# Patient Record
Sex: Male | Born: 1945
Health system: Southern US, Community
[De-identification: ages and names within clinical notes are randomized; demographics above are authoritative.]

## PROBLEM LIST (undated history)

## (undated) DIAGNOSIS — F319 Bipolar disorder, unspecified: Secondary | ICD-10-CM

## (undated) DIAGNOSIS — E785 Hyperlipidemia, unspecified: Secondary | ICD-10-CM

## (undated) DIAGNOSIS — G4730 Sleep apnea, unspecified: Secondary | ICD-10-CM

## (undated) DIAGNOSIS — Z720 Tobacco use: Secondary | ICD-10-CM

## (undated) DIAGNOSIS — I10 Essential (primary) hypertension: Secondary | ICD-10-CM

## (undated) DIAGNOSIS — F329 Major depressive disorder, single episode, unspecified: Secondary | ICD-10-CM

## (undated) DIAGNOSIS — F32A Depression, unspecified: Secondary | ICD-10-CM

## (undated) DIAGNOSIS — I951 Orthostatic hypotension: Secondary | ICD-10-CM

## (undated) DIAGNOSIS — K219 Gastro-esophageal reflux disease without esophagitis: Secondary | ICD-10-CM

## (undated) DIAGNOSIS — R55 Syncope and collapse: Secondary | ICD-10-CM

## (undated) HISTORY — PX: HERNIA REPAIR: SHX51

## (undated) HISTORY — PX: COLONOSCOPY: SHX174

---

## 2007-07-11 ENCOUNTER — Emergency Department (HOSPITAL_COMMUNITY): Admission: EM | Admit: 2007-07-11 | Discharge: 2007-07-11 | Payer: Self-pay | Admitting: Emergency Medicine

## 2013-06-13 ENCOUNTER — Encounter (HOSPITAL_COMMUNITY): Payer: Self-pay | Admitting: Emergency Medicine

## 2013-06-13 ENCOUNTER — Emergency Department (HOSPITAL_COMMUNITY): Payer: Medicare Other

## 2013-06-13 ENCOUNTER — Emergency Department (EMERGENCY_DEPARTMENT_HOSPITAL)
Admission: EM | Admit: 2013-06-13 | Discharge: 2013-06-15 | Disposition: A | Discharge status: Other Acute Inpatient Hospital | Payer: Medicare Other | Source: Home / Self Care | Attending: Emergency Medicine | Admitting: Emergency Medicine

## 2013-06-13 DIAGNOSIS — S93401A Sprain of unspecified ligament of right ankle, initial encounter: Secondary | ICD-10-CM

## 2013-06-13 DIAGNOSIS — T1491XA Suicide attempt, initial encounter: Secondary | ICD-10-CM | POA: Diagnosis present

## 2013-06-13 DIAGNOSIS — F329 Major depressive disorder, single episode, unspecified: Secondary | ICD-10-CM | POA: Diagnosis present

## 2013-06-13 DIAGNOSIS — R45851 Suicidal ideations: Secondary | ICD-10-CM

## 2013-06-13 HISTORY — DX: Essential (primary) hypertension: I10

## 2013-06-13 HISTORY — DX: Depression, unspecified: F32.A

## 2013-06-13 HISTORY — DX: Hyperlipidemia, unspecified: E78.5

## 2013-06-13 HISTORY — DX: Major depressive disorder, single episode, unspecified: F32.9

## 2013-06-13 LAB — RAPID URINE DRUG SCREEN, HOSP PERFORMED
Amphetamines: NOT DETECTED
Barbiturates: NOT DETECTED
Opiates: NOT DETECTED
Tetrahydrocannabinol: NOT DETECTED

## 2013-06-13 LAB — CBC
HCT: 38.8 % — ABNORMAL LOW (ref 39.0–52.0)
Hemoglobin: 13.9 g/dL (ref 13.0–17.0)
MCH: 33.5 pg (ref 26.0–34.0)
MCHC: 35.8 g/dL (ref 30.0–36.0)
MCV: 93.5 fL (ref 78.0–100.0)
Platelets: 155 10*3/uL (ref 150–400)
RBC: 4.15 MIL/uL — ABNORMAL LOW (ref 4.22–5.81)
RDW: 12 % (ref 11.5–15.5)
WBC: 7.6 10*3/uL (ref 4.0–10.5)

## 2013-06-13 LAB — COMPREHENSIVE METABOLIC PANEL
ALT: 15 U/L (ref 0–53)
AST: 24 U/L (ref 0–37)
Albumin: 3.8 g/dL (ref 3.5–5.2)
Alkaline Phosphatase: 42 U/L (ref 39–117)
BUN: 19 mg/dL (ref 6–23)
CO2: 25 mEq/L (ref 19–32)
Calcium: 9.6 mg/dL (ref 8.4–10.5)
Chloride: 105 mEq/L (ref 96–112)
Creatinine, Ser: 0.86 mg/dL (ref 0.50–1.35)
GFR calc Af Amer: >90 mL/min (ref >90)
GFR calc non Af Amer: 89 mL/min — ABNORMAL LOW (ref >90)
Glucose, Bld: 122 mg/dL — ABNORMAL HIGH (ref 70–99)
Potassium: 3.3 mEq/L — ABNORMAL LOW (ref 3.5–5.1)
Sodium: 139 mEq/L (ref 135–145)
Total Bilirubin: 0.6 mg/dL (ref 0.3–1.2)
Total Protein: 6.8 g/dL (ref 6.0–8.3)

## 2013-06-13 LAB — URINALYSIS W MICROSCOPIC + REFLEX CULTURE
Bilirubin Urine: NEGATIVE
Ketones, ur: NEGATIVE mg/dL
Leukocytes, UA: NEGATIVE
Nitrite: NEGATIVE
Protein, ur: NEGATIVE mg/dL
Urobilinogen, UA: 1 mg/dL (ref 0.0–1.0)

## 2013-06-13 LAB — ETHANOL: Alcohol, Ethyl (B): <11 mg/dL (ref 0–11)

## 2013-06-13 LAB — ACETAMINOPHEN LEVEL: Acetaminophen (Tylenol), Serum: <15 ug/mL (ref 10–30)

## 2013-06-13 MED ORDER — LORAZEPAM 1 MG PO TABS: 0.0000 mg | ORAL_TABLET | Freq: Four times a day (QID) | ORAL | Status: AC

## 2013-06-13 MED ORDER — LORAZEPAM 1 MG PO TABS: 0.0000 mg | ORAL_TABLET | Freq: Two times a day (BID) | ORAL | Status: DC

## 2013-06-13 MED ORDER — NICOTINE 21 MG/24HR TD PT24
21.0000 mg | MEDICATED_PATCH | Freq: Every day | TRANSDERMAL | Status: DC
  Administered 2013-06-14 – 2013-06-15 (×2): 21 mg via TRANSDERMAL
  Filled 2013-06-13 (×2): qty 1

## 2013-06-13 MED ORDER — ZOLPIDEM TARTRATE 5 MG PO TABS: 5.0000 mg | ORAL_TABLET | Freq: Every evening | ORAL | Status: DC | PRN

## 2013-06-13 MED ORDER — THIAMINE HCL 100 MG/ML IJ SOLN: 100.0000 mg | Freq: Every day | INTRAMUSCULAR | Status: DC

## 2013-06-13 MED ORDER — IBUPROFEN 200 MG PO TABS
600.0000 mg | ORAL_TABLET | Freq: Three times a day (TID) | ORAL | Status: DC | PRN
  Administered 2013-06-14 – 2013-06-15 (×3): 600 mg via ORAL
  Filled 2013-06-13 (×3): qty 3
  Filled 2013-06-13: qty 1

## 2013-06-13 MED ORDER — VITAMIN B-1 100 MG PO TABS
100.0000 mg | ORAL_TABLET | Freq: Every day | ORAL | Status: DC
  Administered 2013-06-13 – 2013-06-15 (×3): 100 mg via ORAL
  Filled 2013-06-13 (×3): qty 1

## 2013-06-13 MED ORDER — ACETAMINOPHEN 325 MG PO TABS: 650.0000 mg | ORAL_TABLET | ORAL | Status: DC | PRN

## 2013-06-13 MED ORDER — ONDANSETRON HCL 4 MG PO TABS: 4.0000 mg | ORAL_TABLET | Freq: Three times a day (TID) | ORAL | Status: DC | PRN
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
## 2013-06-13 NOTE — ED Notes (Signed)
Patient in blue scrubs and red socks. Patient and 1 bag of belongings both wanded by security.
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
## 2013-06-13 NOTE — Progress Notes (Addendum)
Patient ID: Austin Wells, male   DOB: [birth year], 67 y.o.   MRN: [id number] Pt recommended for Inpt therapy by ACT-homeless veteran-recommend he be referred to [facility] program for homeless vets

## 2013-06-13 NOTE — ED Notes (Signed)
Patient transported to X-ray 

## 2013-06-13 NOTE — ED Notes (Signed)
Pharmacy bedside 
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
## 2013-06-13 NOTE — ED Notes (Signed)
Patient has 1 bag of belongings located behind nurses station across from patient's room. Patient states they do not have anything of value in bag.

## 2013-06-14 DIAGNOSIS — F3130 Bipolar disorder, current episode depressed, mild or moderate severity, unspecified: Secondary | ICD-10-CM

## 2013-06-14 MED ORDER — ZOLPIDEM TARTRATE 10 MG PO TABS
10.0000 mg | ORAL_TABLET | Freq: Every evening | ORAL | Status: DC | PRN
  Administered 2013-06-14: 10 mg via ORAL
  Filled 2013-06-14: qty 1

## 2013-06-14 MED ORDER — DIVALPROEX SODIUM 500 MG PO DR TAB
1000.0000 mg | DELAYED_RELEASE_TABLET | Freq: Every day | ORAL | Status: DC
  Administered 2013-06-14: 1000 mg via ORAL
  Filled 2013-06-14: qty 2

## 2013-06-14 MED ORDER — HYDROCHLOROTHIAZIDE 12.5 MG PO CAPS
12.5000 mg | ORAL_CAPSULE | Freq: Every day | ORAL | Status: DC
  Administered 2013-06-14 – 2013-06-15 (×2): 12.5 mg via ORAL
  Filled 2013-06-14 (×2): qty 1

## 2013-06-14 MED ORDER — SIMVASTATIN 20 MG PO TABS
20.0000 mg | ORAL_TABLET | Freq: Every day | ORAL | Status: DC
  Administered 2013-06-14 – 2013-06-15 (×2): 20 mg via ORAL
  Filled 2013-06-14 (×2): qty 1

## 2013-06-14 MED ORDER — LISINOPRIL 40 MG PO TABS
40.0000 mg | ORAL_TABLET | Freq: Every day | ORAL | Status: DC
  Administered 2013-06-14 – 2013-06-15 (×2): 40 mg via ORAL
  Filled 2013-06-14 (×2): qty 1

## 2013-06-14 MED ORDER — VALPROIC ACID 250 MG PO CAPS
500.0000 mg | ORAL_CAPSULE | Freq: Every morning | ORAL | Status: DC
  Administered 2013-06-14 – 2013-06-15 (×2): 500 mg via ORAL
  Filled 2013-06-14 (×2): qty 2

## 2013-06-14 MED ORDER — SALINE SPRAY 0.65 % NA SOLN
2.0000 | NASAL | Status: DC | PRN
  Administered 2013-06-15 (×3): 2 via NASAL
  Filled 2013-06-14: qty 44

## 2013-06-14 MED ORDER — SERTRALINE HCL 50 MG PO TABS
100.0000 mg | ORAL_TABLET | Freq: Every day | ORAL | Status: DC
  Administered 2013-06-14 – 2013-06-15 (×2): 100 mg via ORAL
  Filled 2013-06-14 (×2): qty 2

## 2013-06-14 NOTE — ED Notes (Signed)
Up to the bathroom 

## 2013-06-14 NOTE — ED Provider Notes (Signed)
Medical screening examination/treatment/procedure(s) were conducted as a shared visit with non-physician practitioner(s) and myself.  I personally evaluated the patient during the encounter Please see my previous note.  Laray Anger, DO 06/14/13 1736
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
## 2013-06-14 NOTE — ED Notes (Signed)
1mg  folic acid PO. 1 multivitamin PO given

## 2013-06-14 NOTE — ED Notes (Signed)
Pt  States he has not had medication for bi polar in over a day. He said by tonight you all will not like me and I will not like myself. Pt also stated he takes medication in the morning for HTN.

## 2013-06-14 NOTE — ED Notes (Signed)
Psychiatrist's in w/ pt

## 2013-06-14 NOTE — BHH Counselor (Signed)
AC Tina and TTS [place] instructed T[facility] to contact [facility] and complete [facility] form and attempt to get pt placed or at least on wait list for [place].  Spoke with AOD Ray at 1028.  No beds.  Per AOD, "don't send that form in until Monday around noon. It won't do any good because the info will be old."  TTS responsed " so you want [place] to wait until Monday to contact you about [facility] transfer for said pt?"  AOD responded "Yes."    TTS to follow up with [facility] Monday morning to discuss transfer.  Form not to be filled out until then so it is not two days old per AOD.

## 2013-06-14 NOTE — ED Notes (Signed)
Up tot he bathroom to shower and change scrubs 

## 2013-06-14 NOTE — ED Notes (Signed)
Pt at NS asking for snack, given cheese and crackers.

## 2013-06-14 NOTE — ED Notes (Signed)
Pt reports that he does not drink on a regular basis, but has been having 1-2 drinks, and 1 in the morning to help him sleep.

## 2013-06-14 NOTE — ED Notes (Signed)
Up to the desk on the phone 

## 2013-06-15 ENCOUNTER — Encounter (HOSPITAL_COMMUNITY): Payer: Self-pay | Admitting: Behavioral Health

## 2013-06-15 ENCOUNTER — Encounter (HOSPITAL_COMMUNITY): Payer: Self-pay | Admitting: Registered Nurse

## 2013-06-15 ENCOUNTER — Inpatient Hospital Stay (HOSPITAL_COMMUNITY)
Admission: AD | Admit: 2013-06-15 | Discharge: 2013-06-19 | DRG: 885 | Disposition: A | Discharge status: Home / Self Care | Payer: Medicare Other | Attending: Psychiatry | Admitting: Psychiatry

## 2013-06-15 DIAGNOSIS — T1491XA Suicide attempt, initial encounter: Secondary | ICD-10-CM | POA: Diagnosis present

## 2013-06-15 DIAGNOSIS — Z79899 Other long term (current) drug therapy: Secondary | ICD-10-CM

## 2013-06-15 DIAGNOSIS — F319 Bipolar disorder, unspecified: Secondary | ICD-10-CM | POA: Diagnosis present

## 2013-06-15 DIAGNOSIS — R45851 Suicidal ideations: Secondary | ICD-10-CM

## 2013-06-15 DIAGNOSIS — F329 Major depressive disorder, single episode, unspecified: Secondary | ICD-10-CM | POA: Diagnosis present

## 2013-06-15 DIAGNOSIS — I10 Essential (primary) hypertension: Secondary | ICD-10-CM | POA: Diagnosis present

## 2013-06-15 DIAGNOSIS — E785 Hyperlipidemia, unspecified: Secondary | ICD-10-CM | POA: Diagnosis present

## 2013-06-15 DIAGNOSIS — F3160 Bipolar disorder, current episode mixed, unspecified: Principal | ICD-10-CM

## 2013-06-15 MED ORDER — VITAMIN B-1 100 MG PO TABS
100.0000 mg | ORAL_TABLET | Freq: Every day | ORAL | Status: DC
  Administered 2013-06-16 – 2013-06-19 (×4): 100 mg via ORAL
  Filled 2013-06-15 (×6): qty 1

## 2013-06-15 MED ORDER — LISINOPRIL 40 MG PO TABS
40.0000 mg | ORAL_TABLET | Freq: Every day | ORAL | Status: DC
  Administered 2013-06-16 – 2013-06-19 (×4): 40 mg via ORAL
  Filled 2013-06-15 (×6): qty 1

## 2013-06-15 MED ORDER — MAGNESIUM HYDROXIDE 400 MG/5ML PO SUSP: 30.0000 mL | Freq: Every day | ORAL | Status: DC | PRN

## 2013-06-15 MED ORDER — ACETAMINOPHEN 325 MG PO TABS
650.0000 mg | ORAL_TABLET | Freq: Four times a day (QID) | ORAL | Status: DC | PRN
  Administered 2013-06-16 – 2013-06-19 (×7): 650 mg via ORAL
  Filled 2013-06-15: qty 2

## 2013-06-15 MED ORDER — SALINE SPRAY 0.65 % NA SOLN
2.0000 | NASAL | Status: DC | PRN
  Administered 2013-06-16 – 2013-06-19 (×3): 2 via NASAL
  Filled 2013-06-15: qty 44

## 2013-06-15 MED ORDER — THIAMINE HCL 100 MG/ML IJ SOLN: 100.0000 mg | Freq: Every day | INTRAMUSCULAR | Status: DC

## 2013-06-15 MED ORDER — TRAZODONE HCL 50 MG PO TABS
50.0000 mg | ORAL_TABLET | Freq: Every evening | ORAL | Status: DC | PRN
  Administered 2013-06-15 – 2013-06-17 (×3): 50 mg via ORAL
  Filled 2013-06-15 (×4): qty 1

## 2013-06-15 MED ORDER — NICOTINE 21 MG/24HR TD PT24
21.0000 mg | MEDICATED_PATCH | Freq: Every day | TRANSDERMAL | Status: DC
  Administered 2013-06-16 – 2013-06-19 (×4): 21 mg via TRANSDERMAL
  Filled 2013-06-15 (×7): qty 1

## 2013-06-15 MED ORDER — ALUM & MAG HYDROXIDE-SIMETH 200-200-20 MG/5ML PO SUSP: 30.0000 mL | ORAL | Status: DC | PRN

## 2013-06-15 MED ORDER — SIMVASTATIN 20 MG PO TABS
20.0000 mg | ORAL_TABLET | Freq: Every day | ORAL | Status: DC
  Administered 2013-06-16 – 2013-06-18 (×3): 20 mg via ORAL
  Filled 2013-06-15 (×5): qty 1

## 2013-06-15 MED ORDER — HYDROCHLOROTHIAZIDE 12.5 MG PO CAPS
12.5000 mg | ORAL_CAPSULE | Freq: Every day | ORAL | Status: DC
  Administered 2013-06-16 – 2013-06-19 (×4): 12.5 mg via ORAL
  Filled 2013-06-15 (×6): qty 1

## 2013-06-15 MED ORDER — DIVALPROEX SODIUM 500 MG PO DR TAB
1000.0000 mg | DELAYED_RELEASE_TABLET | Freq: Every day | ORAL | Status: DC
  Administered 2013-06-15 – 2013-06-16 (×2): 1000 mg via ORAL
  Filled 2013-06-15 (×5): qty 2

## 2013-06-15 MED ORDER — VALPROIC ACID 250 MG PO CAPS
500.0000 mg | ORAL_CAPSULE | Freq: Every morning | ORAL | Status: DC
  Filled 2013-06-15: qty 2

## 2013-06-15 NOTE — ED Notes (Signed)
Snack given.

## 2013-06-15 NOTE — ED Notes (Addendum)
Pt ambulatory to [facility] W/ [facility], 1 BAG OF BELONGINGS given to [employer]
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
## 2013-06-15 NOTE — ED Notes (Signed)
Up to the bathroom 

## 2013-06-15 NOTE — ED Notes (Signed)
Pt is aware that he will transfer to [facility] after supper
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
## 2013-06-15 NOTE — ED Notes (Signed)
Pt has showered  And changed scrubs

## 2013-06-15 NOTE — ED Notes (Signed)
[employer] here to transport 

## 2013-06-15 NOTE — ED Notes (Signed)
Up to the desk ambulatory w/o difficulty. Talking/jioking w/ other patient.  Pt  reports that his ankle is hurting worse 7/10.

## 2013-06-15 NOTE — ED Notes (Signed)
Up to the desk on the phone 

## 2013-06-15 NOTE — ED Notes (Signed)
[facility] NP and psych MD in w/ pt

## 2013-06-15 NOTE — Progress Notes (Signed)
Follow up Progress Note: Face to face interview and consult with   Austin BaccoApril 15, [id number]  Subjective:  Patient states that he was trying to kill himself when he tied the belt around his neck.  Patient states that he is feeling better today.  Patient states since he is taking his medication he is feeling better. Patient is unsure [facility].  Patient is homeless and lives in his car, but states that he will be able to go home with his ex-wife until he is stable.  Patient denies suicidal ideation, homicidal ideation, psychosis, and paranoia at this time.   Will continue to monitor patient overnight.  Will be able to send forms to the [place] on Monday morning.  Once [facility] has been [facility] determine if outpatient of inpatient treatment is better for this patient.  Will continue to monitor for improvement in depression and Suicidal ideation.     Current Medication Current facility-administered medications:acetaminophen (TYLENOL) tablet 650 mg, 650 mg, Oral, Q4H PRN, Tatyana A Kirichenko, PA-C;  divalproex (DEPAKOTE) DR tablet 1,000 mg, 1,000 mg, Oral, QHS, Earney Navy, NP, 1,000 mg at 06/14/13 2249;  hydrochlorothiazide (MICROZIDE) capsule 12.5 mg, 12.5 mg, Oral, Daily, Earney Navy, NP, 12.5 mg at 06/15/13 0945 ibuprofen (ADVIL,MOTRIN) tablet 600 mg, 600 mg, Oral, Q8H PRN, Tatyana A Kirichenko, PA-C, 600 mg at 06/15/13 0804;  lisinopril (PRINIVIL,ZESTRIL) tablet 40 mg, 40 mg, Oral, Daily, Earney Navy, NP, 40 mg at 06/15/13 0945;  LORazepam (ATIVAN) tablet 0-4 mg, 0-4 mg, Oral, Q6H, Tatyana A Kirichenko, PA-C;  LORazepam (ATIVAN) tablet 0-4 mg, 0-4 mg, Oral, Q12H, Tatyana A Kirichenko, PA-C nicotine (NICODERM CQ - dosed in mg/24 hours) patch 21 mg, 21 mg, Transdermal, Daily, Tatyana A Kirichenko, PA-C, 21 mg at 06/15/13 0943;  ondansetron (ZOFRAN) tablet 4 mg, 4 mg, Oral, Q8H PRN, Tatyana A Kirichenko, PA-C;  sertraline (ZOLOFT) tablet 100 mg, 100  mg, Oral, Daily, Earney Navy, NP, 100 mg at 06/15/13 0945;  simvastatin (ZOCOR) tablet 20 mg, 20 mg, Oral, q1800, Earney Navy, NP, 20 mg at 06/14/13 1825 sodium chloride (OCEAN) 0.65 % nasal spray 2 spray, 2 spray, Each Nare, PRN, Raeford Razor, MD, 2 spray at 06/15/13 1210;  thiamine (B-1) injection 100 mg, 100 mg, Intravenous, Daily, Tatyana A Kirichenko, PA-C;  thiamine (VITAMIN B-1) tablet 100 mg, 100 mg, Oral, Daily, Tatyana A Kirichenko, PA-C, 100 mg at 06/15/13 0945;  valproic acid (DEPAKENE) 250 MG capsule 500 mg, 500 mg, Oral, q morning - 10a, Josephine C Onuoha, NP, 500 mg at 06/15/13 0945 zolpidem (AMBIEN) tablet 10 mg, 10 mg, Oral, QHS PRN, Earney Navy, NP, 10 mg at 06/14/13 2249 Current outpatient prescriptions:divalproex (DEPAKOTE ER) 500 MG 24 hr tablet, Take 500-1,000 mg by mouth daily. 1 tablet in the morning and 2 tablets at bedtime, Disp: , Rfl: ;  fish oil-omega-3 fatty acids 1000 MG capsule, Take 2 g by mouth daily., Disp: , Rfl: ;  hydrochlorothiazide (HYDRODIURIL) 25 MG tablet, Take 12.5 mg by mouth daily. , Disp: , Rfl: ;  lisinopril (PRINIVIL,ZESTRIL) 40 MG tablet, Take 40 mg by mouth daily., Disp: , Rfl:  Multiple Vitamin (MULTIVITAMIN WITH MINERALS) TABS tablet, Take 1 tablet by mouth daily., Disp: , Rfl: ;  pravastatin (PRAVACHOL) 40 MG tablet, Take 40 mg by mouth every evening. , Disp: , Rfl: ;  salicylic acid-lactic acid 17 % external solution, Apply 1 application topically daily as needed (for wart). Applied to wart,  Disp: , Rfl: ;  sertraline (ZOLOFT) 100 MG tablet, Take 100 mg by mouth every evening. , Disp: , Rfl:  sodium chloride (OCEAN) 0.65 % nasal spray, Place 2 sprays into the nose 3 (three) times daily as needed for congestion., Disp: , Rfl: ;  traMADol (ULTRAM) 50 MG tablet, Take 50 mg by mouth every 6 (six) hours as needed for pain., Disp: , Rfl: ;  zolpidem (AMBIEN) 10 MG tablet, Take 10 mg by mouth at bedtime as needed for sleep., Disp: , Rfl:   Assessment @ Axis I: Bipolar, Depressed Axis II: Deferred Axis III:  Past Medical History  Diagnosis Date  . Hyperlipidemia   . Depression   . Hypertension    Axis IV: economic problems, housing problems, occupational problems, other psychosocial or environmental problems, problems related to social environment and problems with primary support group Axis V: 51-60 moderate symptoms  Plan  Recommendation/Disposition:  Monitor overnight until CW hears for [facility] and re-evaluate for disposition after.    Patient informs that he is unable to get to [facility] for [facility] at the [facility] even if accepted.  Patient accepted at [facility] [address] . 1. Admit for crisis management and stabilization.  2. Review and initiate  medications pertinent to patient illness and treatment.  3. Medication management to reduce current symptoms to base line and improve the         patient's overall level of functioning.   Shuvon Rankin, FNP-BC  I have personally seen the patient and agreed with the findings and involved in the treatment plan. Thresa Ross, MD

## 2013-06-15 NOTE — Tx Team (Signed)
Initial Interdisciplinary Treatment Plan  PATIENT STRENGTHS: (choose at least two) Active sense of humor Average or above average intelligence Communication skills Physical Health Religious Affiliation Work skills  PATIENT STRESSORS: Financial difficulties Marital or family conflict Medication change or noncompliance Substance abuse   PROBLEM LIST: Problem List/Patient Goals Date to be addressed Date deferred Reason deferred Estimated date of resolution  Risky behaviors 06/15/2013   D/C  Medication noncompliance 06/15/2013   D/C  Substance abuse/ETOH 06/15/2013   D/C                                       DISCHARGE CRITERIA:  Ability to meet basic life and health needs Adequate post-discharge living arrangements Improved stabilization in mood, thinking, and/or behavior Motivation to continue treatment in a less acute level of care Reduction of life-threatening or endangering symptoms to within safe limits Verbal commitment to aftercare and medication compliance  PRELIMINARY DISCHARGE PLAN: Outpatient therapy  PATIENT/FAMIILY INVOLVEMENT: This treatment plan has been presented to and reviewed with the patient, Austin Wells, and/or family member.  The patient and family have been given the opportunity to ask questions and make suggestions.  [name], [name] Joy 06/15/2013, 8:53 PM
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
## 2013-06-16 DIAGNOSIS — F3160 Bipolar disorder, current episode mixed, unspecified: Principal | ICD-10-CM

## 2013-06-16 DIAGNOSIS — F319 Bipolar disorder, unspecified: Secondary | ICD-10-CM | POA: Diagnosis present

## 2013-06-16 MED ORDER — DIVALPROEX SODIUM 500 MG PO DR TAB
500.0000 mg | DELAYED_RELEASE_TABLET | Freq: Every day | ORAL | Status: DC
  Administered 2013-06-16 – 2013-06-17 (×2): 500 mg via ORAL
  Filled 2013-06-16 (×4): qty 1

## 2013-06-16 NOTE — BHH Group Notes (Signed)
[facility] LCSW Aftercare Discharge Planning Group Note   06/16/2013 12:51 PM    Participation Quality:  Appropraite  Mood/Affect:  Appropriate  Depression Rating:  0  Anxiety Rating:  3  Thoughts of Suicide:  No  Will you contract for safety?   NA  Current AVH:  No  Plan for Discharge/Comments:  Patient attending discharge planning group and actively participated in group.  He advised of need for referral for outpatient services.  He shared he plans to stay with a friend at discharge.  CSW provided all participants with daily workbook and information on services offered by [facility].   Transportation Means: Patient has transportation.   Supports:  Patient has a support system.   Mishel Sans, Joesph July

## 2013-06-16 NOTE — Progress Notes (Signed)
Adult Psychoeducational Group Note  Date:  06/16/2013 Time:  10:20 PM  Group Topic/Focus:  Wrap-Up Group:   The focus of this group is to help patients review their daily goal of treatment and discuss progress on daily workbooks.  Participation Level:  Active  Participation Quality:  Appropriate, Attentive and Supportive  Affect:  Appropriate  Cognitive:  Alert and Appropriate  Insight: Appropriate and Good  Engagement in Group:  Engaged and Improving  Modes of Intervention:  Discussion and Support  Additional Comments:  Pt described an ideal day as laying with his dog in a hammock. Pt gave positive and affirming feedback to pts who are discharging in the am.  Reynolds Bowl 06/16/2013, 10:20 PM

## 2013-06-16 NOTE — BHH Counselor (Signed)
Adult Comprehensive Assessment  Patient ID: Austin Wells, male   DOB: 1946-02-21, 67 y.o.   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 Family Relationships: Patient and wife separated two months ago Surveyor, quantity / Lack of resources (include bankruptcy): None Housing / Lack of housing: Patient will stay with a friend Physical health (include injuries & life threatening diseases):  Bad ankle Social relationships: None Substance abuse: Drinking some alcohol but not to excess Bereavement / Loss: None  Living/Environment/Situation:  Living Arrangements: Non-relatives/Friends Living conditions (as described by patient or guardian): Okay How long has patient lived in current situation?: Patient will be staying with friends on discharge. What is atmosphere in current home: Comfortable  Family History:  Marital status: Separated Number of Years Married: 1 Separated, when?: Patient and wife separated for two months What types of issues is patient dealing with in the relationship?: Wife concerned about patient irratic behaviors Does patient have children?: Yes How many children?: 2 How is patient's relationship with their children?: Estranged from son in [place] and close to other son in [place]  Childhood History:  By whom was/is the patient raised?: Both parents Additional childhood history information: Good childhood.  Patient reports being in a hospital for three and a half years due to having TB Description of patient's relationship with caregiver when they were a child: Did not see eye to eye wiyth father but fine with mother Patient's description of current relationship with people who raised him/her: Good relationship with mother Does patient have siblings?: Yes Number of Siblings: 1 Description of patient's current relationship  with siblings: Not a close relationship with sister Did patient suffer any verbal/emotional/physical/sexual abuse as a child?: No Did patient suffer from severe childhood neglect?: No Has patient ever been sexually abused/assaulted/raped as an adolescent or adult?: No Was the patient ever a victim of a crime or a disaster?: No Witnessed domestic violence?: No Has patient been effected by domestic violence as an adult?: No  Education:  Highest grade of school patient has completed: [employer] Currently a student?: No Learning disability?: No  Employment/Work Situation:   Employment situation: On disability (Patient is retired but works a little) Why is patient on disability: Bipolar How long has patient been on disability: 2009 Patient's job has been impacted by current illness: No What is the longest time patient has a held a job?: Five years 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, [employer]ing [employer] Has patient ever served in Buyer, retail?: No 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 Resources:   Financial resources: Receives SSI Does patient have a Lawyer or guardian?: No  Alcohol/Substance Abuse:   What has been your use of drugs/alcohol within the last 12 months?: Two small bottles (airplane size) bottle of liquor over the past two months If attempted suicide, did drugs/alcohol play a role in this?: No Alcohol/Substance Abuse Treatment Hx: Past Tx, Inpatient If yes, describe [facility] [place] [facility] Has alcohol/substance abuse ever caused legal problems?: No  Social Support System:   Forensic psychologist System: None Type of faith/religion: None How does patient's faith help to cope with current illness?: N/A  Leisure/Recreation:   Leisure and Hobbies: Working with hands - makes cane and walking sticks  Strengths/Needs:   What things does the patient do well?: 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 In what  areas does patient struggle / problems for patient: Taking care of self - emotionally  Discharge Plan:   Does patient have access to transportation?: Yes Will patient be returning to same living situation after discharge?: No Plan for living situation after discharge: Patient will be living with a friend Currently receiving community mental health services: Yes (From Whom) ([facility] [place]) If no, would patient like referral for services when discharged?: Yes (What county?) [facility] [place]) Does patient have financial barriers related to discharge medications?: No  Summary/Recommendations:  Austin Wells is a 67 year old male admitted with Bipolar Disorder.  He will benefit from crisis stabilization, evaluation for medication, psycho-education groups for coping skills development, group therapy and case management for discharge planning.     Austin Wells, Austin Wells. 06/16/2013
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
## 2013-06-16 NOTE — Progress Notes (Signed)
NUTRITION ASSESSMENT  Pt identified as at risk on the Malnutrition Screen Tool  INTERVENTION: 1. Educated patient on the importance of nutrition and encouraged intake of food and beverages. 2. Discussed weight goals. 3. Supplements: continue thiamine daily.    NUTRITION DIAGNOSIS: Unintentional weight loss related to sub-optimal intake as evidenced by pt report.   Goal: Pt to meet >/= 90% of their estimated nutrition needs.  Monitor:  PO intake  Assessment:  Patient admitted after SA.  Hx of bipolar.  Homeless for the past 5 weeks after argument wife and living in his car.  Decreased appetite with 45 lb weight loss in the past year.  Patient reported weight loss was intentional secondary to MD's recommendations.  Probable further weight loss over the past weeks secondary to homeless.  Patient reports very good intake currently.    67 y.o. male  Height: Ht Readings from Last 1 Encounters:  06/15/13 5' 10.08" (1.78 m)    Weight: Wt Readings from Last 1 Encounters:  06/15/13 187 lb (84.823 kg)    Weight Hx: Wt Readings from Last 10 Encounters:  06/15/13 187 lb (84.823 kg)    BMI:  Body mass index is 26.77 kg/(m^2). Pt meets criteria for overweight based on current BMI.  Estimated Nutritional Needs: Kcal: 25-30 kcal/kg Protein: > 1 gram protein/kg Fluid: 1 ml/kcal  Diet Order: General Pt is also offered choice of unit snacks mid-morning and mid-afternoon.  Pt is eating as desired.   Lab results and medications reviewed.   Oran Rein, RD, LDN Clinical Inpatient Dietitian Pager:  [phone] Weekend and after hours pager:  [phone]

## 2013-06-16 NOTE — Progress Notes (Signed)
The focus of this group is to help patients review their daily goal of treatment and discuss progress on daily workbooks.  Cherry did not attend wrap up group tonight.
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
## 2013-06-16 NOTE — Progress Notes (Signed)
D: Patient resting in bed with eyes closed.  Respirations even and unlabored.  Patient appears to be in no apparent distress. A: Staff to monitor Q 15 mins for safety.   R:Patient remains safe on the unit.  

## 2013-06-16 NOTE — [facility] Suicide Risk Assessment (Signed)
Suicide Risk Assessment  Admission Assessment     Nursing information obtained from:  Patient Demographic factors:  Age 65 or older;Divorced or widowed;Low socioeconomic status;Living alone;Unemployed;Caucasian Current Mental Status:  Suicidal ideation indicated by patient Loss Factors:  Loss of significant relationship;Financial problems / change in socioeconomic status Historical Factors:  Impulsivity Risk Reduction Factors:  Religious beliefs about death  CLINICAL FACTORS:   Severe Anxiety and/or Agitation Bipolar Disorder:   Depressive phase Depression:   Anhedonia Comorbid alcohol abuse/dependence Hopelessness Impulsivity Insomnia Recent sense of peace/wellbeing Severe Alcohol/Substance Abuse/Dependencies Unstable or Poor Therapeutic Relationship Previous Psychiatric Diagnoses and Treatments Medical Diagnoses and Treatments/Surgeries  COGNITIVE FEATURES THAT CONTRIBUTE TO RISK:  Closed-mindedness Loss of executive function Polarized thinking Thought constriction (tunnel vision)    SUICIDE RISK:   Moderate:  Frequent suicidal ideation with limited intensity, and duration, some specificity in terms of plans, no associated intent, good self-control, limited dysphoria/symptomatology, some risk factors present, and identifiable protective factors, including available and accessible social support.  PLAN OF CARE: Admit in one week and emergently from the [facility] long emergency department for bipolar disorder with the recent depression and suicidal attempt. Patient may crisis stabilization medication management.  I certify that inpatient services furnished can reasonably be expected to improve the patient's condition.   Nehemiah Settle., MD 06/16/2013, 2:26 PM

## 2013-06-16 NOTE — Progress Notes (Signed)
Recreation Therapy Notes  Date: 08.25.2014 Time: 3:00pm Location: 500 Hall Dayroom  Group Topic: Stress Management  Goal Area(s) Addresses:  Patient will verbalize importance of using healthy stress management.  Patient will identify stress management technique of choice.  Behavioral Response: Attentive, Appropriate    Intervention: Stress Management  Activity: Activity: Deep Breathing & Progressive Muscle Relaxation. LRT instructed patients on completing Progressive Muscle Relaxation. LRT instructed patients on completing deep belly breathing.   Education:  Pharmacologist, Discharge Planning  Education Outcome: Acknowledges understanding  Clinical Observations/Feedback: Patient actively participated in both deep breathing and progressive muscle relaxation. Patient expressed no discomfort during either technique. Patient made no contributions to wrap up discussion, but appeared to actively listen as he maintained appropriate eye contact with speaker, as well as nodding in agreement with points of interest.    Jearl Klinefelter, LRT/CTRS  Jearl Klinefelter 06/16/2013 4:08 PM

## 2013-06-17 LAB — VALPROIC ACID LEVEL: Valproic Acid Lvl: 39.3 ug/mL — ABNORMAL LOW (ref 50.0–100.0)

## 2013-06-17 MED ORDER — DIVALPROEX SODIUM 500 MG PO DR TAB
1000.0000 mg | DELAYED_RELEASE_TABLET | Freq: Two times a day (BID) | ORAL | Status: DC
  Administered 2013-06-17 – 2013-06-19 (×4): 1000 mg via ORAL
  Filled 2013-06-17 (×6): qty 2

## 2013-06-17 NOTE — Progress Notes (Signed)
Adult Psychoeducational Group Note  Date:  06/17/2013 Time:  11:16 PM  Group Topic/Focus:  Goals Group:   The focus of this group is to help patients establish daily goals to achieve during treatment and discuss how the patient can incorporate goal setting into their daily lives to aide in recovery.  Participation Level:  Active  Participation Quality:  Appropriate  Affect:  Appropriate  Cognitive:  Appropriate  Insight: Appropriate  Engagement in Group:  Engaged  Modes of Intervention:  Discussion  Additional Comments:  Pt stated that the day went well, groups, other patients, and the good atmosphere that everyone else provided made his day better.  Terie Purser R 06/17/2013, 11:16 PM

## 2013-06-17 NOTE — Progress Notes (Signed)
Recreation Therapy Notes  Date: 08.26.2014 Time: 2:45pm Location: 500 Hall Dayroom  Group Topic: Animal Assisted Activities  Goal Area(s) Addresses:  Patient will interact appropriately with dog team.    Behavioral Response: Appropriate, Attentive, Engaged  Education: Coping Skill   Education Outcome: Acknowledges understanding  Clinical Observations/Feedback: Dog Team: [employer]. Patient interacted appropriately with peer, dog team and LRT.    Marykay Lex Talene Glastetter, LRT/CTRS  Jearl Klinefelter 06/17/2013 4:49 PM

## 2013-06-17 NOTE — Progress Notes (Signed)
Patient ID: Austin Wells, male   DOB: [birth year], 67 y.o.   MRN: [id number] D-Patient reports he slept well and that his appetite is good.  His ability to pay attention is also good.  He rates his depression and hopelessness at 1/10 and his anxiety at 0.  He denies thoughts of self harm.  He has ankle pain but no other complaints.  He is attending groups and interacting with peers and staff.  He is asking when he can expect to be discharged and says he plans to take medication as ordered after D/C

## 2013-06-17 NOTE — Progress Notes (Signed)
Adult Psychoeducational Group Note  Date:  06/17/2013 Time:  12:34 PM  Group Topic/Focus:  Recovery Goals:   The focus of this group is to identify appropriate goals for recovery and establish a plan to achieve them.  Participation Level:  Active  Participation Quality:  Appropriate, Attentive and Supportive  Affect:  Appropriate  Cognitive:  Alert and Appropriate  Insight: Good  Engagement in Group:  Engaged and Supportive  Modes of Intervention:  Discussion, Socialization and Support  Additional Comments:  Pt came to group and was very supportive of the other group members. Pt shared that the negative relationship with his ex and his pride are standing between him and recovery. Pt plans on changing this by working on his relationship with his ex and putting himself first and reevaluating his attitudes and going to therapy and using the resources at the [place].   Cathlean Cower 06/17/2013, 12:34 PM

## 2013-06-17 NOTE — Progress Notes (Signed)
The focus of this group is to educate the patient on the purpose and policies of crisis stabilization and provide a format to answer questions about their admission.  The group details unit policies and expectations of patients while admitted.  Patient attended 0900 nurse education orientation group this morning.  Patient actively participated, appropriate affect, alert, appropriate insight and engagement.  Patient will work on goals for discharge today. 

## 2013-06-17 NOTE — Progress Notes (Signed)
[facility] MD Progress Note  06/17/2013 2:18 PM Austin Wells  MRN:  [id number]  Subjective:  "I'm wonderful!" I know I'm wonderful because I'm attending groups, I'm getting in touch with my feelings, and I'm getting the help I need. Patient has been up and active in the unit milieu and attending groups. He has been euphoric and grandiose today in his behaviors. He has been more manic but not depressed or anxious. He reports depression as a 1/10 and and his anxiety is a 1/10.   Diagnosis:   DSM5: Schizophrenia Disorders:  Obsessive-Compulsive Disorders:  Trauma-Stressor Disorders:  Substance/Addictive Disorders:  Depressive Disorders: Disruptive Mood Dysregulation Disorder (296.99)  AXIS I: Bipolar, mixed  AXIS II: Deferred  AXIS III:  Past Medical History   Diagnosis  Date   .  Hyperlipidemia    .  Depression    .  Hypertension     AXIS IV: economic problems, housing problems, occupational problems, other psychosocial or environmental problems, problems related to social environment and problems with primary support group  AXIS V: 41-50 serious symptoms  ADL's:  Intact  Sleep: Good  Appetite:  "3 squares a day."  Suicidal Ideation:  Continue to be suicidal with no plan or intent and contract for safety Homicidal Ideation:  denies AEB (as evidenced by):  Psychiatric Specialty Exam: Review of Systems  Constitutional: Negative.  Negative for fever, chills, weight loss, malaise/fatigue and diaphoresis.  HENT: Negative for congestion and sore throat.   Eyes: Negative for blurred vision, double vision and photophobia.  Respiratory: Negative for cough, shortness of breath and wheezing.   Cardiovascular: Negative for chest pain, palpitations and PND.  Gastrointestinal: Negative for heartburn, nausea, vomiting, abdominal pain, diarrhea and constipation.  Musculoskeletal: Positive for joint pain (ankle pain). Negative for myalgias and falls.  Neurological: Negative for dizziness,  tingling, tremors, sensory change, speech change, focal weakness, seizures, loss of consciousness, weakness and headaches.  Endo/Heme/Allergies: Negative for polydipsia. Does not bruise/bleed easily.  Psychiatric/Behavioral: Negative for depression, suicidal ideas, hallucinations, memory loss and substance abuse. The patient is not nervous/anxious and does not have insomnia.     Blood pressure 126/83, pulse 70, temperature 96.8 F (36 C), temperature source Oral, resp. rate 17, height 5' 10.08" (1.78 m), weight 84.823 kg (187 lb).Body mass index is 26.77 kg/(m^2).  General Appearance: Casual  Eye Contact::  Fair  Speech:  Pressured  Volume:  Normal  Mood:  Euthymic  Affect:  grandiose  Thought Process:  Goal Directed  Orientation:  Full (Time, Place, and Person)  Thought Content:  WDL  Suicidal Thoughts:  Yes.  without intent/plan  Homicidal Thoughts:  No  Memory:  Immediate;   Fair  Judgement:  Impaired  Insight:  Lacking  Psychomotor Activity:  Restlessness  Concentration:  Fair  Recall:  Fair  Akathisia:  No  Handed:  Right  AIMS (if indicated):     Assets:  Communication Skills Desire for Improvement  Sleep:  Number of Hours: 5   Current Medications: Current Facility-Administered Medications  Medication Dose Route Frequency Provider Last Rate Last Dose  . acetaminophen (TYLENOL) tablet 650 mg  650 mg Oral Q6H PRN Shuvon Rankin, NP   650 mg at 06/17/13 0757  . alum & mag hydroxide-simeth (MAALOX/MYLANTA) 200-200-20 MG/5ML suspension 30 mL  30 mL Oral Q4H PRN Shuvon Rankin, NP      . divalproex (DEPAKOTE) DR tablet 1,000 mg  1,000 mg Oral QHS Shuvon Rankin, NP   1,000 mg at 06/16/13 2133  .  divalproex (DEPAKOTE) DR tablet 500 mg  500 mg Oral Daily Nehemiah Settle, MD   500 mg at 06/17/13 0756  . hydrochlorothiazide (MICROZIDE) capsule 12.5 mg  12.5 mg Oral Daily Shuvon Rankin, NP   12.5 mg at 06/17/13 0755  . lisinopril (PRINIVIL,ZESTRIL) tablet 40 mg  40 mg Oral  Daily Shuvon Rankin, NP   40 mg at 06/17/13 0755  . magnesium hydroxide (MILK OF MAGNESIA) suspension 30 mL  30 mL Oral Daily PRN Shuvon Rankin, NP      . nicotine (NICODERM CQ - dosed in mg/24 hours) patch 21 mg  21 mg Transdermal Daily Shuvon Rankin, NP   21 mg at 06/17/13 0658  . simvastatin (ZOCOR) tablet 20 mg  20 mg Oral q1800 Shuvon Rankin, NP   20 mg at 06/16/13 1658  . sodium chloride (OCEAN) 0.65 % nasal spray 2 spray  2 spray Each Nare PRN Shuvon Rankin, NP   2 spray at 06/16/13 1308  . thiamine (VITAMIN B-1) tablet 100 mg  100 mg Oral Daily Shuvon Rankin, NP   100 mg at 06/17/13 0756   Or  . thiamine (B-1) injection 100 mg  100 mg Intravenous Daily Shuvon Rankin, NP      . traZODone (DESYREL) tablet 50 mg  50 mg Oral QHS PRN,MR X 1 Court Joy, PA-C   50 mg at 06/16/13 2133    Lab Results:  Results for orders placed during the hospital encounter of 06/15/13 (from the past 48 hour(s))  VALPROIC ACID LEVEL     Status: Abnormal   Collection Time    06/17/13  6:10 AM      Result Value Range   Valproic Acid Lvl 39.3 (*) 50.0 - 100.0 ug/mL   Comment: Performed at [facility]  TSH     Status: None   Collection Time    06/17/13  6:10 AM      Result Value Range   TSH 1.569  0.350 - 4.500 uIU/mL   Comment: Performed at [facility]    Physical Findings: AIMS: Facial and Oral Movements Muscles of Facial Expression: None, normal Lips and Perioral Area: None, normal Jaw: None, normal Tongue: None, normal,Extremity Movements Upper (arms, wrists, hands, fingers): None, normal Lower (legs, knees, ankles, toes): None, normal, Trunk Movements Neck, shoulders, hips: None, normal, Overall Severity Severity of abnormal movements (highest score from questions above): None, normal Incapacitation due to abnormal movements: None, normal Patient's awareness of abnormal movements (rate only patient's report): No Awareness, Dental Status Current problems with teeth and/or  dentures?: No Does patient usually wear dentures?: No  CIWA:    COWS:  COWS Total Score: 0  Treatment Plan Summary: Daily contact with patient to assess and evaluate symptoms and progress in treatment Medication management  Plan: 1. Patient VPA acid level is sub-therapeutic and Will increase Depakote to 1000mg  Po BID for better control of his manic symptoms. 2. Continue other plans as directed. 3. Anticipate D/C to friend's house upon discharge. 4. ELOS: 2-3 days.  Medical Decision Making Problem Points:  Established problem, stable/improving (1) Data Points:  Review of medication regiment & side effects (2)  I certify that inpatient services furnished can reasonably be expected to improve the patient's condition.   Rona Ravens. [place] RPAC 2:28 PM 06/17/2013  Reviewed the information documented and agree with the treatment plan.  Nehemiah Settle., MD 06/17/2013 9:30 PM

## 2013-06-17 NOTE — [facility] Group Notes (Signed)
[facility] LCSW Group Therapy      Feelings About Diagnosis 1:15 - 2:30 PM         06/17/2013  12:52 PM    Type of Therapy:  Group Therapy  Participation Level:  Active  Participation Quality:  Appropriate  Affect:  Appropriate  Cognitive:  Alert and Appropriate  Insight:  Developing/Improving and Engaged  Engagement in Therapy:  Developing/Improving and Engaged  Modes of Intervention:  Discussion, Education, Exploration, Problem-Solving, Rapport Building, Support  Summary of Progress/Problems:  Patient actively participated in group. Patient discussed past and present diagnosis and the effects it has had on  life.  Patient talked about family and society being judgmental and the stigma associated with having a mental health diagnosis.  Patient shared he has to take responsibility for things going bad for him because he did not take his medications as needed.  Wynn Banker 06/17/2013  12:52 PM

## 2013-06-17 NOTE — Clinical Social Work Note (Signed)
Writer spoke with patient's wife advised patient is not to return to her home at discharge.  She reports she has had enough.

## 2013-06-17 NOTE — Progress Notes (Signed)
Patient ID: Austin Wells, male   DOB: 10-Jun-1946, 67 y.o.   MRN: [id number] Patient currently asleep; no s/s of distress noted at this time. Respirations regular and unlabored.

## 2013-06-17 NOTE — [facility] Suicide Risk Assessment (Signed)
[facility] INPATIENT:  Family/Significant Other Suicide Prevention Education  Suicide Prevention Education:  Education Completed; Austin Wells, Wife, [phone];  has been identified by the patient as the family member/significant other with whom the patient will be residing, and identified as the person(s) who will aid the patient in the event of a mental health crisis (suicidal ideations/suicide attempt).  With written consent from the patient, the family member/significant other has been provided the following suicide prevention education, prior to the and/or following the discharge of the patient.  The suicide prevention education provided includes the following:  Suicide risk factors  Suicide prevention and interventions  National Suicide Hotline telephone number  [facility] assessment telephone number  [facility] Emergency Assistance 911  [facility] and/or Residential Mobile Crisis Unit telephone number  Request made of family/significant other to:  Remove weapons (e.g., guns, rifles, knives), all items previously/currently identified as safety concern.  Wife does not know if patient still has guns.  She reports had guns in June.  Remove drugs/medications (over-the-counter, prescriptions, illicit drugs), all items previously/currently identified as a safety concern.  The family member/significant other verbalizes understanding of the suicide prevention education information provided.  The family member/significant other agrees to remove the items of safety concern listed above.  Wynn Banker 06/17/2013, 11:39 AM
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
## 2013-06-18 NOTE — Progress Notes (Signed)
Adult Psychoeducational Group Note  Date:  06/18/2013 Time:  10:53 AM  Group Topic/Focus:  Personal Choices and Values:   The focus of this group is to help patients assess and explore the importance of values in their lives, how their values affect their decisions, how they express their values and what opposes their expression.  Participation Level:  Active  Participation Quality:  Appropriate  Affect:  Appropriate  Cognitive:  Alert and Appropriate  Insight: Appropriate  Engagement in Group:  Engaged  Modes of Intervention:  Discussion  Additional Comments:  Today's group was a therapeutic activity. Pt attended group and was very engaged.    [place] Shi K 06/18/2013, 10:53 AM
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
## 2013-06-18 NOTE — [facility] Group Notes (Signed)
[facility] LCSW Aftercare Discharge Planning Group Note   06/18/2013 9:43 AM    Participation Quality:  Appropraite  Mood/Affect:  Appropriate  Depression Rating:  0  Anxiety Rating:  0  Thoughts of Suicide:  No  Will you contract for safety?   NA  Current AVH:  No  Plan for Discharge/Comments:  Patient attending discharge planning group and actively participated in group.  Patient reports being much better today and hopes to discharge today.  He will follow up with [facility] for medication management and [facility] for counseling.  CSW provided all participants with daily workbook and information on services offered by [facility].   Transportation Means: Patient has transportation.   Supports:  Patient has a support system.   Verginia Toohey, [place] July

## 2013-06-18 NOTE — Progress Notes (Signed)
Adult Psychoeducational Group Note  Date:  06/18/2013 Time:  9:46 PM  Group Topic/Focus:  Wrap-Up Group:   The focus of this group is to help patients review their daily goal of treatment and discuss progress on daily workbooks.  Participation Level:  Active  Participation Quality:  Appropriate  Affect:  Appropriate  Cognitive:  Alert  Insight: Appropriate  Engagement in Group:  Engaged  Modes of Intervention:  Discussion  Additional Comments:  Pt stated that this was the first day that he enjoyed all the groups. He feels that he is progressively getting better.   Flonnie Hailstone 06/18/2013, 9:46 PM

## 2013-06-18 NOTE — Progress Notes (Signed)
Recreation Therapy Notes  Date: 08.27.2014 Time: 3:00pm Location: [address] Dayroom  Group Topic: Leisure Education  Goal Area(s) Addresses:  Patient will verbalize activity of interest by end of group session. Patient will verbalize the ability to use positive leisure/recreation as a coping mechanism.  Behavioral Response: Appropriate, Engaged  Intervention: Air traffic controller  Activity: Leisure Time Management. Patients were given an worksheet outlining approximately 16 hours of the day, using various colors patients were asked to identify how they use their time. For example red = time at work, blue = time for self-care.   Education:  Discharge Planning.   Education Outcome: Acknowledges understanding  Clinical Observations/Feedback: Patient arrived to group session late, upon arriving patient immediately engaged in activity. Approximately 5 minutes after arriving to session PA asked patient to leave session. Patient returned as group session was wrapping up. Patient finished working on worksheet, but was not able to process with LRT or add to group discussion.    Marykay Lex Nadia Viar, LRT/CTRS  Jearl Klinefelter 06/18/2013 4:13 PM

## 2013-06-18 NOTE — Progress Notes (Signed)
Patient ID: Austin Wells, male   DOB: [birth year], 67 y.o.   MRN: [id number] PER STATE REGULATIONS 482.30  THIS CHART WAS REVIEWED FOR MEDICAL NECESSITY WITH RESPECT TO THE PATIENT'S ADMISSION/ DURATION OF STAY.  NEXT REVIEW DATE: 06/22/2013  Willa Rough, RN, BSN CASE MANAGER

## 2013-06-18 NOTE — Progress Notes (Signed)
Adult Psychoeducational Group Note  Date:  06/18/2013 Time:  11:00AM Group Topic/Focus:  Personal Development  Participation Level:  Active  Participation Quality:  Appropriate and Attentive  Affect:  Appropriate  Cognitive:  Alert and Appropriate  Insight: Appropriate  Engagement in Group:  Engaged  Modes of Intervention:  Discussion  Additional Comments:   Pt. Was attentive and appropriate during today's group discussion. Pt. Was able to write down values. Pt was able to decide if the way you are living now in live with those values. Pt. Was able to break down values in groups values, what do i want to happen and wham am i going to do about it.  Bing Plume D 06/18/2013, 11:52 AM

## 2013-06-19 MED ORDER — PRAVASTATIN SODIUM 40 MG PO TABS: 40.0000 mg | ORAL_TABLET | Freq: Every evening | ORAL | Status: DC

## 2013-06-19 MED ORDER — ADULT MULTIVITAMIN W/MINERALS CH: 1.0000 | ORAL_TABLET | Freq: Every day | ORAL | Status: DC

## 2013-06-19 MED ORDER — HYDROCHLOROTHIAZIDE 12.5 MG PO CAPS: 12.5000 mg | ORAL_CAPSULE | Freq: Every day | ORAL | Status: DC

## 2013-06-19 MED ORDER — TRAMADOL HCL 50 MG PO TABS: 50.0000 mg | ORAL_TABLET | Freq: Four times a day (QID) | ORAL | Status: DC | PRN

## 2013-06-19 MED ORDER — DIVALPROEX SODIUM ER 500 MG PO TB24: 1000.0000 mg | ORAL_TABLET | Freq: Two times a day (BID) | ORAL | Status: DC

## 2013-06-19 MED ORDER — TRAZODONE HCL 50 MG PO TABS: 50.0000 mg | ORAL_TABLET | Freq: Every evening | ORAL | Status: DC | PRN

## 2013-06-19 MED ORDER — DIVALPROEX SODIUM ER 500 MG PO TB24
1000.0000 mg | ORAL_TABLET | Freq: Two times a day (BID) | ORAL | Status: DC
  Filled 2013-06-19: qty 2
  Filled 2013-06-19: qty 12
  Filled 2013-06-19: qty 2
  Filled 2013-06-19: qty 12

## 2013-06-19 MED ORDER — LISINOPRIL 40 MG PO TABS: 40.0000 mg | ORAL_TABLET | Freq: Every day | ORAL | Status: AC

## 2013-06-19 MED ORDER — OMEGA-3 FATTY ACIDS 1000 MG PO CAPS: 2.0000 g | ORAL_CAPSULE | Freq: Every day | ORAL | Status: DC

## 2013-06-19 NOTE — Progress Notes (Signed)
The focus of this group is to educate the patient on the purpose and policies of crisis stabilization and provide a format to answer questions about their admission.  The group details unit policies and expectations of patients while admitted.  Patient attended 0900 nurse education orientation group.  Patient actively participated, appropriate affect, alert, appropriate insight and engagement.  Patient will work on goals for discharge today.  

## 2013-06-19 NOTE — Progress Notes (Signed)
Patient ID: Austin Wells, male   DOB: 02-07-1946, 67 y.o.   MRN: [id number] D: Took over patient's care @ 2330. Patient in bed sleeping. Respiration regular and unlabored. No sign of distress noted at this time A: 15 mins checks for safety. R: Patient  is safe.

## 2013-06-19 NOTE — Progress Notes (Signed)
Adult Psychoeducational Group Note  Date:  06/19/2013 Time:  11:00AM Group Topic/Focus:  Leisure and Lifestyle Changes  Participation Level:  Active  Participation Quality:  Appropriate and Attentive  Affect:  Appropriate  Cognitive:  Alert and Appropriate  Insight: Appropriate  Engagement in Group:  Engaged  Modes of Intervention:  Discussion  Additional Comments:  Pt. Was attentive and appropriate during today's group discussion. Pt. Was able to complete self esteem worksheet on positive thing about yourself for each letter A-Z. Pt was able to come up with the following words in a group setting Cheerful and dependable.   Bing Plume D 06/19/2013, 11:58 AM

## 2013-06-19 NOTE — Progress Notes (Signed)
D/C instructions/meds/follow-up appointments reviewed, pt verbalized understanding, pt's belongings returned to pt, samples given. 
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
## 2013-06-19 NOTE — [facility] Suicide Risk Assessment (Signed)
Suicide Risk Assessment  Discharge Assessment     Demographic Factors:  Male, Adolescent or young adult, Caucasian and Low socioeconomic status  Mental Status Per Nursing Assessment::   On Admission:  Suicidal ideation indicated by patient  Current Mental Status by Physician: Mental Status Examination: Patient appeared as per his stated age, casually dressed, and fairly groomed, and maintaining good eye contact. Patient has good mood and his affect was constricted. He has normal rate, rhythm, and volume of speech. His thought process is linear and goal directed. Patient has denied suicidal, homicidal ideations, intentions or plans. Patient has no evidence of auditory or visual hallucinations, delusions, and paranoia. Patient has fair insight judgment and impulse control.  Loss Factors: Financial problems/change in socioeconomic status  Historical Factors: Impulsivity  Risk Reduction Factors:   Sense of responsibility to family, Religious beliefs about death, Living with another person, especially a relative, Positive social support, Positive therapeutic relationship and Positive coping skills or problem solving skills  Continued Clinical Symptoms:  Bipolar Disorder:   Mixed State  Cognitive Features That Contribute To Risk:  Polarized thinking    Suicide Risk:  Minimal: No identifiable suicidal ideation.  Patients presenting with no risk factors but with morbid ruminations; may be classified as minimal risk based on the severity of the depressive symptoms  Discharge Diagnoses:   AXIS I:  Bipolar, mixed AXIS II:  Deferred AXIS III:   Past Medical History  Diagnosis Date  . Hyperlipidemia   . Depression   . Hypertension    AXIS IV:  other psychosocial or environmental problems and problems related to social environment AXIS V:  51-60 moderate symptoms  Plan Of Care/Follow-up recommendations:  Activity:  As tolerated Diet:  Regular  Is patient on multiple antipsychotic  therapies at discharge:  No   Has Patient had three or more failed trials of antipsychotic monotherapy by history:  No  Recommended Plan for Multiple Antipsychotic Therapies: NA  Nehemiah Settle., M.D. 06/19/2013, 1:09 PM

## 2013-06-19 NOTE — Progress Notes (Signed)
[facility] Adult Case Management Discharge Plan :  Will you be returning to the same living situation after discharge: No. Patient will live with a friend. At discharge, do you have transportation home?:Yes,  Patient will arrange transportation home Do you have the ability to pay for your medications:Yes,  patient is able to obtain medications.  Release of information consent forms completed and in the chart;  Patient's signature needed at discharge.  Patient to Follow up at: Follow-up Information   Follow up with [facility] On 07/05/2013. (Saturday July 05, 2013 at [facility])    Contact information:   [address] [place], [place]  [address]  [phone]      Follow up with [facility] On 06/24/2013. (Please to to [facility]'s walk in clinic on Tuesday, June 24, 2013 between 8AM - 2PM)    Contact information:   [address] [place], [place]   [address]   [phone]      Patient denies SI/HI:   Patient no longer endorsing SI/HI or other thoughts of self harm.  Safety Planning and Suicide Prevention discussed: .Reviewed with all patients during discharge planning group  Austin Wells, Joesph July 06/19/2013, 10:15 AM

## 2013-06-19 NOTE — Progress Notes (Signed)
[facility] Group Notes:  (Nursing/MHT/Case Management/Adjunct)  Date:  06/19/2013  Time:  10:38 AM  Type of Therapy:  Therapeutic Activity  Participation Level:  Active  Participation Quality:  Appropriate and Attentive  Affect:  Appropriate  Cognitive:  Appropriate  Insight:  Appropriate  Engagement in Group:  Engaged  Modes of Intervention:  Activity and Socialization  Summary of Progress/Problems: Pt attended group and actively participated.  Anasofia Micallef C 06/19/2013, 10:38 AM 

## 2013-06-19 NOTE — [facility] Group Notes (Signed)
[facility] LCSW Group Therapy  [employer] [facility] [place] 1:15 - 2:30 PM  06/19/2013   Type of Therapy:  Group Therapy  Participation Level:  Minimal  Participation Quality:  Attentive  Affect:  Appropriate  Cognitive:  Appropriate  Insight:  Developing/Improving and Engaged  Engagement in Therapy:  Developing/Improving Engaged  Modes of Intervention:  Discussion, Education, Exploration, Problem-Solving, Rapport Building, Support   Summary of Progress/Problems:  Patient listened attentively to speaker from [employer].   Austin Wells 06/19/2013

## 2013-06-24 NOTE — Progress Notes (Signed)
Patient Discharge Instructions:  After Visit Summary (AVS):   Faxed to:  06/24/13 Discharge Summary Note:   Faxed to:  06/24/13 Psychiatric Admission Assessment Note:   Faxed to:  06/24/13 Suicide Risk Assessment - Discharge Assessment:   Faxed to:  06/24/13 Faxed/Sent to the Next Level Care provider:  06/24/13 Faxed to [facility] @ [phone] Faxed to [facility] @ [phone]  Jerelene Redden, 06/24/2013, 3:52 PM

## 2017-02-03 ENCOUNTER — Emergency Department (HOSPITAL_COMMUNITY)
Admission: EM | Admit: 2017-02-03 | Discharge: 2017-02-03 | Disposition: A | Discharge status: Home / Self Care | Payer: Medicare Other | Attending: Emergency Medicine | Admitting: Emergency Medicine

## 2017-02-03 ENCOUNTER — Emergency Department (HOSPITAL_COMMUNITY): Payer: Medicare Other

## 2017-02-03 DIAGNOSIS — R55 Syncope and collapse: Secondary | ICD-10-CM | POA: Insufficient documentation

## 2017-02-03 DIAGNOSIS — I10 Essential (primary) hypertension: Secondary | ICD-10-CM | POA: Insufficient documentation

## 2017-02-03 DIAGNOSIS — Z79899 Other long term (current) drug therapy: Secondary | ICD-10-CM | POA: Insufficient documentation

## 2017-02-03 DIAGNOSIS — F17200 Nicotine dependence, unspecified, uncomplicated: Secondary | ICD-10-CM | POA: Insufficient documentation

## 2017-02-03 DIAGNOSIS — R42 Dizziness and giddiness: Secondary | ICD-10-CM | POA: Diagnosis not present

## 2017-02-03 LAB — CBC
HCT: 38.2 % — ABNORMAL LOW (ref 39.0–52.0)
Hemoglobin: 13.9 g/dL (ref 13.0–17.0)
MCH: 33 pg (ref 26.0–34.0)
MCHC: 36.4 g/dL — AB (ref 30.0–36.0)
MCV: 90.7 fL (ref 78.0–100.0)
Platelets: 118 10*3/uL — ABNORMAL LOW (ref 150–400)
RBC: 4.21 MIL/uL — AB (ref 4.22–5.81)
RDW: 12.1 % (ref 11.5–15.5)
WBC: 6.7 10*3/uL (ref 4.0–10.5)

## 2017-02-03 LAB — COMPREHENSIVE METABOLIC PANEL
ALK PHOS: 38 U/L (ref 38–126)
ALT: 26 U/L (ref 17–63)
AST: 26 U/L (ref 15–41)
Albumin: 3.9 g/dL (ref 3.5–5.0)
Anion gap: 9 (ref 5–15)
BUN: 14 mg/dL (ref 6–20)
CALCIUM: 9.4 mg/dL (ref 8.9–10.3)
CO2: 24 mmol/L (ref 22–32)
CREATININE: 1.24 mg/dL (ref 0.61–1.24)
Chloride: 105 mmol/L (ref 101–111)
GFR calc non Af Amer: 58 mL/min — ABNORMAL LOW (ref >60)
GLUCOSE: 118 mg/dL — AB (ref 65–99)
Potassium: 3.6 mmol/L (ref 3.5–5.1)
SODIUM: 138 mmol/L (ref 135–145)
Total Bilirubin: 1.7 mg/dL — ABNORMAL HIGH (ref 0.3–1.2)
Total Protein: 6.3 g/dL — ABNORMAL LOW (ref 6.5–8.1)

## 2017-02-03 LAB — I-STAT TROPONIN, ED
TROPONIN I, POC: 0 ng/mL (ref 0.00–0.08)
Troponin i, poc: 0 ng/mL (ref 0.00–0.08)

## 2017-02-03 LAB — CBG MONITORING, ED: Glucose-Capillary: 116 mg/dL — ABNORMAL HIGH (ref 65–99)

## 2017-02-03 LAB — URINALYSIS, ROUTINE W REFLEX MICROSCOPIC
BILIRUBIN URINE: NEGATIVE
GLUCOSE, UA: NEGATIVE mg/dL
HGB URINE DIPSTICK: NEGATIVE
Ketones, ur: NEGATIVE mg/dL
Leukocytes, UA: NEGATIVE
Nitrite: NEGATIVE
Protein, ur: NEGATIVE mg/dL
Specific Gravity, Urine: 1.014 (ref 1.005–1.030)
pH: 6 (ref 5.0–8.0)

## 2017-02-03 LAB — SAMPLE TO BLOOD BANK

## 2017-02-03 MED ORDER — SODIUM CHLORIDE 0.9 % IV BOLUS (SEPSIS)
500.0000 mL | Freq: Once | INTRAVENOUS | Status: AC
  Administered 2017-02-03: 500 mL via INTRAVENOUS

## 2017-02-03 MED ORDER — SODIUM CHLORIDE 0.9 % IV BOLUS (SEPSIS)
1000.0000 mL | Freq: Once | INTRAVENOUS | Status: AC
  Administered 2017-02-03: 1000 mL via INTRAVENOUS

## 2017-02-03 NOTE — ED Triage Notes (Signed)
Pt in from home via [facility], per report pt was sitting at home & had witnessed 2 min syncopal episode, did not hit head, pt hypotensive & diaphoretic on scener per EMS, pt rcvd 800 mL NS pta, pt SB at HR 53, pt A&O x4, 91 % O2 sat on RA, denies CP & SOB, N/V/D, systolic BP 89

## 2017-02-03 NOTE — ED Provider Notes (Addendum)
[place] DEPT Provider Note   CSN: [id number] Arrival date & time: 02/03/17  1654     History   Chief Complaint Chief Complaint  Patient presents with  . Chest Pain    HPI Austin Wells is a 71 y.o. male.  Patient w hx bipolar disorder, c/o feeling weak, faint, lightheaded today. ?Brief syncopal event at home. No trauma/injury. Symptoms onset today. Acute. Persistent. Patient denies any chest pain or discomfort. No sob or unusual doe. No headache. Denies any pain. No abd pain. No nvd. No dysuria or gu c/o. No extremity pain or swelling. No rash/lesions. Denies fever or chills. No blood loss, rectal bleeding or melena. No recent change in meds or new meds. Denies over use or overdose of meds. +THC use today. States hasnt had much or anything to eat or drink today. Denies depression or thoughts of self harm.    The history is provided by the patient.  Chest Pain   Pertinent negatives include no abdominal pain, no back pain, no fever, no headaches, no numbness, no palpitations, no shortness of breath, no vomiting and no weakness.    Past Medical History:  Diagnosis Date  . Depression   . Hyperlipidemia   . Hypertension     Patient Active Problem List   Diagnosis Date Noted  . Bipolar I disorder, most recent episode (or current) mixed, unspecified 06/16/2013  . Suicide attempt ([place]) 06/15/2013  . Suicidal ideation 06/15/2013  . MDD (major depressive disorder) 06/15/2013    No past surgical history on file.     Home Medications    Prior to Admission medications   Medication Sig Start Date End Date Taking? Authorizing Provider  divalproex (DEPAKOTE ER) 500 MG 24 hr tablet Take 2 tablets (1,000 mg total) by mouth 2 (two) times daily. For mood stabilization. 06/19/13   Ruben Im, PA-C  fish oil-omega-3 fatty acids 1000 MG capsule Take 2 capsules (2 g total) by mouth daily. Cholesterol and lipid control. 06/19/13   Ruben Im, PA-C  hydrochlorothiazide  (MICROZIDE) 12.5 MG capsule Take 1 capsule (12.5 mg total) by mouth daily. 06/19/13   Ruben Im, PA-C  lisinopril (PRINIVIL,ZESTRIL) 40 MG tablet Take 1 tablet (40 mg total) by mouth daily. For hypertension. 06/19/13   Ruben Im, PA-C  Multiple Vitamin (MULTIVITAMIN WITH MINERALS) TABS tablet Take 1 tablet by mouth daily. For nutritional supplement 06/19/13   Ruben Im, PA-C  pravastatin (PRAVACHOL) 40 MG tablet Take 1 tablet (40 mg total) by mouth every evening. For cholesterol and lipid control. 06/19/13   Ruben Im, PA-C  salicylic acid-lactic acid 17 % external solution Apply 1 application topically daily as needed (for wart). Applied to wart    Historical Provider, MD  sodium chloride (OCEAN) 0.65 % nasal spray Place 2 sprays into the nose 3 (three) times daily as needed for congestion.    Historical Provider, MD  traMADol (ULTRAM) 50 MG tablet Take 1 tablet (50 mg total) by mouth every 6 (six) hours as needed for pain. 06/19/13   Ruben Im, PA-C  traZODone (DESYREL) 50 MG tablet Take 1 tablet (50 mg total) by mouth at bedtime as needed and may repeat dose one time if needed for sleep. 06/19/13   Ruben Im, PA-C    Family History No family history on file.  Social History Social History  Substance Use Topics  . Smoking status: Current Every Day Smoker  . Smokeless tobacco: Not on file  .  Alcohol use Yes     Allergies   Patient has no known allergies.   Review of Systems Review of Systems  Constitutional: Negative for chills and fever.  HENT: Negative for sore throat.   Eyes: Negative for visual disturbance.  Respiratory: Negative for shortness of breath.   Cardiovascular: Positive for chest pain. Negative for palpitations and leg swelling.  Gastrointestinal: Negative for abdominal pain, blood in stool, diarrhea and vomiting.  Endocrine: Negative for polyuria.  Genitourinary: Negative for dysuria and flank pain.  Musculoskeletal: Negative for back  pain and neck pain.  Skin: Negative for rash.  Neurological: Negative for weakness, numbness and headaches.  Hematological: Does not bruise/bleed easily.  Psychiatric/Behavioral: Negative for confusion, dysphoric mood and suicidal ideas.     Physical Exam Updated Vital Signs There were no vitals taken for this visit.  Physical Exam  Constitutional: He appears well-developed and well-nourished. No distress.  HENT:  Mouth/Throat: Oropharynx is clear and moist.  Eyes: Conjunctivae are normal.  Neck: Neck supple. No tracheal deviation present.  No stiffness or rigidity  Cardiovascular: Normal rate, regular rhythm, normal heart sounds and intact distal pulses.  Exam reveals no gallop and no friction rub.   No murmur heard. Pulmonary/Chest: Effort normal and breath sounds normal. No accessory muscle usage. No respiratory distress.  Abdominal: Soft. Bowel sounds are normal. He exhibits no distension and no mass. There is no tenderness. There is no rebound and no guarding. No hernia.  No puls mass  Genitourinary:  Genitourinary Comments: No cva tenderness  Musculoskeletal: He exhibits no edema.  Neurological: He is alert.  Speech clear/fluent. Motor intact bil. sens grossly intact.   Skin: Skin is warm and dry. He is not diaphoretic.  Psychiatric: He has a normal mood and affect.  Nursing note and vitals reviewed.    ED Treatments / Results  Labs (all labs ordered are listed, but only abnormal results are displayed) Results for orders placed or performed during the hospital encounter of 02/03/17  CBC  Result Value Ref Range   WBC 6.7 4.0 - 10.5 K/uL   RBC 4.21 (L) 4.22 - 5.81 MIL/uL   Hemoglobin 13.9 13.0 - 17.0 g/dL   HCT 38.2 (L) 39.0 - 52.0 %   MCV 90.7 78.0 - 100.0 fL   MCH 33.0 26.0 - 34.0 pg   MCHC 36.4 (H) 30.0 - 36.0 g/dL   RDW 12.1 11.5 - 15.5 %   Platelets 118 (L) 150 - 400 K/uL  Urinalysis, Routine w reflex microscopic  Result Value Ref Range   Color, Urine  YELLOW YELLOW   APPearance HAZY (A) CLEAR   Specific Gravity, Urine 1.014 1.005 - 1.030   pH 6.0 5.0 - 8.0   Glucose, UA NEGATIVE NEGATIVE mg/dL   Hgb urine dipstick NEGATIVE NEGATIVE   Bilirubin Urine NEGATIVE NEGATIVE   Ketones, ur NEGATIVE NEGATIVE mg/dL   Protein, ur NEGATIVE NEGATIVE mg/dL   Nitrite NEGATIVE NEGATIVE   Leukocytes, UA NEGATIVE NEGATIVE  Comprehensive metabolic panel  Result Value Ref Range   Sodium 138 135 - 145 mmol/L   Potassium 3.6 3.5 - 5.1 mmol/L   Chloride 105 101 - 111 mmol/L   CO2 24 22 - 32 mmol/L   Glucose, Bld 118 (H) 65 - 99 mg/dL   BUN 14 6 - 20 mg/dL   Creatinine, Ser 1.24 0.61 - 1.24 mg/dL   Calcium 9.4 8.9 - 10.3 mg/dL   Total Protein 6.3 (L) 6.5 - 8.1 g/dL   Albumin 3.9  3.5 - 5.0 g/dL   AST 26 15 - 41 U/L   ALT 26 17 - 63 U/L   Alkaline Phosphatase 38 38 - 126 U/L   Total Bilirubin 1.7 (H) 0.3 - 1.2 mg/dL   GFR calc non Af Amer 58 (L) >60 mL/min   GFR calc Af Amer >60 >60 mL/min   Anion gap 9 5 - 15  CBG monitoring, ED  Result Value Ref Range   Glucose-Capillary 116 (H) 65 - 99 mg/dL  I-stat troponin, ED  Result Value Ref Range   Troponin i, poc 0.00 0.00 - 0.08 ng/mL   Comment 3          I-stat troponin, ED  Result Value Ref Range   Troponin i, poc 0.00 0.00 - 0.08 ng/mL   Comment 3          Sample to Blood Bank  Result Value Ref Range   Blood Bank Specimen SAMPLE AVAILABLE FOR TESTING    Sample Expiration 02/04/2017    Dg Chest Port 1 View  Result Date: 02/03/2017 CLINICAL DATA:  Weakness EXAM: PORTABLE CHEST 1 VIEW COMPARISON:  None. FINDINGS: Cardiomegaly is noted. No infiltrate or pulmonary edema. Mild left basilar atelectasis. IMPRESSION: Cardiomegaly. No infiltrate or pulmonary edema. Mild atelectasis left base. Electronically Signed   By: Lahoma Crocker M.D.   On: 02/03/2017 17:54    EKG  EKG Interpretation None       Radiology No results found.  Procedures Procedures (including critical care time)  Medications  Ordered in ED Medications - No data to display   Initial Impression / Assessment and Plan / ED Course  I have reviewed the triage vital signs and the nursing notes.  Pertinent labs & imaging results that were available during my care of the patient were reviewed by me and considered in my medical decision making (see chart for details).  Iv ns. Continuous pulse ox and monitor.   ecg. Labs.   Reviewed nursing notes and prior charts for additional history.   Recheck pt - pt states feels improved. No faintness or dizziness. Pt given po fluids and food.  Iv ns bolus.   Pt ambulates in ED with steady gait.  Pt continues to deny any pain. No headache. No cp. No sob. No abd pain. No nv.  Pt denies fever or chills, afebrile in ED.    Pt remains asymptomatic on additional recheck and states feels ready for d/c.     Final Clinical Impressions(s) / ED Diagnoses   Final diagnoses:  None    New Prescriptions New Prescriptions   No medications on file       Lajean Saver, MD 02/03/17 2050

## 2017-02-03 NOTE — ED Notes (Signed)
Pt and family understood dc material. NAD Noted 

## 2017-02-03 NOTE — Discharge Instructions (Signed)
It was our pleasure to provide your ER care today - we hope that you feel better.  Rest. Drink plenty of fluids.  Follow up with primary care doctor Monday for recheck.   Return to ER right away if worse, new symptoms, fevers, weak/faint, chest pain, trouble breathing, other concern.

## 2017-02-03 NOTE — ED Notes (Signed)
Pt states unable to provide urine sample at this time. Urinal and call light bedside.

## 2017-02-03 NOTE — ED Notes (Addendum)
Pt ambulated in hall without symptoms and no assistance.

## 2019-07-15 ENCOUNTER — Other Ambulatory Visit: Payer: Self-pay

## 2019-07-15 ENCOUNTER — Observation Stay (HOSPITAL_COMMUNITY)
Admission: EM | Admit: 2019-07-15 | Discharge: 2019-07-16 | Disposition: A | Discharge status: Home / Self Care | Payer: No Typology Code available for payment source | Attending: Internal Medicine | Admitting: Internal Medicine

## 2019-07-15 DIAGNOSIS — I952 Hypotension due to drugs: Secondary | ICD-10-CM | POA: Diagnosis not present

## 2019-07-15 DIAGNOSIS — I10 Essential (primary) hypertension: Secondary | ICD-10-CM | POA: Diagnosis present

## 2019-07-15 DIAGNOSIS — Z20828 Contact with and (suspected) exposure to other viral communicable diseases: Secondary | ICD-10-CM | POA: Diagnosis not present

## 2019-07-15 DIAGNOSIS — F319 Bipolar disorder, unspecified: Secondary | ICD-10-CM | POA: Diagnosis not present

## 2019-07-15 DIAGNOSIS — N183 Chronic kidney disease, stage 3 unspecified: Secondary | ICD-10-CM | POA: Diagnosis present

## 2019-07-15 DIAGNOSIS — H9190 Unspecified hearing loss, unspecified ear: Secondary | ICD-10-CM

## 2019-07-15 DIAGNOSIS — Z79899 Other long term (current) drug therapy: Secondary | ICD-10-CM | POA: Insufficient documentation

## 2019-07-15 DIAGNOSIS — E785 Hyperlipidemia, unspecified: Secondary | ICD-10-CM | POA: Diagnosis not present

## 2019-07-15 DIAGNOSIS — Z720 Tobacco use: Secondary | ICD-10-CM | POA: Diagnosis present

## 2019-07-15 DIAGNOSIS — I129 Hypertensive chronic kidney disease with stage 1 through stage 4 chronic kidney disease, or unspecified chronic kidney disease: Secondary | ICD-10-CM | POA: Diagnosis not present

## 2019-07-15 DIAGNOSIS — R55 Syncope and collapse: Secondary | ICD-10-CM | POA: Diagnosis not present

## 2019-07-15 DIAGNOSIS — I951 Orthostatic hypotension: Secondary | ICD-10-CM | POA: Diagnosis present

## 2019-07-15 HISTORY — DX: Orthostatic hypotension: I95.1

## 2019-07-15 HISTORY — DX: Syncope and collapse: R55

## 2019-07-15 HISTORY — DX: Tobacco use: Z72.0

## 2019-07-15 LAB — COMPREHENSIVE METABOLIC PANEL
ALT: 19 U/L (ref 0–44)
AST: 21 U/L (ref 15–41)
Albumin: 3.8 g/dL (ref 3.5–5.0)
Alkaline Phosphatase: 37 U/L — ABNORMAL LOW (ref 38–126)
Anion gap: 9 (ref 5–15)
BUN: 14 mg/dL (ref 8–23)
CO2: 24 mmol/L (ref 22–32)
Calcium: 9 mg/dL (ref 8.9–10.3)
Chloride: 103 mmol/L (ref 98–111)
Creatinine, Ser: 1.29 mg/dL — ABNORMAL HIGH (ref 0.61–1.24)
GFR calc Af Amer: >60 mL/min (ref >60)
GFR calc non Af Amer: 55 mL/min — ABNORMAL LOW (ref >60)
Glucose, Bld: 114 mg/dL — ABNORMAL HIGH (ref 70–99)
Potassium: 3.4 mmol/L — ABNORMAL LOW (ref 3.5–5.1)
Sodium: 136 mmol/L (ref 135–145)
Total Bilirubin: 0.9 mg/dL (ref 0.3–1.2)
Total Protein: 6.5 g/dL (ref 6.5–8.1)

## 2019-07-15 LAB — ETHANOL: Alcohol, Ethyl (B): <10 mg/dL (ref <10)

## 2019-07-15 LAB — CBG MONITORING, ED: Glucose-Capillary: 102 mg/dL — ABNORMAL HIGH (ref 70–99)

## 2019-07-15 MED ORDER — SODIUM CHLORIDE 0.9 % IV SOLN: INTRAVENOUS | Status: DC

## 2019-07-15 MED ORDER — SODIUM CHLORIDE 0.9 % IV BOLUS
1000.0000 mL | Freq: Once | INTRAVENOUS | Status: AC
  Administered 2019-07-15: 1000 mL via INTRAVENOUS

## 2019-07-15 NOTE — ED Provider Notes (Signed)
[facility] EMERGENCY DEPARTMENT Provider Note   CSN: [id number] Arrival date & time: 07/15/19  2218     History   Chief Complaint No chief complaint on file.   HPI Austin Wells is a 73 y.o. male with history of insomnia, hypertension, depression brought to the ER by EMS for evaluation of syncope.  Patient is oriented to self, place, time.  He states he took Viagra at 7 PM and then had sex.  He stood up to get peanut butter sandwich and milk and had sudden onset lightheadedness.  He thinks he passed out.  He reports hitting his head on a nearby trash can.  He does not know how long he was out for.  He remembers being in the ambulance.  Currently he feels better.  He denies lightheadedness with movements.  Denies any prodromal vision changes, chest pain, shortness of breath, palpitations, diaphoresis, nausea or vomiting.  States he had diarrhea while he was on the ground.  He denies any headache, neck pain, back pain or other physical injury after the fall.  He denies any known personal cardiac history.  He takes lisinopril and hydrochlorothiazide for blood pressure.  He also took trazodone before the syncopal episode.  States he only drinks water when he takes his medicines and usually does not drink much throughout the day.  Has been feeling well otherwise without any recent illness.  No recent vomiting or diarrhea.  He has never had any syncopal episodes before.  Denies any illicit drug use or alcohol use today.  Episode was witnessed by wife.  Does not take any nitrates.      HPI  Past Medical History:  Diagnosis Date  . Depression   . Hyperlipidemia   . Hypertension     Patient Active Problem List   Diagnosis Date Noted  . Syncope 07/16/2019  . Orthostatic hypotension 07/16/2019  . Hypertension   . CKD (chronic kidney disease), stage III ([place])   . Bipolar I disorder ([place]) 06/16/2013  . Suicide attempt ([place]) 06/15/2013  . Suicidal ideation 06/15/2013  . MDD  (major depressive disorder) 06/15/2013    No past surgical history on file.      Home Medications    Prior to Admission medications   Medication Sig Start Date End Date Taking? Authorizing Provider  ARIPiprazole (ABILIFY) 5 MG tablet Take 5 mg by mouth daily. For mood stabilization and depression    [provider]  calcium-vitamin D (OSCAL WITH D) 500-200 MG-UNIT tablet Take 1 tablet by mouth daily.    [provider]  Cholecalciferol (VITAMIN D) 2000 units tablet Take 2,000 Units by mouth daily.    [provider]  cyanocobalamin 500 MCG tablet Take 500 mcg by mouth daily. Vitamin B12    [provider]  divalproex (DEPAKOTE ER) 500 MG 24 hr tablet Take 2 tablets (1,000 mg total) by mouth 2 (two) times daily. For mood stabilization. Patient not taking: Reported on 02/03/2017 06/19/13   Nena Polio T, PA-C  fish oil-omega-3 fatty acids 1000 MG capsule Take 2 capsules (2 g total) by mouth daily. Cholesterol and lipid control. Patient not taking: Reported on 02/03/2017 06/19/13   Nena Polio T, PA-C  hydrochlorothiazide (HYDRODIURIL) 25 MG tablet Take 12.5 mg by mouth daily. For blood pressure    [provider]  hydrochlorothiazide (MICROZIDE) 12.5 MG capsule Take 1 capsule (12.5 mg total) by mouth daily. Patient not taking: Reported on 02/03/2017 06/19/13   Ruben Im, PA-C  lisinopril (PRINIVIL,ZESTRIL) 40 MG tablet Take 1 tablet (40 mg total) by mouth daily. For hypertension. Patient taking differently: Take 40 mg by mouth daily. For blood pressure 06/19/13   Ruben Im, PA-C  Magnesium Oxide 420 MG TABS Take 420 mg by mouth daily. For muscle cramps    [provider]  mirtazapine (REMERON) 15 MG tablet Take 15 mg by mouth at bedtime. For tremors    [provider]  Multiple Vitamin (MULTIVITAMIN WITH MINERALS) TABS tablet Take 1 tablet by mouth daily. For nutritional supplement 06/19/13   Nena Polio T, PA-C   pravastatin (PRAVACHOL) 40 MG tablet Take 1 tablet (40 mg total) by mouth every evening. For cholesterol and lipid control. Patient taking differently: Take 40 mg by mouth at bedtime. For cholesterol and lipid control. 06/19/13   Ruben Im, PA-C  propranolol (INDERAL) 80 MG tablet Take 80 mg by mouth daily. For tremors    [provider]  sildenafil (VIAGRA) 100 MG tablet Take 50 mg by mouth daily as needed for erectile dysfunction.    [provider]  traMADol (ULTRAM) 50 MG tablet Take 1 tablet (50 mg total) by mouth every 6 (six) hours as needed for pain. Patient not taking: Reported on 02/03/2017 06/19/13   Nena Polio T, PA-C  traZODone (DESYREL) 50 MG tablet Take 1 tablet (50 mg total) by mouth at bedtime as needed and may repeat dose one time if needed for sleep. Patient taking differently: Take 50 mg by mouth at bedtime as needed for sleep.  06/19/13   Ruben Im, PA-C    Family History No family history on file.  Social History Social History   Tobacco Use  . Smoking status: Current Every Day Smoker  Substance Use Topics  . Alcohol use: Yes  . Drug use: Not on file     Allergies   Patient has no known allergies.   Review of Systems Review of Systems  Neurological: Positive for syncope and light-headedness.  All other systems reviewed and are negative.    Physical Exam Updated Vital Signs BP 113/79   Pulse (!) 52   Temp 97.6 F (36.4 C) (Axillary)   Resp (!) 22   Ht 5\' 11"  (1.803 m)   Wt 90.7 kg   SpO2 96%   BMI 27.89 kg/m   Physical Exam Vitals signs and nursing note reviewed.  Constitutional:      General: He is not in acute distress.    Appearance: He is well-developed.     Comments: Awake.  Hard of hearing.  HENT:     Head: Normocephalic and atraumatic.     Comments: No facial or scalp bone tenderness or signs of trauma.    Right Ear: External ear normal.     Left Ear: External ear normal.     Ears:     Comments:  No battle signs.    Nose: Nose normal.     Mouth/Throat:     Comments: Moist mucous membranes.  Edentulous. Eyes:     General: No scleral icterus.    Conjunctiva/sclera: Conjunctivae normal.     Comments: No periorbital ecchymosis.  Neck:     Musculoskeletal: Normal range of motion and neck supple.     Comments: No midline or paraspinal tenderness.  Full range of motion the neck without any pain.  Questionable left carotid bruit. Cardiovascular:     Rate and Rhythm: Normal rate and regular rhythm.     Heart sounds: Murmur  present.     Comments: 1+ radial and DP pulses bilaterally.  No lower extremity edema.  Questionable, very subtle systolic murmur. Pulmonary:     Effort: Pulmonary effort is normal.     Breath sounds: Normal breath sounds.     Comments: No A/P/L chest wall tenderness or ecchymosis Abdominal:     General: Abdomen is flat.     Palpations: Abdomen is soft.     Tenderness: There is no abdominal tenderness.  Musculoskeletal: Normal range of motion.        General: No deformity.     Comments: TL spine: No midline or paraspinal muscle tenderness.  No back ecchymosis. Pelvis: Full range of motion of hips bilaterally without pain.  Patient able to SLR and hold bilaterally without any pain.  Skin:    General: Skin is warm and dry.     Capillary Refill: Capillary refill takes less than 2 seconds.  Neurological:     Mental Status: He is alert and oriented to person, place, and time.     Comments:   Mental Status: Awake, alert, oriented to person, place, year, and situation. Able to give a clear and coherent history.  Speech is fluent and clear without dysarthria or aphasia.   Cranial Nerves: I not tested II visual fields full bilaterally. PERRL.  Unable to visualize posterior eye. III, IV, VI EOMs intact without ptosis or diplopia  V sensation to light touch intact in all 3 divisions of trigeminal nerve bilaterally  VII facial movements symmetric bilaterally VIII  hearing intact to voice/conversation  IX, X no uvula deviation, symmetric rise of soft palate/uvula XI 5/5 SCM and trapezius strength bilaterally  XII tongue protrusion midline, symmetric L/R movements  Motor: Strength 5/5 in upper/lower extremities .   Sensation to light touch intact in face, upper/lower extremities. No pronator drift. No leg drop.  Cerebellar: No ataxia with finger to nose.  No truncal sway.  Psychiatric:        Behavior: Behavior normal.        Thought Content: Thought content normal.        Judgment: Judgment normal.      ED Treatments / Results  Labs (all labs ordered are listed, but only abnormal results are displayed) Labs Reviewed  CBC WITH DIFFERENTIAL/PLATELET - Abnormal; Notable for the following components:      Result Value   WBC 12.1 (*)    MCHC 36.1 (*)    Platelets 109 (*)    Neutro Abs 9.2 (*)    All other components within normal limits  COMPREHENSIVE METABOLIC PANEL - Abnormal; Notable for the following components:   Potassium 3.4 (*)    Glucose, Bld 114 (*)    Creatinine, Ser 1.29 (*)    Alkaline Phosphatase 37 (*)    GFR calc non Af Amer 55 (*)    All other components within normal limits  CBG MONITORING, ED - Abnormal; Notable for the following components:   Glucose-Capillary 102 (*)    All other components within normal limits  SARS CORONAVIRUS 2 (TAT 6-24 HRS)  ETHANOL  BASIC METABOLIC PANEL  CBC    EKG EKG Interpretation  Date/Time:  Tuesday July 15 2019 22:32:23 EDT Ventricular Rate:  62 PR Interval:    QRS Duration: 107 QT Interval:  462 QTC Calculation: 470 R Axis:   -21 Text Interpretation:  Sinus rhythm Prolonged PR interval Borderline left axis deviation RSR' in V1 or V2, right VCD or RVH When compared with ECG  of 02/03/2017, No significant change was found Confirmed by Delora Fuel ([id number]) on 07/16/2019 1:47:14 AM   Radiology Ct Head Wo Contrast  Result Date: 07/16/2019 CLINICAL DATA:  Syncope, no  other cardiac symptoms took viagra had sex, got up passed out, hypotensive in ER. +head trauma, benign exam otherwise EXAM: CT HEAD WITHOUT CONTRAST TECHNIQUE: Contiguous axial images were obtained from the base of the skull through the vertex without intravenous contrast. COMPARISON:  None. FINDINGS: Brain: Brain volume is normal for age. No intracranial hemorrhage, mass effect, or midline shift. No hydrocephalus. The basilar cisterns are patent. No evidence of territorial infarct or acute ischemia. No extra-axial or intracranial fluid collection. Vascular: No hyperdense vessel. Skull: No fracture or focal lesion. Sinuses/Orbits: Mild mucosal thickening of scattered ethmoid air cells. Sclerosis and opacification of lower right mastoid air cells. Included orbits are unremarkable. Other: None. IMPRESSION: No acute intracranial abnormality. Electronically Signed   By: Keith Rake M.D.   On: 07/16/2019 00:40    Procedures Procedures (including critical care time)  Medications Ordered in ED Medications  sodium chloride flush (NS) 0.9 % injection 3 mL (3 mLs Intravenous Given 07/16/19 0326)  enoxaparin (LOVENOX) injection 40 mg (has no administration in time range)  0.9 % NaCl with KCl 20 mEq/ L  infusion (has no administration in time range)  acetaminophen (TYLENOL) tablet 650 mg (has no administration in time range)    Or  acetaminophen (TYLENOL) suppository 650 mg (has no administration in time range)  ondansetron (ZOFRAN) tablet 4 mg (has no administration in time range)    Or  ondansetron (ZOFRAN) injection 4 mg (has no administration in time range)  sodium chloride 0.9 % bolus 1,000 mL (1,000 mLs Intravenous New Bag/Given 07/15/19 2307)  sodium chloride 0.9 % bolus 1,000 mL (1,000 mLs Intravenous New Bag/Given 07/16/19 0331)  potassium chloride SA (K-DUR) CR tablet 20 mEq (20 mEq Oral Given 07/16/19 0330)     Initial Impression / Assessment and Plan / ED Course  I have reviewed the triage  vital signs and the nursing notes.  Pertinent labs & imaging results that were available during my care of the patient were reviewed by me and considered in my medical decision making (see chart for details).  72 year old male here for syncope with prodromal lightheadedness.  He had just taken Viagra at 7pm, had intercourse and stood up.  Had no prodromal chest pain, shortness of breath.  There was head trauma.  Does not take any nitrates.   I reviewed patient's emerge obtain pertinent PMH.  Triage note reviewed.  Family reported patient stopped breathing and started CPR.  Patient was hypotensive with SBP 60s and heart rate in the 40s with EMS initially.  He received 700 cc NS IVF prior to arrival.  Currently patient is asymptomatic but SBP 90 on my exam. Questionable systolic murmur noted at apex.   We will obtain labs, EKG.  Given syncope with head trauma, age will obtain head CT.  We will give 1 L IV fluid.  Orthostatics ordered.  Suspect syncope and hypotension due to vasodilation from Viagra, possibly orthostatic hypotension.  He has had minimal fluid intake today.  I considered cardiac etiology such as arrhythmia less likely.  Doubt CVA given history.  He has normal neuro exam here.  0255: ER work-up reviewed by me, EKG shows borderline prolonged PR interval.  Labs essentially normal.  Head CT is normal.  Patient has achieved almost 2 L IV fluid here.  Orthostatics were  rechecked.  SBP in the low 80s with sitting up.  There was an increase of greater than 20 in his heart rate as well.  He is asymptomatic during this however given his prolonged hypotension, age, initial hypotension with EMS will discuss with medicine team for admission for hypotension likely from vasodilator.  He has never had an echocardiogram and given his age, may benefit from this.  Shared with EDP. Final Clinical Impressions(s) / ED Diagnoses   Final diagnoses:  Syncope and collapse  Hypotension due to drugs    ED  Discharge Orders    None       Arlean Hopping 81/18/86 [id number]    Delora Fuel, MD 36/68/15 [phone]

## 2019-07-15 NOTE — ED Triage Notes (Signed)
Pt coming from home after taking Viagra and falling after intercourse. Pt hit head on trash can according to family. EMS states that family said pt "stopped breathing" on scene and that they "performed CPR". Pt was A&O x4 when EMS arrived. Systolic BP initially in the 60s and HR in the 60R. 96 systolic BP upon arrival.. Received 700 cc fluid bolus

## 2019-07-16 ENCOUNTER — Emergency Department (HOSPITAL_COMMUNITY): Payer: No Typology Code available for payment source

## 2019-07-16 ENCOUNTER — Observation Stay (HOSPITAL_BASED_OUTPATIENT_CLINIC_OR_DEPARTMENT_OTHER): Payer: No Typology Code available for payment source

## 2019-07-16 ENCOUNTER — Encounter (HOSPITAL_COMMUNITY): Payer: Self-pay | Admitting: Internal Medicine

## 2019-07-16 DIAGNOSIS — N183 Chronic kidney disease, stage 3 unspecified: Secondary | ICD-10-CM | POA: Diagnosis present

## 2019-07-16 DIAGNOSIS — F319 Bipolar disorder, unspecified: Secondary | ICD-10-CM | POA: Diagnosis not present

## 2019-07-16 DIAGNOSIS — R55 Syncope and collapse: Secondary | ICD-10-CM

## 2019-07-16 DIAGNOSIS — H9190 Unspecified hearing loss, unspecified ear: Secondary | ICD-10-CM

## 2019-07-16 DIAGNOSIS — I10 Essential (primary) hypertension: Secondary | ICD-10-CM | POA: Diagnosis present

## 2019-07-16 DIAGNOSIS — I951 Orthostatic hypotension: Secondary | ICD-10-CM | POA: Diagnosis present

## 2019-07-16 DIAGNOSIS — Z720 Tobacco use: Secondary | ICD-10-CM | POA: Diagnosis present

## 2019-07-16 LAB — CBC WITH DIFFERENTIAL/PLATELET
Abs Immature Granulocytes: 0.05 10*3/uL (ref 0.00–0.07)
Basophils Absolute: 0 10*3/uL (ref 0.0–0.1)
Basophils Relative: 0 %
Eosinophils Absolute: 0.1 10*3/uL (ref 0.0–0.5)
Eosinophils Relative: 1 %
HCT: 41.3 % (ref 39.0–52.0)
Hemoglobin: 14.9 g/dL (ref 13.0–17.0)
Immature Granulocytes: 0 %
Lymphocytes Relative: 15 %
Lymphs Abs: 1.8 10*3/uL (ref 0.7–4.0)
MCH: 33.1 pg (ref 26.0–34.0)
MCHC: 36.1 g/dL — ABNORMAL HIGH (ref 30.0–36.0)
MCV: 91.8 fL (ref 80.0–100.0)
Monocytes Absolute: 0.9 10*3/uL (ref 0.1–1.0)
Monocytes Relative: 7 %
Neutro Abs: 9.2 10*3/uL — ABNORMAL HIGH (ref 1.7–7.7)
Neutrophils Relative %: 77 %
Platelets: 109 10*3/uL — ABNORMAL LOW (ref 150–400)
RBC: 4.5 MIL/uL (ref 4.22–5.81)
RDW: 11.6 % (ref 11.5–15.5)
WBC: 12.1 10*3/uL — ABNORMAL HIGH (ref 4.0–10.5)
nRBC: 0 % (ref 0.0–0.2)

## 2019-07-16 LAB — CBC
HCT: 38.4 % — ABNORMAL LOW (ref 39.0–52.0)
Hemoglobin: 13.5 g/dL (ref 13.0–17.0)
MCH: 33 pg (ref 26.0–34.0)
MCHC: 35.2 g/dL (ref 30.0–36.0)
MCV: 93.9 fL (ref 80.0–100.0)
Platelets: 97 10*3/uL — ABNORMAL LOW (ref 150–400)
RBC: 4.09 MIL/uL — ABNORMAL LOW (ref 4.22–5.81)
RDW: 11.6 % (ref 11.5–15.5)
WBC: 7.8 10*3/uL (ref 4.0–10.5)
nRBC: 0 % (ref 0.0–0.2)

## 2019-07-16 LAB — BASIC METABOLIC PANEL
Anion gap: 2 — ABNORMAL LOW (ref 5–15)
BUN: 13 mg/dL (ref 8–23)
CO2: 27 mmol/L (ref 22–32)
Calcium: 8.2 mg/dL — ABNORMAL LOW (ref 8.9–10.3)
Chloride: 107 mmol/L (ref 98–111)
Creatinine, Ser: 1 mg/dL (ref 0.61–1.24)
GFR calc Af Amer: >60 mL/min (ref >60)
GFR calc non Af Amer: >60 mL/min (ref >60)
Glucose, Bld: 117 mg/dL — ABNORMAL HIGH (ref 70–99)
Potassium: 3.4 mmol/L — ABNORMAL LOW (ref 3.5–5.1)
Sodium: 136 mmol/L (ref 135–145)

## 2019-07-16 LAB — CBG MONITORING, ED: Glucose-Capillary: 99 mg/dL (ref 70–99)

## 2019-07-16 LAB — ECHOCARDIOGRAM COMPLETE
Height: 71 in
Weight: 3200 oz

## 2019-07-16 LAB — SARS CORONAVIRUS 2 (TAT 6-24 HRS): SARS Coronavirus 2: NEGATIVE

## 2019-07-16 MED ORDER — PERFLUTREN LIPID MICROSPHERE
1.0000 mL | INTRAVENOUS | Status: AC | PRN
  Administered 2019-07-16: 11:00:00 3 mL via INTRAVENOUS
  Filled 2019-07-16: qty 10

## 2019-07-16 MED ORDER — ENOXAPARIN SODIUM 40 MG/0.4ML ~~LOC~~ SOLN
40.0000 mg | SUBCUTANEOUS | Status: DC
  Filled 2019-07-16: qty 0.4

## 2019-07-16 MED ORDER — PROPRANOLOL HCL 40 MG PO TABS: 40.0000 mg | ORAL_TABLET | Freq: Every day | ORAL | Status: DC

## 2019-07-16 MED ORDER — POTASSIUM CHLORIDE IN NACL 20-0.9 MEQ/L-% IV SOLN
INTRAVENOUS | Status: AC
  Administered 2019-07-16: 06:00:00 via INTRAVENOUS
  Filled 2019-07-16: qty 1000

## 2019-07-16 MED ORDER — ACETAMINOPHEN 650 MG RE SUPP: 650.0000 mg | Freq: Four times a day (QID) | RECTAL | Status: DC | PRN

## 2019-07-16 MED ORDER — SODIUM CHLORIDE 0.9 % IV BOLUS
1000.0000 mL | Freq: Once | INTRAVENOUS | Status: AC
Start: 2019-07-16 — End: 2019-07-16
  Administered 2019-07-16: 1000 mL via INTRAVENOUS

## 2019-07-16 MED ORDER — POTASSIUM CHLORIDE CRYS ER 20 MEQ PO TBCR
20.0000 meq | EXTENDED_RELEASE_TABLET | Freq: Once | ORAL | Status: AC
  Administered 2019-07-16: 04:00:00 20 meq via ORAL
  Filled 2019-07-16: qty 1

## 2019-07-16 MED ORDER — ACETAMINOPHEN 325 MG PO TABS: 650.0000 mg | ORAL_TABLET | Freq: Four times a day (QID) | ORAL | Status: DC | PRN

## 2019-07-16 MED ORDER — ONDANSETRON HCL 4 MG PO TABS: 4.0000 mg | ORAL_TABLET | Freq: Four times a day (QID) | ORAL | Status: DC | PRN

## 2019-07-16 MED ORDER — ONDANSETRON HCL 4 MG/2ML IJ SOLN: 4.0000 mg | Freq: Four times a day (QID) | INTRAMUSCULAR | Status: DC | PRN

## 2019-07-16 MED ORDER — PROPRANOLOL HCL 40 MG PO TABS: 40.0000 mg | ORAL_TABLET | Freq: Every day | ORAL | 1 refills | Status: DC

## 2019-07-16 MED ORDER — SODIUM CHLORIDE 0.9% FLUSH
3.0000 mL | Freq: Two times a day (BID) | INTRAVENOUS | Status: DC
  Administered 2019-07-16 (×2): 3 mL via INTRAVENOUS

## 2019-07-16 MED FILL — PROPRANOLOL 40 MG TABLET: 40 | 30 days supply | Qty: 30 | Fill #0

## 2019-07-16 NOTE — ED Notes (Signed)
Tele ordered bfast 

## 2019-07-16 NOTE — ED Notes (Signed)
Patient ambulated with a steady gait. When asked how the pt felt pt stated "Good" This NT asked the pt if he felt dizzy pt stated "no just tired" Rosemarie Ax, PA-C has been notified.

## 2019-07-16 NOTE — H&P (Signed)
History and Physical    Austin Wells DGL:[id number] DOB: 04-14-1946 DOA: 07/15/2019  PCP: System, Pcp Not In   Patient coming from: Home   Chief Complaint: Syncope   HPI: Austin Wells is a 73 y.o. male with medical history significant for bipolar disorder, hypertension, and hyperlipidemia, now presenting to the emergency department for evaluation of syncope.  Patient reports that he had been in his usual state of health and was having an uneventful day when he was making a sandwich and had a syncopal episode.  He had taken Viagra 100 mg an hour or so prior to this.  Family was not sure if he was breathing or not and began CPR and contacted EMS.  Patient reportedly had a second syncopal episode when he tried to stand up, had systolic blood pressure in the 60s with EMS, and was given 700 cc normal saline prior to arrival in the ED.  Patient denies any chest pain or palpitations associated with this, denies any nausea or diaphoresis, and has never experienced this previously.  He reports that he has used Viagra for several years without any problems.  He denies any recent vomiting or diarrhea.  Denies any leg swelling or tenderness.  No cough, fever, or shortness of breath.  ED Course: Upon arrival to the ED, patient is found to be afebrile, saturating well on room air, and with systolic blood pressure of 91.  EKG features a sinus rhythm with first-degree AV nodal block.  Noncontrast head CT is negative for acute intracranial abnormality.  Chemistry panel features a potassium of 3.4 and creatinine 1.29, similar to prior.  CBC is notable for a leukocytosis to 12,100 and thrombocytopenia with platelets 109,000.  Patient was given 700 cc normal saline prior to arrival in the ED, received another 1.5 L of normal saline thus far in the emergency department, feels better, but continues to have orthostatic hypotension.  Review of Systems:  All other systems reviewed and apart from HPI, are negative.  Past  Medical History:  Diagnosis Date  . Depression   . Hyperlipidemia   . Hypertension     No past surgical history on file.   reports that he has been smoking. He does not have any smokeless tobacco history on file. He reports current alcohol use. No history on file for drug.  No Known Allergies  No family history on file.   Prior to Admission medications   Medication Sig Start Date End Date Taking? Authorizing Provider  ARIPiprazole (ABILIFY) 5 MG tablet Take 5 mg by mouth daily. For mood stabilization and depression    [provider]  calcium-vitamin D (OSCAL WITH D) 500-200 MG-UNIT tablet Take 1 tablet by mouth daily.    [provider]  Cholecalciferol (VITAMIN D) 2000 units tablet Take 2,000 Units by mouth daily.    [provider]  cyanocobalamin 500 MCG tablet Take 500 mcg by mouth daily. Vitamin B12    [provider]  divalproex (DEPAKOTE ER) 500 MG 24 hr tablet Take 2 tablets (1,000 mg total) by mouth 2 (two) times daily. For mood stabilization. Patient not taking: Reported on 02/03/2017 06/19/13   Nena Polio T, PA-C  fish oil-omega-3 fatty acids 1000 MG capsule Take 2 capsules (2 g total) by mouth daily. Cholesterol and lipid control. Patient not taking: Reported on 02/03/2017 06/19/13   Nena Polio T, PA-C  hydrochlorothiazide (HYDRODIURIL) 25 MG tablet Take 12.5 mg by mouth daily. For blood pressure    [provider]  hydrochlorothiazide (MICROZIDE) 12.5 MG capsule Take 1 capsule (12.5 mg total) by mouth daily. Patient not taking: Reported on 02/03/2017 06/19/13   Nena Polio T, PA-C  lisinopril (PRINIVIL,ZESTRIL) 40 MG tablet Take 1 tablet (40 mg total) by mouth daily. For hypertension. Patient taking differently: Take 40 mg by mouth daily. For blood pressure 06/19/13   Ruben Im, PA-C  Magnesium Oxide 420 MG TABS Take 420 mg by mouth daily. For muscle cramps    [provider]  mirtazapine (REMERON) 15 MG  tablet Take 15 mg by mouth at bedtime. For tremors    [provider]  Multiple Vitamin (MULTIVITAMIN WITH MINERALS) TABS tablet Take 1 tablet by mouth daily. For nutritional supplement 06/19/13   Nena Polio T, PA-C  pravastatin (PRAVACHOL) 40 MG tablet Take 1 tablet (40 mg total) by mouth every evening. For cholesterol and lipid control. Patient taking differently: Take 40 mg by mouth at bedtime. For cholesterol and lipid control. 06/19/13   Ruben Im, PA-C  propranolol (INDERAL) 80 MG tablet Take 80 mg by mouth daily. For tremors    [provider]  sildenafil (VIAGRA) 100 MG tablet Take 50 mg by mouth daily as needed for erectile dysfunction.    [provider]  traMADol (ULTRAM) 50 MG tablet Take 1 tablet (50 mg total) by mouth every 6 (six) hours as needed for pain. Patient not taking: Reported on 02/03/2017 06/19/13   Nena Polio T, PA-C  traZODone (DESYREL) 50 MG tablet Take 1 tablet (50 mg total) by mouth at bedtime as needed and may repeat dose one time if needed for sleep. Patient taking differently: Take 50 mg by mouth at bedtime as needed for sleep.  06/19/13   Ruben Im, PA-C    Physical Exam: Vitals:   07/15/19 2300 07/15/19 2315 07/16/19 0000 07/16/19 0215  BP: 102/72 115/75 113/79   Pulse: (!) 56 (!) 56 69   Resp: 12 13 15 16   Temp:      TempSrc:      SpO2: 98% 100% 97%   Weight:      Height:        Constitutional: NAD, calm  Eyes: PERTLA, lids and conjunctivae normal ENMT: Mucous membranes are moist. Posterior pharynx clear of any exudate or lesions.   Neck: normal, supple, no masses, no thyromegaly Respiratory:  no wheezing, no crackles. Normal respiratory effort. No accessory muscle use.  Cardiovascular: S1 & S2 heard, regular rate and rhythm. No extremity edema.   Abdomen: No distension, no tenderness, soft. Bowel sounds normal.  Musculoskeletal: no clubbing / cyanosis. No joint deformity upper and lower extremities.     Skin: no significant rashes, lesions, ulcers. Warm, dry, well-perfused. Neurologic: No gross facial asymmetry. Gross hearing loss. Mild dysarthria. Sensation intact. Strength 5/5 in all 4 limbs.  Psychiatric:  Alert and oriented to person, place, and situation. Calm, cooperative.    Labs on Admission: I have personally reviewed following labs and imaging studies  CBC: Recent Labs  Lab 07/15/19 2308  WBC 12.1*  NEUTROABS 9.2*  HGB 14.9  HCT 41.3  MCV 91.8  PLT 245*   Basic Metabolic Panel: Recent Labs  Lab 07/15/19 2308  NA 136  K 3.4*  CL 103  CO2 24  GLUCOSE 114*  BUN 14  CREATININE 1.29*  CALCIUM 9.0   GFR: Estimated Creatinine Clearance: 60.5 mL/min (A) (by C-G formula based on SCr of 1.29 mg/dL (H)). Liver Function Tests: Recent Labs  Lab 07/15/19 2308  AST 21  ALT 19  ALKPHOS 37*  BILITOT 0.9  PROT 6.5  ALBUMIN 3.8   No results for input(s): LIPASE, AMYLASE in the last 168 hours. No results for input(s): AMMONIA in the last 168 hours. Coagulation Profile: No results for input(s): INR, PROTIME in the last 168 hours. Cardiac Enzymes: No results for input(s): CKTOTAL, CKMB, CKMBINDEX, TROPONINI in the last 168 hours. BNP (last 3 results) No results for input(s): PROBNP in the last 8760 hours. HbA1C: No results for input(s): HGBA1C in the last 72 hours. CBG: Recent Labs  Lab 07/15/19 2311  GLUCAP 102*   Lipid Profile: No results for input(s): CHOL, HDL, LDLCALC, TRIG, CHOLHDL, LDLDIRECT in the last 72 hours. Thyroid Function Tests: No results for input(s): TSH, T4TOTAL, FREET4, T3FREE, THYROIDAB in the last 72 hours. Anemia Panel: No results for input(s): VITAMINB12, FOLATE, FERRITIN, TIBC, IRON, RETICCTPCT in the last 72 hours. Urine analysis:    Component Value Date/Time   COLORURINE YELLOW 02/03/2017 1654   APPEARANCEUR HAZY (A) 02/03/2017 1654   LABSPEC 1.014 02/03/2017 1654   PHURINE 6.0 02/03/2017 1654   GLUCOSEU NEGATIVE 02/03/2017  1654   HGBUR NEGATIVE 02/03/2017 1654   BILIRUBINUR NEGATIVE 02/03/2017 1654   KETONESUR NEGATIVE 02/03/2017 1654   PROTEINUR NEGATIVE 02/03/2017 1654   UROBILINOGEN 1.0 06/13/2013 1408   NITRITE NEGATIVE 02/03/2017 1654   LEUKOCYTESUR NEGATIVE 02/03/2017 1654   Sepsis Labs: @LABRCNTIP (procalcitonin:4,lacticidven:4) )No results found for this or any previous visit (from the past 240 hour(s)).   Radiological Exams on Admission: Ct Head Wo Contrast  Result Date: 07/16/2019 CLINICAL DATA:  Syncope, no other cardiac symptoms took viagra had sex, got up passed out, hypotensive in ER. +head trauma, benign exam otherwise EXAM: CT HEAD WITHOUT CONTRAST TECHNIQUE: Contiguous axial images were obtained from the base of the skull through the vertex without intravenous contrast. COMPARISON:  None. FINDINGS: Brain: Brain volume is normal for age. No intracranial hemorrhage, mass effect, or midline shift. No hydrocephalus. The basilar cisterns are patent. No evidence of territorial infarct or acute ischemia. No extra-axial or intracranial fluid collection. Vascular: No hyperdense vessel. Skull: No fracture or focal lesion. Sinuses/Orbits: Mild mucosal thickening of scattered ethmoid air cells. Sclerosis and opacification of lower right mastoid air cells. Included orbits are unremarkable. Other: None. IMPRESSION: No acute intracranial abnormality. Electronically Signed   By: Keith Rake M.D.   On: 07/16/2019 00:40    EKG: Independently reviewed. Sinus rhythm, 1st degree AV block, similar to prior.   Assessment/Plan   1. Syncope; orthostatic hypotension  - Presents after 2 syncopal episodes that occurred upon standing and after taking Viagra, found to have SBP 60's with EMS, improved to 90's with IVF prior to arrival in ED  - EKG with 1st degree AVB, similar to prior  - Patient reported feeling better after IVF in ED, and resting BP is now normal, but there continues to be >20 mmHg drop in SBP upon  sitting  - Syncope likely orthostatic and secondary to Viagra, though he reports taking it without incident for years; he has never had an echocardiogram  - Continue cardiac monitoring, continue IVF hydration, hold antihypertensives, check echocardiogram, and repeat orthostatic vitals   2. Hypertension  - Presents with hypotension, improved with IVF prior to arrival  - Continue to hold antihypertensives    3. Bipolar disorder  - Continue home regimen pending pharmacy med-rec   4. CKD III  - SCr is 1.29 on admission, similar to priors  - Renally-dose medications  PPE: Mask, face shield  DVT prophylaxis: Lovenox  Code Status: Full  Family Communication: Discussed with patient  Consults called: none  Admission status: Observation     Vianne Bulls, MD [facility] Pager [phone]  If 7PM-7AM, please contact night-coverage [email or web address] Password [email or web address]  07/16/2019, 3:21 AM

## 2019-07-16 NOTE — ED Notes (Signed)
Pt ambulated to bathroom without difficulty.  Denied dizziness.  Eating lunch.

## 2019-07-16 NOTE — Discharge Instructions (Signed)
Hypotension °As your heart beats, it forces blood through your body. This force is called blood pressure. If you have hypotension, you have low blood pressure. When your blood pressure is too low, you may not get enough blood to your brain or other parts of your body. This may cause you to feel weak, light-headed, have a fast heartbeat, or even pass out (faint). Low blood pressure may be harmless, or it may cause serious problems. °What are the causes? °· Blood loss. °· Not enough water in the body (dehydration). °· Heart problems. °· Hormone problems. °· Pregnancy. °· A very bad infection. °· Not having enough of certain nutrients. °· Very bad allergic reactions. °· Certain medicines. °What increases the risk? °· Age. The risk increases as you get older. °· Conditions that affect the heart or the brain and spinal cord (central nervous system). °· Taking certain medicines. °· Being pregnant. °What are the signs or symptoms? °· Feeling: °? Weak. °? Light-headed. °? Dizzy. °? Tired (fatigued). °· Blurred vision. °· Fast heartbeat. °· Passing out, in very bad cases. °How is this treated? °· Changing your diet. This may involve eating more salt (sodium) or drinking more water. °· Taking medicines to raise your blood pressure. °· Changing how much you take (the dosage) of some of your medicines. °· Wearing compression stockings. These stockings help to prevent blood clots and reduce swelling in your legs. °In some cases, you may need to go to the hospital for: °· Fluid replacement. This means you will receive fluids through an IV tube. °· Blood replacement. This means you will receive donated blood through an IV tube (transfusion). °· Treating an infection or heart problems, if this applies. °· Monitoring. You may need to be monitored while medicines that you are taking wear off. °Follow these instructions at home: °Eating and drinking ° °· Drink enough fluids to keep your pee (urine) pale yellow. °· Eat a healthy diet.  Follow instructions from your doctor about what you can eat or drink. A healthy diet includes: °? Fresh fruits and vegetables. °? Whole grains. °? Low-fat (lean) meats. °? Low-fat dairy products. °· Eat extra salt only as told. Do not add extra salt to your diet unless your doctor tells you to. °· Eat small meals often. °· Avoid standing up quickly after you eat. °Medicines °· Take over-the-counter and prescription medicines only as told by your doctor. °? Follow instructions from your doctor about changing how much you take of your medicines, if this applies. °? Do not stop or change any of your medicines on your own. °General instructions ° °· Wear compression stockings as told by your doctor. °· Get up slowly from lying down or sitting. °· Avoid hot showers and a lot of heat as told by your doctor. °· Return to your normal activities as told by your doctor. Ask what activities are safe for you. °· Do not use any products that contain nicotine or tobacco, such as cigarettes, e-cigarettes, and chewing tobacco. If you need help quitting, ask your doctor. °· Keep all follow-up visits as told by your doctor. This is important. °Contact a doctor if: °· You throw up (vomit). °· You have watery poop (diarrhea). °· You have a fever for more than 2-3 days. °· You feel more thirsty than normal. °· You feel weak and tired. °Get help right away if: °· You have chest pain. °· You have a fast or uneven heartbeat. °· You lose feeling (have numbness) in any   part of your body. °· You cannot move your arms or your legs. °· You have trouble talking. °· You get sweaty or feel light-headed. °· You pass out. °· You have trouble breathing. °· You have trouble staying awake. °· You feel mixed up (confused). °Summary °· Hypotension is also called low blood pressure. It is when the force of blood pumping through your arteries is too weak. °· Hypotension may be harmless, or it may cause serious problems. °· Treatment may include changing  your diet and medicines, and wearing compression stockings. °· In very bad cases, you may need to go to the hospital. °This information is not intended to replace advice given to you by your health care provider. Make sure you discuss any questions you have with your health care provider. °Document Released: 01/03/2010 Document Revised: 04/04/2018 Document Reviewed: 04/04/2018 °Elsevier Patient Education © 2020 [employer] ° °

## 2019-07-16 NOTE — Discharge Summary (Addendum)
Physician Discharge Summary  Austin Wells HDQ:[id number] DOB: 09-21-1946 DOA: 07/15/2019  PCP: System, Pcp Not In  Admit date: 07/15/2019 Discharge date: 07/16/2019  Time spent: 45 minutes  Recommendations for Outpatient Follow-up:  1. Follow up with PCP 1-2 weeks for evaluation of BP control as HCTZ has been stopped and BB (prescribed for tremors) has been reduced. Also instructed to stop Viagra until BP evaluated   Discharge Diagnoses:  Principal Problem:   Syncope Active Problems:   Orthostatic hypotension   Hypertension   CKD (chronic kidney disease), stage III (HCC)   Hard of hearing   Bipolar I disorder ([place])   Tobacco use   Discharge Condition: stable  Diet recommendation: heart healthy  Filed Weights   07/15/19 2223  Weight: 90.7 kg    History of present illness:  Austin Wells is a 73 y.o. male with medical history significant for bipolar disorder, hypertension, and hyperlipidemia, presented 9/23 to the emergency department for evaluation of syncope.  Patient reported that he had been in his usual state of health and was having an uneventful day when he was making a sandwich and had a syncopal episode.  He had taken Viagra 100 mg an hour or so prior to this.  Family was not sure if he was breathing or not and began CPR and contacted EMS.  Patient reportedly had a second syncopal episode when he tried to stand up, had systolic blood pressure in the 60s with EMS, and was given 700 cc normal saline prior to arrival in the ED.  Patient denied any chest pain or palpitations associated with this, denied any nausea or diaphoresis, and has never experienced this previously.  He reported that he has used Viagra for several years without any problems.  He denied any recent vomiting or diarrhea.  Denied any leg swelling or tenderness.  No cough, fever, or shortness of breath.  Hospital Course:  . Syncope; orthostatic hypotension  Presentrf after 2 syncopal episodes that occurred upon  standing and after taking Viagra, found to have SBP 60's with EMS, improved to 90's with IVF prior to arrival in ED. Home meds include HCTZ as well. Suspect dehydration in setting of vasodilation with viagra.  EKG with 1st degree AVB, similar to prior  He was orthostatic in ED after IV fluid of  >20 mmHg drop in SBP upon sitting. Continued Vigorous IV fluids. Echo revealed EF 75%, no left ventricular hypertrophy, no right ventricle wall thickness. On day of discharge sitting SBP 125 down to 108 with standing and 118 after standing for 3 minutes. No complaints of dizziness/lightheadedness. Ambulated to BP with steady gait and no sob or dizziness. Will stop viagra and HCTZ and reduce dose of BB as well as encourage fluids. Follow up with PCP for evaluation of symptoms and resumption of med  2. Hypertension  Presented with hypotension, improved with IVF prior to arrival. All antihypertensive meds held. Provided vigorous IV fluids. Stop HCTZ and reduce BB at discharge. Follow up with PCP 1-2 weeks for evaluation of BP control    3. Bipolar disorder Stable at baseline.    4. CKD III  - SCr is 1.29 on admission, similar to priors      Procedures:    Consultations:    Discharge Exam: Vitals:   07/16/19 1200 07/16/19 1401  BP: 115/62 120/66  Pulse: (!) 51 (!) 56  Resp: 19 20  Temp:    SpO2: 98% 97%    General: awake alert very HOH, somewhat thin  Cardiovascular: rrr no mgr no LE edema Respiratory: normal effort BS clear bilaterally no wheeze  Discharge Instructions   Discharge Instructions    Call MD for:  difficulty breathing, headache or visual disturbances   Complete by: As directed    Call MD for:  persistant dizziness or light-headedness   Complete by: As directed    Call MD for:  severe uncontrolled pain   Complete by: As directed    Diet - low sodium heart healthy   Complete by: As directed    Discharge instructions   Complete by: As directed    Stop HCTZ and viagra  until follow up appointment with PCP Drink plenty of fluids   Increase activity slowly   Complete by: As directed      Allergies as of 07/16/2019   No Known Allergies     Medication List    STOP taking these medications   hydrochlorothiazide 12.5 MG capsule Commonly known as: MICROZIDE   hydrochlorothiazide 25 MG tablet Commonly known as: HYDRODIURIL   sildenafil 100 MG tablet Commonly known as: VIAGRA     TAKE these medications   ARIPiprazole 5 MG tablet Commonly known as: ABILIFY Take 5 mg by mouth daily. For mood stabilization and depression   calcium-vitamin D 500-200 MG-UNIT tablet Commonly known as: OSCAL WITH D Take 1 tablet by mouth daily.   divalproex 500 MG 24 hr tablet Commonly known as: DEPAKOTE ER Take 2 tablets (1,000 mg total) by mouth 2 (two) times daily. For mood stabilization.   fish oil-omega-3 fatty acids 1000 MG capsule Take 2 capsules (2 g total) by mouth daily. Cholesterol and lipid control.   lisinopril 40 MG tablet Commonly known as: ZESTRIL Take 1 tablet (40 mg total) by mouth daily. For hypertension. What changed: additional instructions   Magnesium Oxide 420 MG Tabs Take 420 mg by mouth daily. For muscle cramps   mirtazapine 15 MG tablet Commonly known as: REMERON Take 15 mg by mouth at bedtime. For tremors   multivitamin with minerals Tabs tablet Take 1 tablet by mouth daily. For nutritional supplement   pravastatin 40 MG tablet Commonly known as: PRAVACHOL Take 1 tablet (40 mg total) by mouth every evening. For cholesterol and lipid control. What changed: when to take this   propranolol 40 MG tablet Commonly known as: INDERAL Take 1 tablet (40 mg total) by mouth daily. Start taking on: July 17, 2019 What changed:   medication strength  how much to take  additional instructions   traMADol 50 MG tablet Commonly known as: ULTRAM Take 1 tablet (50 mg total) by mouth every 6 (six) hours as needed for pain.    traZODone 50 MG tablet Commonly known as: DESYREL Take 1 tablet (50 mg total) by mouth at bedtime as needed and may repeat dose one time if needed for sleep. What changed: when to take this   vitamin B-12 500 MCG tablet Commonly known as: CYANOCOBALAMIN Take 500 mcg by mouth daily. Vitamin B12   Vitamin D 50 MCG (2000 UT) tablet Take 2,000 Units by mouth daily.      No Known Allergies    The results of significant diagnostics from this hospitalization (including imaging, microbiology, ancillary and laboratory) are listed below for reference.    Significant Diagnostic Studies: Ct Head Wo Contrast  Result Date: 07/16/2019 CLINICAL DATA:  Syncope, no other cardiac symptoms took viagra had sex, got up passed out, hypotensive in ER. +head trauma, benign exam otherwise EXAM: CT HEAD WITHOUT  CONTRAST TECHNIQUE: Contiguous axial images were obtained from the base of the skull through the vertex without intravenous contrast. COMPARISON:  None. FINDINGS: Brain: Brain volume is normal for age. No intracranial hemorrhage, mass effect, or midline shift. No hydrocephalus. The basilar cisterns are patent. No evidence of territorial infarct or acute ischemia. No extra-axial or intracranial fluid collection. Vascular: No hyperdense vessel. Skull: No fracture or focal lesion. Sinuses/Orbits: Mild mucosal thickening of scattered ethmoid air cells. Sclerosis and opacification of lower right mastoid air cells. Included orbits are unremarkable. Other: None. IMPRESSION: No acute intracranial abnormality. Electronically Signed   By: Keith Rake M.D.   On: 07/16/2019 00:40    Microbiology: No results found for this or any previous visit (from the past 240 hour(s)).   Labs: Basic Metabolic Panel: Recent Labs  Lab 07/15/19 2308 07/16/19 0405  NA 136 136  K 3.4* 3.4*  CL 103 107  CO2 24 27  GLUCOSE 114* 117*  BUN 14 13  CREATININE 1.29* 1.00  CALCIUM 9.0 8.2*   Liver Function Tests: Recent  Labs  Lab 07/15/19 2308  AST 21  ALT 19  ALKPHOS 37*  BILITOT 0.9  PROT 6.5  ALBUMIN 3.8   No results for input(s): LIPASE, AMYLASE in the last 168 hours. No results for input(s): AMMONIA in the last 168 hours. CBC: Recent Labs  Lab 07/15/19 2308 07/16/19 0405  WBC 12.1* 7.8  NEUTROABS 9.2*  --   HGB 14.9 13.5  HCT 41.3 38.4*  MCV 91.8 93.9  PLT 109* 97*   Cardiac Enzymes: No results for input(s): CKTOTAL, CKMB, CKMBINDEX, TROPONINI in the last 168 hours. BNP: BNP (last 3 results) No results for input(s): BNP in the last 8760 hours.  ProBNP (last 3 results) No results for input(s): PROBNP in the last 8760 hours.  CBG: Recent Labs  Lab 07/15/19 2311 07/16/19 0803  GLUCAP 102* 99       Signed:  Radene Gunning NP [facility] 07/16/2019, 2:17 PM

## 2019-07-16 NOTE — Progress Notes (Signed)
  Echocardiogram 2D Echocardiogram has been performed.  Burnett Kanaris 07/16/2019, 10:53 AM

## 2019-07-16 NOTE — ED Notes (Signed)
Diet was ordered for Lunch. 

## 2020-05-24 ENCOUNTER — Emergency Department (HOSPITAL_COMMUNITY): Payer: No Typology Code available for payment source

## 2020-05-24 ENCOUNTER — Encounter (HOSPITAL_COMMUNITY): Payer: Self-pay | Admitting: *Deleted

## 2020-05-24 ENCOUNTER — Emergency Department (HOSPITAL_COMMUNITY)
Admission: EM | Admit: 2020-05-24 | Discharge: 2020-05-24 | Disposition: A | Discharge status: Home / Self Care | Payer: No Typology Code available for payment source | Attending: Emergency Medicine | Admitting: Emergency Medicine

## 2020-05-24 ENCOUNTER — Other Ambulatory Visit: Payer: Self-pay

## 2020-05-24 DIAGNOSIS — F17200 Nicotine dependence, unspecified, uncomplicated: Secondary | ICD-10-CM | POA: Insufficient documentation

## 2020-05-24 DIAGNOSIS — W01198A Fall on same level from slipping, tripping and stumbling with subsequent striking against other object, initial encounter: Secondary | ICD-10-CM | POA: Diagnosis not present

## 2020-05-24 DIAGNOSIS — M7918 Myalgia, other site: Secondary | ICD-10-CM | POA: Insufficient documentation

## 2020-05-24 DIAGNOSIS — N183 Chronic kidney disease, stage 3 unspecified: Secondary | ICD-10-CM | POA: Diagnosis not present

## 2020-05-24 DIAGNOSIS — M546 Pain in thoracic spine: Secondary | ICD-10-CM | POA: Diagnosis not present

## 2020-05-24 DIAGNOSIS — M545 Low back pain: Secondary | ICD-10-CM | POA: Insufficient documentation

## 2020-05-24 DIAGNOSIS — Z79899 Other long term (current) drug therapy: Secondary | ICD-10-CM | POA: Diagnosis not present

## 2020-05-24 DIAGNOSIS — Y999 Unspecified external cause status: Secondary | ICD-10-CM | POA: Insufficient documentation

## 2020-05-24 DIAGNOSIS — S2241XA Multiple fractures of ribs, right side, initial encounter for closed fracture: Secondary | ICD-10-CM | POA: Insufficient documentation

## 2020-05-24 DIAGNOSIS — I129 Hypertensive chronic kidney disease with stage 1 through stage 4 chronic kidney disease, or unspecified chronic kidney disease: Secondary | ICD-10-CM | POA: Diagnosis not present

## 2020-05-24 DIAGNOSIS — Y939 Activity, unspecified: Secondary | ICD-10-CM | POA: Diagnosis not present

## 2020-05-24 DIAGNOSIS — Y929 Unspecified place or not applicable: Secondary | ICD-10-CM | POA: Insufficient documentation

## 2020-05-24 DIAGNOSIS — S299XXA Unspecified injury of thorax, initial encounter: Secondary | ICD-10-CM | POA: Diagnosis present

## 2020-05-24 MED ORDER — OXYCODONE-ACETAMINOPHEN 5-325 MG PO TABS
1.0000 | ORAL_TABLET | Freq: Once | ORAL | Status: AC
  Administered 2020-05-24: 1 via ORAL
  Filled 2020-05-24: qty 1

## 2020-05-24 NOTE — ED Provider Notes (Signed)
[facility] EMERGENCY DEPARTMENT Provider Note   CSN: [id number] Arrival date & time: 05/24/20  1129     History Chief Complaint  Patient presents with  . Fall    Brandan Robicheaux is a 74 y.o. male emergency department with home discharge.  Patient presents he slipped in the shower yesterday.  He states that he lost his footing climbing out.  He fell on his butt and may have struck his back on something.  Denies head trauma or LOC.  Not on AC.  He reports pain in his mid-right back that is worse with movement and inspiration.  Had difficulty sleeping at night.  Pain improved with tylenol at home today.  Currently pain is sharp, localized, does not radiate, and 10/10.  He denies HA or neck pain.  Denies numbness or weakness in his arms or legs.  HPI     Past Medical History:  Diagnosis Date  . Depression   . Hyperlipidemia   . Hypertension   . Orthostatic hypotension   . Syncope and collapse   . Tobacco use     Patient Active Problem List   Diagnosis Date Noted  . Syncope 07/16/2019  . Orthostatic hypotension 07/16/2019  . Hard of hearing 07/16/2019  . Tobacco use 07/16/2019  . Hypertension   . CKD (chronic kidney disease), stage III   . Bipolar I disorder ([place]) 06/16/2013  . Suicide attempt ([place]) 06/15/2013  . Suicidal ideation 06/15/2013  . MDD (major depressive disorder) 06/15/2013    History reviewed. No pertinent surgical history.     No family history on file.  Social History   Tobacco Use  . Smoking status: Current Every Day Smoker  . Smokeless tobacco: Never Used  Substance Use Topics  . Alcohol use: Yes  . Drug use: Never    Home Medications Prior to Admission medications   Medication Sig Start Date End Date Taking? Authorizing Provider  ARIPiprazole (ABILIFY) 5 MG tablet Take 5 mg by mouth daily. For mood stabilization and depression    [provider]  calcium-vitamin D (OSCAL WITH D) 500-200 MG-UNIT tablet Take 1 tablet  by mouth daily.    [provider]  Cholecalciferol (VITAMIN D) 2000 units tablet Take 2,000 Units by mouth daily.    [provider]  cyanocobalamin 500 MCG tablet Take 500 mcg by mouth daily. Vitamin B12    [provider]  divalproex (DEPAKOTE ER) 500 MG 24 hr tablet Take 2 tablets (1,000 mg total) by mouth 2 (two) times daily. For mood stabilization. Patient not taking: Reported on 02/03/2017 06/19/13   Nena Polio T, PA-C  fish oil-omega-3 fatty acids 1000 MG capsule Take 2 capsules (2 g total) by mouth daily. Cholesterol and lipid control. Patient not taking: Reported on 02/03/2017 06/19/13   Nena Polio T, PA-C  lisinopril (PRINIVIL,ZESTRIL) 40 MG tablet Take 1 tablet (40 mg total) by mouth daily. For hypertension. Patient taking differently: Take 40 mg by mouth daily. For blood pressure 06/19/13   Ruben Im, PA-C  Magnesium Oxide 420 MG TABS Take 420 mg by mouth daily. For muscle cramps    [provider]  mirtazapine (REMERON) 15 MG tablet Take 15 mg by mouth at bedtime. For tremors    [provider]  Multiple Vitamin (MULTIVITAMIN WITH MINERALS) TABS tablet Take 1 tablet by mouth daily. For nutritional supplement 06/19/13   Nena Polio T, PA-C  pravastatin (PRAVACHOL) 40 MG tablet Take 1 tablet (40 mg total) by  mouth every evening. For cholesterol and lipid control. Patient taking differently: Take 40 mg by mouth at bedtime. For cholesterol and lipid control. 06/19/13   Ruben Im, PA-C  propranolol (INDERAL) 40 MG tablet Take 1 tablet (40 mg total) by mouth daily. 07/17/19   Black, Lezlie Octave, NP  traMADol (ULTRAM) 50 MG tablet Take 1 tablet (50 mg total) by mouth every 6 (six) hours as needed for pain. Patient not taking: Reported on 02/03/2017 06/19/13   Nena Polio T, PA-C  traZODone (DESYREL) 50 MG tablet Take 1 tablet (50 mg total) by mouth at bedtime as needed and may repeat dose one time if needed for sleep. Patient taking  differently: Take 50 mg by mouth at bedtime as needed for sleep.  06/19/13   Ruben Im, PA-C    Allergies    Patient has no known allergies.  Review of Systems   Review of Systems  Constitutional: Negative for chills and fever.  Eyes: Negative for pain and visual disturbance.  Respiratory: Negative for cough and shortness of breath.   Cardiovascular: Negative for chest pain and palpitations.  Gastrointestinal: Negative for abdominal pain and vomiting.  Musculoskeletal: Positive for arthralgias, back pain and myalgias.  Skin: Negative for color change and rash.  Neurological: Negative for syncope, weakness, light-headedness and headaches.  Psychiatric/Behavioral: Negative for agitation and confusion.  All other systems reviewed and are negative.   Physical Exam Updated Vital Signs BP (!) 127/92 (BP Location: Right Arm)   Pulse (!) 102   Temp 98.6 F (37 C) (Oral)   Resp 16   SpO2 95%   Physical Exam Vitals and nursing note reviewed.  Constitutional:      Appearance: He is well-developed.  HENT:     Head: Normocephalic and atraumatic.  Eyes:     Conjunctiva/sclera: Conjunctivae normal.  Cardiovascular:     Rate and Rhythm: Normal rate and regular rhythm.     Pulses: Normal pulses.  Pulmonary:     Effort: Pulmonary effort is normal. No respiratory distress.     Breath sounds: Normal breath sounds.  Abdominal:     Palpations: Abdomen is soft.     Tenderness: There is no abdominal tenderness.  Musculoskeletal:     Cervical back: Neck supple.     Comments: Tenderness of right thoracic back off the spinal midline + L spine tenderness  Skin:    General: Skin is warm and dry.  Neurological:     General: No focal deficit present.     Mental Status: He is alert and oriented to person, place, and time.     Sensory: No sensory deficit.     Motor: No weakness.  Psychiatric:        Mood and Affect: Mood normal.        Behavior: Behavior normal.     ED Results /  Procedures / Treatments   Labs (all labs ordered are listed, but only abnormal results are displayed) Labs Reviewed - No data to display  EKG None  Radiology CT Thoracic Spine Wo Contrast  Result Date: 05/24/2020 CLINICAL DATA:  74 year old male status post fall in shower 3 days ago with continued pain. EXAM: CT THORACIC SPINE WITHOUT CONTRAST TECHNIQUE: Multidetector CT images of the thoracic were obtained using the standard protocol without intravenous contrast. COMPARISON:  None. FINDINGS: Limited cervical spine imaging: Cervicothoracic junction alignment is within normal limits. Bulky but degenerative appearing Schmorl's node of the C7 superior endplate. Thoracic spine segmentation:  Normal. Alignment:  Preserved thoracic kyphosis.  No spondylolisthesis. Vertebrae: The levels T1 through T6 appear intact. There is mild endplate irregularity at T7-T8 which appears degenerative, Schmorl's node related. There is a chronic appearing mild compression fracture of T11 (sagittal image 34). No significant retropulsion. The T12 level is intact. There is a minimally displaced fracture of the right posterior 10th rib at the costovertebral junction (series 4, image 120). Similar fracture at the right 9th rib costovertebral junction. Other visible posterior ribs appear intact. Paraspinal and other soft tissues: Negative noncontrast visible mediastinum, left upper quadrant. Bulky partially visible gallstone in the right upper abdomen (series 4, image 143). Dependent opacity in both lungs, probably atelectasis, mildly greater on the right. No pleural fluid. Mild apical paraseptal emphysema. Thoracic paraspinal soft tissues are within normal limits. Disc levels: Mild for age thoracic spine degeneration overall. Mild if any degenerative thoracic spinal stenosis. IMPRESSION: 1. Acute nondisplaced fractures of the right posterior 10th and 9th ribs at the costovertebral junctions. 2. No acute thoracic spine fracture  identified. Chronic appearing mild T11 compression fracture. 3. Mild for age thoracic spine degeneration. 4. Cholelithiasis. Electronically Signed   By: Genevie Ann M.D.   On: 05/24/2020 13:36   CT Lumbar Spine Wo Contrast  Result Date: 05/24/2020 CLINICAL DATA:  74 year old male status post fall in shower 3 days ago with continued pain. EXAM: CT LUMBAR SPINE WITHOUT CONTRAST TECHNIQUE: Multidetector CT imaging of the lumbar spine was performed without intravenous contrast administration. Multiplanar CT image reconstructions were also generated. COMPARISON:  Thoracic spine CT today reported separately. FINDINGS: Segmentation: Normal, concordant with thoracic spine numbering today. Alignment: Preserved lumbar lordosis. Vertebrae: No acute osseous abnormality identified. Intact visible sacrum and SI joints. Paraspinal and other soft tissues: Aortoiliac calcified atherosclerosis. Ectatic infrarenal abdominal aorta, up to 27 mm diameter. Bulky, lamellated gallstone redemonstrated, approximately 27 mm diameter. Negative visible other abdominal viscera. Negative lumbar paraspinal soft tissues. Disc levels: Generally mild for age lumbar spine degeneration and capacious underlying spinal canal. There is mild disc and moderate posterior element degeneration at L4-L5. There is advanced disc and endplate degeneration at L5-S1. But only mild lumbar spinal stenosis suspected at L4-L5. IMPRESSION: 1. No acute osseous abnormality in the lumbar spine. 2. Generally mild for age lumbar spine degeneration. Mild spinal stenosis suspected at L4-L5. 3. Cholelithiasis. 4. Ectatic abdominal aorta at risk for aneurysm development. Recommend followup by Ultrasound in 5 years. This recommendation follows ACR consensus guidelines: White Paper of the ACR Incidental Findings Committee II on Vascular Findings. J Am Coll Radiol 2013; 10:789-794. Aortic aneurysm NOS (ICD10-I71.9) Aortic Atherosclerosis (ICD10-I70.0). Electronically Signed   By: Genevie Ann M.D.   On: 05/24/2020 13:41    Procedures Procedures (including critical care time)  Medications Ordered in ED Medications  oxyCODONE-acetaminophen (PERCOCET/ROXICET) 5-325 MG per tablet 1 tablet (1 tablet Oral Given 05/24/20 1203)    ED Course  I have reviewed the triage vital signs and the nursing notes.  Pertinent labs & imaging results that were available during my care of the patient were reviewed by me and considered in my medical decision making (see chart for details).  74 yo male here with mechanical fall yesterday, having mid and lower back pain.  Paraspinal and midline spinal ttp on exam.  Otherwise neurologically intact, ambulatory, no evidence of spinal cord compression.  No other traumatic injuries on exam.  No reported head trauma or AC use to warrant emergent CTH.  No C-spine pain or tenderness.  CT thoracic and L spine  demonstrated 2 thoracic rib fractures, which correlate with the location of his pain.  I suspect the T-spine compression fx noted is chronic.    Percocet given in ED with some pain relief.  Advised for home motrin/tylenol and incentive spirometry for PNA prevention.   Clinical Course as of May 24 1657  Mon May 24, 2020  1356 IMPRESSION: 1. No acute osseous abnormality in the lumbar spine. 2. Generally mild for age lumbar spine degeneration. Mild spinal stenosis suspected at L4-L5. 3. Cholelithiasis. 4. Ectatic abdominal aorta at risk for aneurysm development. Recommend followup by Ultrasound in 5 years. This recommendation follows ACR consensus guidelines: White Paper of the ACR Incidental Findings Committee II on Vascular Findings. J Am Coll Radiol 2013; 10:789-794. Aortic aneurysm NOS (ICD10-I71.9) Aortic Atherosclerosis (ICD10-I70.0).   [MT]  1356 IMPRESSION: 1. Acute nondisplaced fractures of the right posterior 10th and 9th ribs at the costovertebral junctions. 2. No acute thoracic spine fracture identified. Chronic appearing mild T11  compression fracture. 3. Mild for age thoracic spine degeneration. 4. Cholelithiasis.   [MT]    Clinical Course User Index [MT] Jakorian Marengo, Carola Rhine, MD    Final Clinical Impression(s) / ED Diagnoses Final diagnoses:  Closed fracture of multiple ribs of right side, initial encounter    Rx / DC Orders ED Discharge Orders    None       Wyvonnia Dusky, MD 05/24/20 [phone]

## 2020-05-24 NOTE — Discharge Instructions (Addendum)
You fractured 2 ribs in your right side, in your back.  This can take 4-6 weeks to heal.  Remember to use your incentive spirometer at home, like we discussed, to prevent a pneumonia from forming in your lungs.    For pain you can take tylenol 650 mg every 6 hours, motrin (or aleve) every 6 hours, and your home tramadol at bedtime.

## 2020-05-24 NOTE — ED Triage Notes (Signed)
Pt fell on Friday when he slipped in the shower and now with right lower back pain that increases with standing. PT is in pain sitting.

## 2021-04-27 ENCOUNTER — Encounter: Payer: Self-pay | Admitting: *Deleted

## 2021-04-27 LAB — PULMONARY FUNCTION TEST

## 2021-05-09 ENCOUNTER — Other Ambulatory Visit (HOSPITAL_COMMUNITY): Payer: Self-pay | Admitting: Physician Assistant

## 2021-05-09 DIAGNOSIS — R911 Solitary pulmonary nodule: Secondary | ICD-10-CM

## 2021-05-27 ENCOUNTER — Encounter (HOSPITAL_COMMUNITY): Payer: Self-pay

## 2021-05-27 ENCOUNTER — Ambulatory Visit (HOSPITAL_COMMUNITY): Admission: RE | Admit: 2021-05-27 | Payer: No Typology Code available for payment source | Source: Ambulatory Visit

## 2021-06-10 ENCOUNTER — Encounter (HOSPITAL_COMMUNITY)
Admission: RE | Admit: 2021-06-10 | Discharge: 2021-06-10 | Disposition: A | Discharge status: Home / Self Care | Payer: No Typology Code available for payment source | Source: Ambulatory Visit | Attending: Physician Assistant | Admitting: Physician Assistant

## 2021-06-10 ENCOUNTER — Other Ambulatory Visit: Payer: Self-pay

## 2021-06-10 DIAGNOSIS — R911 Solitary pulmonary nodule: Secondary | ICD-10-CM | POA: Diagnosis not present

## 2021-06-10 LAB — GLUCOSE, CAPILLARY: Glucose-Capillary: 128 mg/dL — ABNORMAL HIGH (ref 70–99)

## 2021-06-10 MED ORDER — FLUDEOXYGLUCOSE F - 18 (FDG) INJECTION
10.0000 | Freq: Once | INTRAVENOUS | Status: AC | PRN
  Administered 2021-06-10: 10 via INTRAVENOUS

## 2021-06-22 ENCOUNTER — Telehealth: Payer: Self-pay | Admitting: Emergency Medicine

## 2021-06-22 NOTE — Telephone Encounter (Signed)
CT scan from 06/17/21 received and placed in Dr. Agustina Caroli review folder

## 2021-06-30 ENCOUNTER — Other Ambulatory Visit: Payer: Self-pay

## 2021-06-30 ENCOUNTER — Telehealth: Payer: Self-pay | Admitting: Pulmonary Disease

## 2021-06-30 ENCOUNTER — Encounter: Payer: Self-pay | Admitting: Pulmonary Disease

## 2021-06-30 ENCOUNTER — Ambulatory Visit (INDEPENDENT_AMBULATORY_CARE_PROVIDER_SITE_OTHER): Payer: No Typology Code available for payment source | Admitting: Pulmonary Disease

## 2021-06-30 VITALS — BP 114/76 | HR 57 | Temp 97.6°F | Ht 71.0 in | Wt 193.2 lb

## 2021-06-30 DIAGNOSIS — R599 Enlarged lymph nodes, unspecified: Secondary | ICD-10-CM | POA: Insufficient documentation

## 2021-06-30 DIAGNOSIS — R942 Abnormal results of pulmonary function studies: Secondary | ICD-10-CM

## 2021-06-30 DIAGNOSIS — R918 Other nonspecific abnormal finding of lung field: Secondary | ICD-10-CM | POA: Diagnosis not present

## 2021-06-30 DIAGNOSIS — J849 Interstitial pulmonary disease, unspecified: Secondary | ICD-10-CM | POA: Diagnosis not present

## 2021-06-30 NOTE — Progress Notes (Signed)
Synopsis: Referred in September 2022 for lung nodule by No ref. provider found  Subjective:   PATIENT ID: Austin Wells GENDER: male DOB: [birth year], MRN: [id number]  Chief Complaint  Patient presents with   Consult    Pt is being referred by the [facility] due to lung nodule.  Pt states he has been doing okay and denies any complaints of cough, SOB, or chest discomfort.     This is a 75 year old gentleman, past medical history of hypertension, syncope, tobacco abuse.  Patient was referred for evaluation of abnormal CT imaging.Patient had a nuclear medicine PET scan on 06/10/2021.  This revealed nodules in both the right and left chest suspicious for malignancy.  There is also mildly elevated uptake within the mediastinal nodes.   Past Medical History:  Diagnosis Date   Depression    Hyperlipidemia    Hypertension    Orthostatic hypotension    Syncope and collapse    Tobacco use      No family history on file.   No past surgical history on file.  Social History   Socioeconomic History   Marital status: Married    Spouse name: Not on file   Number of children: Not on file   Years of education: Not on file   Highest education level: Not on file  Occupational History   Not on file  Tobacco Use   Smoking status: Every Day   Smokeless tobacco: Never  Substance and Sexual Activity   Alcohol use: Yes   Drug use: Never   Sexual activity: Not on file  Other Topics Concern   Not on file  Social History Narrative   Not on file   Social Determinants of Health   Financial Resource Strain: Not on file  Food Insecurity: Not on file  Transportation Needs: Not on file  Physical Activity: Not on file  Stress: Not on file  Social Connections: Not on file  Intimate Partner Violence: Not on file     No Known Allergies   Outpatient Medications Prior to Visit  Medication Sig Dispense Refill   albuterol (VENTOLIN HFA) 108 (90 Base) MCG/ACT inhaler INHALE 2 PUFFS BY MOUTH EVERY 6  HOURS AS NEEDED ###     ARIPiprazole (ABILIFY) 5 MG tablet Take 5 mg by mouth daily. For mood stabilization and depression     calcium-vitamin D (OSCAL WITH D) 500-200 MG-UNIT tablet Take 1 tablet by mouth daily.     Cholecalciferol (VITAMIN D) 2000 units tablet Take 2,000 Units by mouth daily.     cyanocobalamin 500 MCG tablet Take 500 mcg by mouth daily. Vitamin B12     divalproex (DEPAKOTE ER) 500 MG 24 hr tablet Take 2 tablets (1,000 mg total) by mouth 2 (two) times daily. For mood stabilization. 120 tablet 0   lisinopril (PRINIVIL,ZESTRIL) 40 MG tablet Take 1 tablet (40 mg total) by mouth daily. For hypertension. (Patient taking differently: Take 40 mg by mouth daily. For blood pressure)     Magnesium Oxide 420 MG TABS Take 420 mg by mouth daily. For muscle cramps     mirtazapine (REMERON) 15 MG tablet Take 15 mg by mouth at bedtime. For tremors     Multiple Vitamin (MULTIVITAMIN WITH MINERALS) TABS tablet Take 1 tablet by mouth daily. For nutritional supplement     pravastatin (PRAVACHOL) 40 MG tablet Take 1 tablet (40 mg total) by mouth every evening. For cholesterol and lipid control. (Patient taking differently: Take 40 mg by mouth at bedtime.  For cholesterol and lipid control.) 30 tablet    propranolol (INDERAL) 40 MG tablet Take 1 tablet (40 mg total) by mouth daily. 30 tablet 1   traMADol (ULTRAM) 50 MG tablet Take 1 tablet (50 mg total) by mouth every 6 (six) hours as needed for pain. 30 tablet    traZODone (DESYREL) 50 MG tablet Take 1 tablet (50 mg total) by mouth at bedtime as needed and may repeat dose one time if needed for sleep. (Patient taking differently: Take 50 mg by mouth at bedtime as needed for sleep.) 30 tablet 0   fish oil-omega-3 fatty acids 1000 MG capsule Take 2 capsules (2 g total) by mouth daily. Cholesterol and lipid control.     No facility-administered medications prior to visit.    Review of Systems  Constitutional:  Negative for chills, fever,  malaise/fatigue and weight loss.  HENT:  Negative for hearing loss, sore throat and tinnitus.   Eyes:  Negative for blurred vision and double vision.  Respiratory:  Negative for cough, hemoptysis, sputum production, shortness of breath, wheezing and stridor.   Cardiovascular:  Negative for chest pain, palpitations, orthopnea, leg swelling and PND.  Gastrointestinal:  Negative for abdominal pain, constipation, diarrhea, heartburn, nausea and vomiting.  Genitourinary:  Negative for dysuria, hematuria and urgency.  Musculoskeletal:  Negative for joint pain and myalgias.  Skin:  Negative for itching and rash.  Neurological:  Negative for dizziness, tingling, weakness and headaches.  Endo/Heme/Allergies:  Negative for environmental allergies. Does not bruise/bleed easily.  Psychiatric/Behavioral:  Negative for depression. The patient is not nervous/anxious and does not have insomnia.   All other systems reviewed and are negative.   Objective:  Physical Exam   Vitals:   06/30/21 1435  BP: 114/76  Pulse: (!) 57  Temp: 97.6 F (36.4 C)  TempSrc: Oral  SpO2: 99%  Weight: 193 lb 3.2 oz (87.6 kg)  Height: 5\' 11"  (1.803 m)   99% on RA BMI Readings from Last 3 Encounters:  06/30/21 26.95 kg/m  07/15/19 27.89 kg/m   Wt Readings from Last 3 Encounters:  06/30/21 193 lb 3.2 oz (87.6 kg)  07/15/19 200 lb (90.7 kg)     CBC    Component Value Date/Time   WBC 7.8 07/16/2019 0405   RBC 4.09 (L) 07/16/2019 0405   HGB 13.5 07/16/2019 0405   HCT 38.4 (L) 07/16/2019 0405   PLT 97 (L) 07/16/2019 0405   MCV 93.9 07/16/2019 0405   MCH 33.0 07/16/2019 0405   MCHC 35.2 07/16/2019 0405   RDW 11.6 07/16/2019 0405   LYMPHSABS 1.8 07/15/2019 2308   MONOABS 0.9 07/15/2019 2308   EOSABS 0.1 07/15/2019 2308   BASOSABS 0.0 07/15/2019 2308     Chest Imaging: 06/11/2021 nuclear medicine PET scan: Bilateral right and left nodules, increased hypermetabolic activity concerning for malignancy.   There are also associated nodal involvement within the mediastinum. The patient's images have been independently reviewed by me.    Pulmonary Functions Testing Results: No flowsheet data found.  FeNO:   Pathology:   Echocardiogram:   Heart Catheterization:     Assessment & Plan:     ICD-10-CM   1. Multiple lung nodules  R91.8     2. Adenopathy  R59.9     3. ILD (interstitial lung disease) ([place])  J84.9     4. Abnormal PET scan of lung  R94.2       Discussion:  This is a 78 year old gentleman, PET scan recently completed with multiple  bilateral pulmonary nodules with other small associated hypermetabolic lymph nodes.  Patient is being referred for evaluation of these nodules and consideration for tissue biopsy.  Plan: Today in in the office we discussed the risk benefits and alternatives of proceeding with bronchoscopy. He has bilateral nodules with mediastinal adenopathy. I think we should consider bronchoscopy for tissue sampling. We discussed the risk benefits and alternatives of considerations for video bronchoscopy with endobronchial ultrasound as well as navigational bronchoscopy. He has a unique pattern on his CT scan as he has small nodules yet he also has hypermetabolic adenopathy.  His CT scan also has interstitial lung disease.  Some unsure if were dealing with malignancy in the distal metabolic nodules which is likely versus associated metastatic disease or inflammatory process within the mediastinal nodes. Super D CT has been ordered Tentative bronchoscopy orders have been placed for 07/19/2021.     Current Outpatient Medications:    albuterol (VENTOLIN HFA) 108 (90 Base) MCG/ACT inhaler, INHALE 2 PUFFS BY MOUTH EVERY 6 HOURS AS NEEDED ###, Disp: , Rfl:    ARIPiprazole (ABILIFY) 5 MG tablet, Take 5 mg by mouth daily. For mood stabilization and depression, Disp: , Rfl:    calcium-vitamin D (OSCAL WITH D) 500-200 MG-UNIT tablet, Take 1 tablet by mouth daily.,  Disp: , Rfl:    Cholecalciferol (VITAMIN D) 2000 units tablet, Take 2,000 Units by mouth daily., Disp: , Rfl:    cyanocobalamin 500 MCG tablet, Take 500 mcg by mouth daily. Vitamin B12, Disp: , Rfl:    divalproex (DEPAKOTE ER) 500 MG 24 hr tablet, Take 2 tablets (1,000 mg total) by mouth 2 (two) times daily. For mood stabilization., Disp: 120 tablet, Rfl: 0   lisinopril (PRINIVIL,ZESTRIL) 40 MG tablet, Take 1 tablet (40 mg total) by mouth daily. For hypertension. (Patient taking differently: Take 40 mg by mouth daily. For blood pressure), Disp: , Rfl:    Magnesium Oxide 420 MG TABS, Take 420 mg by mouth daily. For muscle cramps, Disp: , Rfl:    mirtazapine (REMERON) 15 MG tablet, Take 15 mg by mouth at bedtime. For tremors, Disp: , Rfl:    Multiple Vitamin (MULTIVITAMIN WITH MINERALS) TABS tablet, Take 1 tablet by mouth daily. For nutritional supplement, Disp: , Rfl:    pravastatin (PRAVACHOL) 40 MG tablet, Take 1 tablet (40 mg total) by mouth every evening. For cholesterol and lipid control. (Patient taking differently: Take 40 mg by mouth at bedtime. For cholesterol and lipid control.), Disp: 30 tablet, Rfl:    propranolol (INDERAL) 40 MG tablet, Take 1 tablet (40 mg total) by mouth daily., Disp: 30 tablet, Rfl: 1   traMADol (ULTRAM) 50 MG tablet, Take 1 tablet (50 mg total) by mouth every 6 (six) hours as needed for pain., Disp: 30 tablet, Rfl:    traZODone (DESYREL) 50 MG tablet, Take 1 tablet (50 mg total) by mouth at bedtime as needed and may repeat dose one time if needed for sleep. (Patient taking differently: Take 50 mg by mouth at bedtime as needed for sleep.), Disp: 30 tablet, Rfl: 0  I spent 63 minutes dedicated to the care of this patient on the date of this encounter to include pre-visit review of records, face-to-face time with the patient discussing conditions above, post visit ordering of testing, clinical documentation with the electronic health record, making appropriate referrals as  documented, and communicating necessary findings to members of the patients care team.   Garner Nash, DO [facility] 06/30/2021 2:39 PM

## 2021-06-30 NOTE — Patient Instructions (Signed)
Thank you for visiting Dr. Valeta Harms at [facility]. Today we recommend the following: Orders Placed This Encounter  Procedures   Procedural/ Surgical Case Request: VIDEO BRONCHOSCOPY WITH ENDOBRONCHIAL NAVIGATION, VIDEO BRONCHOSCOPY WITH ENDOBRONCHIAL ULTRASOUND   CT Super D Chest Wo Contrast   Ambulatory referral to Pulmonology   Bronchoscopy on 07/19/2021  Return in about 6 weeks (around 08/11/2021) for with APP or Dr. Valeta Harms.    Please do your part to reduce the spread of COVID-19.

## 2021-06-30 NOTE — Telephone Encounter (Signed)
I scheduled pt at [facility] for 9/27 at 1:00.  He will go for CT on 9/23 at [facility] and will get his covid test that day.  I gave appt info to pt's wife.

## 2021-07-08 NOTE — Telephone Encounter (Signed)
CT was not on authorization from [place].  I have been working on getting auth for this with his nurse navigator Remus Blake.  I spoke to her today and she states CT [place] is still pending and waiting to be reviewed by doc.  She told me to check back with her next Tues or Wed for status.  I explained to her if not authorized we will need to reschedule pt's procedure for the 27th.

## 2021-07-12 NOTE — Telephone Encounter (Signed)
Spoke to [place] at [place] today and she states that the CT will be covered under the referral for Pulmonary.

## 2021-07-15 ENCOUNTER — Encounter (HOSPITAL_COMMUNITY): Payer: Self-pay | Admitting: Pulmonary Disease

## 2021-07-15 ENCOUNTER — Other Ambulatory Visit: Payer: Self-pay | Admitting: Pulmonary Disease

## 2021-07-15 ENCOUNTER — Ambulatory Visit (INDEPENDENT_AMBULATORY_CARE_PROVIDER_SITE_OTHER)
Admission: RE | Admit: 2021-07-15 | Discharge: 2021-07-15 | Disposition: A | Discharge status: Home / Self Care | Payer: No Typology Code available for payment source | Source: Ambulatory Visit | Attending: Pulmonary Disease | Admitting: Pulmonary Disease

## 2021-07-15 ENCOUNTER — Other Ambulatory Visit: Payer: Self-pay

## 2021-07-15 DIAGNOSIS — R918 Other nonspecific abnormal finding of lung field: Secondary | ICD-10-CM | POA: Diagnosis not present

## 2021-07-15 DIAGNOSIS — R599 Enlarged lymph nodes, unspecified: Secondary | ICD-10-CM | POA: Diagnosis not present

## 2021-07-15 NOTE — Progress Notes (Signed)
Spoke with pt's wife, Quita Skye for pre-op call. DPR on file and pt requested last evening when I called to call back today to talk to his wife. Quita Skye states pt does not have a cardiac history or Diabetes. He is treated for HTN. Quita Skye also states pt uses crack cocaine quite often, this was not listed in his medical history. I did tell Quita Skye to have pt not to use at least 3 days prior to the procedure.   Covid test to be done today.

## 2021-07-16 LAB — SARS CORONAVIRUS 2 (TAT 6-24 HRS): SARS Coronavirus 2: POSITIVE — AB

## 2021-07-19 ENCOUNTER — Ambulatory Visit (HOSPITAL_COMMUNITY)
Admission: RE | Admit: 2021-07-19 | Discharge: 2021-07-19 | Disposition: A | Discharge status: Home / Self Care | Payer: No Typology Code available for payment source | Attending: Pulmonary Disease | Admitting: Pulmonary Disease

## 2021-07-19 DIAGNOSIS — R918 Other nonspecific abnormal finding of lung field: Secondary | ICD-10-CM | POA: Insufficient documentation

## 2021-07-19 DIAGNOSIS — R599 Enlarged lymph nodes, unspecified: Secondary | ICD-10-CM | POA: Insufficient documentation

## 2021-07-19 HISTORY — DX: Bipolar disorder, unspecified: F31.9

## 2021-07-19 HISTORY — DX: Sleep apnea, unspecified: G47.30

## 2021-07-19 HISTORY — DX: Gastro-esophageal reflux disease without esophagitis: K21.9

## 2021-07-19 SURGERY — VIDEO BRONCHOSCOPY WITH ENDOBRONCHIAL NAVIGATION
Anesthesia: General | Laterality: Left

## 2021-07-19 NOTE — Progress Notes (Signed)
Patient arrived for procedure today that had been cancelled due to patient testing positive for covid on 07/15/21.  Patient states that he was never made aware that he was covid positive.  Patient placed in a short stay bay and given apple juice, peanut butter, and crackers until his ride could arrive.   Patient escorted to main entrance by nurse.

## 2021-08-01 ENCOUNTER — Telehealth: Payer: Self-pay | Admitting: Pulmonary Disease

## 2021-08-01 NOTE — Progress Notes (Signed)
I called the listed phone number for Mr Clinger, it was silent , then a busy sound. I called Dr. Juline Patch office and asked if they have any other numbers for the patient, that we called over the weekend and this am. I was informed that they have the same number as patient's home number, cell number and wife's number. I asked for one of the nurse's names so that I can send a staff message. I sent a staff message to Tilden Dome.

## 2021-08-02 ENCOUNTER — Ambulatory Visit (HOSPITAL_COMMUNITY)
Admission: RE | Admit: 2021-08-02 | Payer: No Typology Code available for payment source | Source: Home / Self Care | Admitting: Pulmonary Disease

## 2021-08-02 SURGERY — VIDEO BRONCHOSCOPY WITH ENDOBRONCHIAL NAVIGATION
Anesthesia: General | Laterality: Left

## 2021-08-02 NOTE — Progress Notes (Signed)
PCCM:  The patient has no showed for his bronchoscopy. If he shows up sometime today we will attempt to get his case worked in. Otherwise he will need to be rescheduled in clinic to meet and discuss an alternate date for procedure.   Garner Nash, DO [facility] 08/02/2021 9:41 AM

## 2021-08-05 NOTE — Telephone Encounter (Signed)
Per Dr Valeta Harms:   Austin Nash, DO to [name]  [facility] Procedure     7:32 PM He was a no show for procedure this week  If he calls back he needs an appt to see me in person before I will consider rescheduling   Thanks   BLI    Spoke with the pt's spouse  She states bronch cancelled due to pt testing pos for covid  I scheduled ov with BI 08/19/21 at 3 pm to consider rescheduling

## 2021-08-09 ENCOUNTER — Telehealth: Payer: Self-pay | Admitting: Pulmonary Disease

## 2021-08-09 NOTE — Telephone Encounter (Signed)
Spoke with the pt's spouse, Austin Wells ok per DPR  Pt asking to expedite his bronch being rescheduled per advice from Dr Joaquim Lai at the [facility]  Dr Valeta Harms had stated before that pt needed appt before he would consider rescheduling  He had tested pos for covid and this is why bronch was not done to begin with  He is currently scheduled for ov with BI on 08/19/21  Please advise, thanks!

## 2021-08-10 NOTE — Telephone Encounter (Signed)
Entered in error

## 2021-08-10 NOTE — Telephone Encounter (Signed)
Called and spoke with pt's spouse Quita Skye letting her know the info stated by Dr. Valeta Harms and she verbalized understanding. Nothing further needed.

## 2021-08-19 ENCOUNTER — Other Ambulatory Visit: Payer: Self-pay

## 2021-08-19 ENCOUNTER — Encounter: Payer: Self-pay | Admitting: Pulmonary Disease

## 2021-08-19 ENCOUNTER — Ambulatory Visit (INDEPENDENT_AMBULATORY_CARE_PROVIDER_SITE_OTHER): Payer: No Typology Code available for payment source | Admitting: Pulmonary Disease

## 2021-08-19 VITALS — BP 122/70 | HR 64 | Temp 97.6°F | Ht 71.0 in | Wt 183.0 lb

## 2021-08-19 DIAGNOSIS — Z23 Encounter for immunization: Secondary | ICD-10-CM | POA: Diagnosis not present

## 2021-08-19 DIAGNOSIS — R918 Other nonspecific abnormal finding of lung field: Secondary | ICD-10-CM | POA: Diagnosis not present

## 2021-08-19 DIAGNOSIS — R942 Abnormal results of pulmonary function studies: Secondary | ICD-10-CM

## 2021-08-19 DIAGNOSIS — R599 Enlarged lymph nodes, unspecified: Secondary | ICD-10-CM | POA: Diagnosis not present

## 2021-08-19 NOTE — Patient Instructions (Signed)
Thank you for visiting Dr. Valeta Harms at [facility]. Today we recommend the following:  Orders Placed This Encounter  Procedures   Procedural/ Surgical Case Request: VIDEO BRONCHOSCOPY WITH ENDOBRONCHIAL NAVIGATION, VIDEO BRONCHOSCOPY WITH ENDOBRONCHIAL ULTRASOUND   Flu Vaccine QUAD High Dose(Fluad)   Ambulatory referral to Pulmonology   Bronchoscopy scheduled on 08/30/2021  Return in about 17 days (around 09/05/2021) for Eric Form, NP after bronchoscopy appt to go over results .    Please do your part to reduce the spread of COVID-19.

## 2021-08-19 NOTE — H&P (View-Only) (Signed)
Synopsis: Referred in September 2022 for lung nodule by Clinic, [facility]  Subjective:   PATIENT ID: Austin Wells GENDER: male DOB: 10-13-1946, MRN: [id number]  Chief Complaint  Patient presents with   Follow-up    Pt is here today to discuss rescheduling bronch.  Pt states he has been doing okay since last visit and denies any complaints.     This is a 75 year old gentleman, past medical history of hypertension, syncope, tobacco abuse.  Patient was referred for evaluation of abnormal CT imaging.Patient had a nuclear medicine PET scan on 06/10/2021.  This revealed nodules in both the right and left chest suspicious for malignancy.  There is also mildly elevated uptake within the mediastinal nodes.  OV 08/19/2021: Here today for follow-up after having 2 prior bronchoscopies scheduled.  Unfortunately at his first bronchoscopy tested positive for COVID-19 and had to be rescheduled.  His bronchoscopy was rescheduled and then he did not show up for the day of his bronchoscopy.  Therefore patient was seen today in the office to discuss the importance of rescheduling this and clarifying a day in which she could ensure that he would be present.  We also made sure we had telephone numbers that were appropriate for reaching his wife.  [name]  Diagnosis Date   Bipolar disorder ([place])    Depression    GERD (gastroesophageal reflux disease)    Hyperlipidemia    Hypertension    Orthostatic hypotension    Sleep apnea    has a cpap and doesn't use it   Syncope and collapse    Tobacco use      No family history on file.   Past Surgical History:  Procedure Laterality Date   COLONOSCOPY     HERNIA REPAIR     inquinal    Social History   Socioeconomic History   Marital status: Married    Spouse name: Not on file    Number of children: Not on file   Years of education: Not on file   Highest education level: Not on file  Occupational History   Not on file  Tobacco Use   Smoking status: Every Day    Packs/day: 2.00    Years: 55.00    Pack years: 110.00    Types: Cigarettes    Start date: 17   Smokeless tobacco: Never   Tobacco comments:    Currently smoking 1.5ppd as of 10/28/22ep  Substance and Sexual Activity   Alcohol use: Yes    Comment: very rare   Drug use: Yes    Types: "Crack" cocaine   Sexual activity: Not on file  Other Topics Concern   Not on file  Social History Narrative   Not on file   Social Determinants of Health   Financial Resource Strain: Not on file  Food Insecurity: Not on file  Transportation Needs: Not on file  Physical Activity: Not on file  Stress: Not on file  Social Connections: Not on file  Intimate Partner Violence: Not on file     No Known Allergies   Outpatient Medications Prior to Visit  Medication Sig Dispense Refill   ARIPiprazole (ABILIFY) 5 MG tablet Take 5 mg by mouth daily. For mood stabilization and depression     calcium-vitamin D (OSCAL WITH D) 500-200 MG-UNIT tablet Take 1  tablet by mouth daily.     cyanocobalamin 500 MCG tablet Take 500 mcg by mouth daily. Vitamin B12     hydrochlorothiazide (HYDRODIURIL) 25 MG tablet Take 12.5 mg by mouth daily.     lisinopril (PRINIVIL,ZESTRIL) 40 MG tablet Take 1 tablet (40 mg total) by mouth daily. For hypertension. (Patient taking differently: Take 40 mg by mouth daily. For blood pressure)     MELATONIN PO Take 1 tablet by mouth at bedtime.     propranolol (INDERAL) 10 MG tablet Take 10 mg by mouth 2 (two) times daily.     rosuvastatin (CRESTOR) 40 MG tablet Take 40 mg by mouth daily.     traZODone (DESYREL) 150 MG tablet Take 150 mg by mouth at bedtime as needed for sleep.     venlafaxine XR (EFFEXOR-XR) 75 MG 24 hr capsule Take 75 mg by mouth daily with breakfast.     No facility-administered  medications prior to visit.    Review of Systems  Constitutional:  Negative for chills, fever, malaise/fatigue and weight loss.  HENT:  Negative for hearing loss, sore throat and tinnitus.   Eyes:  Negative for blurred vision and double vision.  Respiratory:  Positive for cough and shortness of breath. Negative for hemoptysis, sputum production, wheezing and stridor.   Cardiovascular:  Negative for chest pain, palpitations, orthopnea, leg swelling and PND.  Gastrointestinal:  Negative for abdominal pain, constipation, diarrhea, heartburn, nausea and vomiting.  Genitourinary:  Negative for dysuria, hematuria and urgency.  Musculoskeletal:  Negative for joint pain and myalgias.  Skin:  Negative for itching and rash.  Neurological:  Negative for dizziness, tingling, weakness and headaches.  Endo/Heme/Allergies:  Negative for environmental allergies. Does not bruise/bleed easily.  Psychiatric/Behavioral:  Negative for depression. The patient is not nervous/anxious and does not have insomnia.   All other systems reviewed and are negative.   Objective:  Physical Exam Vitals reviewed.  Constitutional:      General: He is not in acute distress.    Appearance: He is well-developed.  HENT:     Head: Normocephalic and atraumatic.  Eyes:     General: No scleral icterus.    Conjunctiva/sclera: Conjunctivae normal.     Pupils: Pupils are equal, round, and reactive to light.  Neck:     Vascular: No JVD.     Trachea: No tracheal deviation.  Cardiovascular:     Rate and Rhythm: Normal rate and regular rhythm.     Heart sounds: Normal heart sounds. No murmur heard. Pulmonary:     Effort: Pulmonary effort is normal. No tachypnea, accessory muscle usage or respiratory distress.     Breath sounds: No stridor. No wheezing, rhonchi or rales.     Comments: Bilateral inspiratory basilar crackles. Abdominal:     General: Bowel sounds are normal. There is no distension.     Palpations: Abdomen is  soft.     Tenderness: There is no abdominal tenderness.  Musculoskeletal:        General: No tenderness.     Cervical back: Neck supple.  Lymphadenopathy:     Cervical: No cervical adenopathy.  Skin:    General: Skin is warm and dry.     Capillary Refill: Capillary refill takes less than 2 seconds.     Findings: No rash.  Neurological:     Mental Status: He is alert and oriented to person, place, and time.  Psychiatric:        Behavior: Behavior normal.     Vitals:  08/19/21 1455  BP: 122/70  Pulse: 64  Temp: 97.6 F (36.4 C)  TempSrc: Oral  SpO2: 99%  Weight: 183 lb (83 kg)  Height: 5\' 11"  (1.803 m)   99% on RA BMI Readings from Last 3 Encounters:  08/19/21 25.52 kg/m  06/30/21 26.95 kg/m  07/15/19 27.89 kg/m   Wt Readings from Last 3 Encounters:  08/19/21 183 lb (83 kg)  06/30/21 193 lb 3.2 oz (87.6 kg)  07/15/19 200 lb (90.7 kg)     CBC    Component Value Date/Time   WBC 7.8 07/16/2019 0405   RBC 4.09 (L) 07/16/2019 0405   HGB 13.5 07/16/2019 0405   HCT 38.4 (L) 07/16/2019 0405   PLT 97 (L) 07/16/2019 0405   MCV 93.9 07/16/2019 0405   MCH 33.0 07/16/2019 0405   MCHC 35.2 07/16/2019 0405   RDW 11.6 07/16/2019 0405   LYMPHSABS 1.8 07/15/2019 2308   MONOABS 0.9 07/15/2019 2308   EOSABS 0.1 07/15/2019 2308   BASOSABS 0.0 07/15/2019 2308     Chest Imaging: 06/11/2021 nuclear medicine PET scan: Bilateral right and left nodules, increased hypermetabolic activity concerning for malignancy.  There are also associated nodal involvement within the mediastinum. The patient's images have been independently reviewed by me.    Pulmonary Functions Testing Results: No flowsheet data found.  FeNO:   Pathology:   Echocardiogram:   Heart Catheterization:     Assessment & Plan:     ICD-10-CM   1. Multiple lung nodules  R91.8 Ambulatory referral to Pulmonology    Procedural/ Surgical Case Request: VIDEO BRONCHOSCOPY WITH ENDOBRONCHIAL NAVIGATION, VIDEO  BRONCHOSCOPY WITH ENDOBRONCHIAL ULTRASOUND    2. Need for immunization against influenza  Z23 Flu Vaccine QUAD High Dose(Fluad)    3. Adenopathy  R59.9 Ambulatory referral to Pulmonology    Procedural/ Surgical Case Request: VIDEO BRONCHOSCOPY WITH ENDOBRONCHIAL NAVIGATION, VIDEO BRONCHOSCOPY WITH ENDOBRONCHIAL ULTRASOUND    4. Abnormal PET scan of lung  R94.2       Discussion:  This is a 24 year old gentleman, PET scan recently completed with multiple pulmonary nodules and a small associated hypermetabolic adenopathy within the left hilum.  Concern for malignancy.  We discussed this today in the office.  We also discussed the pertinence of if we do schedule a repeat bronchoscopy that he was going to show up for this procedure.  Plan: We discussed the risk benefits and alternatives again of proceeding with right robotic assisted bronchoscopy and tissue sampling. Patient understands a he has bilateral pulmonary nodules and a sampling of both as well as the mediastinal node that is enlarged and abnormal in the PET scan. Patient is agreeable to this plan. We will plan for bronchoscopy again on 08/30/2021. We will also call his wife to confirm. His wife is not present today in the office but we do have her phone number. Will need to make sure that this is a reliable date in which he will be able to be present and show up for the procedure. We also counseled him on smoking cessation and illicit drug use.    Current Outpatient Medications:    ARIPiprazole (ABILIFY) 5 MG tablet, Take 5 mg by mouth daily. For mood stabilization and depression, Disp: , Rfl:    calcium-vitamin D (OSCAL WITH D) 500-200 MG-UNIT tablet, Take 1 tablet by mouth daily., Disp: , Rfl:    cyanocobalamin 500 MCG tablet, Take 500 mcg by mouth daily. Vitamin B12, Disp: , Rfl:    hydrochlorothiazide (HYDRODIURIL) 25 MG tablet,  Take 12.5 mg by mouth daily., Disp: , Rfl:    lisinopril (PRINIVIL,ZESTRIL) 40 MG tablet, Take 1  tablet (40 mg total) by mouth daily. For hypertension. (Patient taking differently: Take 40 mg by mouth daily. For blood pressure), Disp: , Rfl:    MELATONIN PO, Take 1 tablet by mouth at bedtime., Disp: , Rfl:    propranolol (INDERAL) 10 MG tablet, Take 10 mg by mouth 2 (two) times daily., Disp: , Rfl:    rosuvastatin (CRESTOR) 40 MG tablet, Take 40 mg by mouth daily., Disp: , Rfl:    traZODone (DESYREL) 150 MG tablet, Take 150 mg by mouth at bedtime as needed for sleep., Disp: , Rfl:    venlafaxine XR (EFFEXOR-XR) 75 MG 24 hr capsule, Take 75 mg by mouth daily with breakfast., Disp: , Rfl:     Garner Nash, DO [facility] 08/19/2021 3:18 PM

## 2021-08-19 NOTE — Progress Notes (Signed)
Synopsis: Referred in September 2022 for lung nodule by [facility], [facility]  Subjective:   PATIENT ID: Austin Wells GENDER: male DOB: 12-Nov-1945, MRN: [id number]  Chief Complaint  Patient presents with   Follow-up    Pt is here today to discuss rescheduling bronch.  Pt states he has been doing okay since last visit and denies any complaints.     This is a 75 year old gentleman, past medical history of hypertension, syncope, tobacco abuse.  Patient was referred for evaluation of abnormal CT imaging.Patient had a nuclear medicine PET scan on 06/10/2021.  This revealed nodules in both the right and left chest suspicious for malignancy.  There is also mildly elevated uptake within the mediastinal nodes.  OV 08/19/2021: Here today for follow-up after having 2 prior bronchoscopies scheduled.  Unfortunately at his first bronchoscopy tested positive for COVID-19 and had to be rescheduled.  His bronchoscopy was rescheduled and then he did not show up for the day of his bronchoscopy.  Therefore patient was seen today in the office to discuss the importance of rescheduling this and clarifying a day in which she could ensure that he would be present.  We also made sure we had telephone numbers that were appropriate for reaching his wife.  [name]  Diagnosis Date   Bipolar disorder ([place])    Depression    GERD (gastroesophageal reflux disease)    Hyperlipidemia    Hypertension    Orthostatic hypotension    Sleep apnea    has a cpap and doesn't use it   Syncope and collapse    Tobacco use      No family history on file.   Past Surgical History:  Procedure Laterality Date   COLONOSCOPY     HERNIA REPAIR     inquinal    Social History   Socioeconomic History   Marital status: Married    Spouse name: Not on file    Number of children: Not on file   Years of education: Not on file   Highest education level: Not on file  Occupational History   Not on file  Tobacco Use   Smoking status: Every Day    Packs/day: 2.00    Years: 55.00    Pack years: 110.00    Types: Cigarettes    Start date: 62   Smokeless tobacco: Never   Tobacco comments:    Currently smoking 1.5ppd as of 10/28/22ep  Substance and Sexual Activity   Alcohol use: Yes    Comment: very rare   Drug use: Yes    Types: "Crack" cocaine   Sexual activity: Not on file  Other Topics Concern   Not on file  Social History Narrative   Not on file   Social Determinants of Health   Financial Resource Strain: Not on file  Food Insecurity: Not on file  Transportation Needs: Not on file  Physical Activity: Not on file  Stress: Not on file  Social Connections: Not on file  Intimate Partner Violence: Not on file     No Known Allergies   Outpatient Medications Prior to Visit  Medication Sig Dispense Refill   ARIPiprazole (ABILIFY) 5 MG tablet Take 5 mg by mouth daily. For mood stabilization and depression     calcium-vitamin D (OSCAL WITH D) 500-200 MG-UNIT tablet Take 1  tablet by mouth daily.     cyanocobalamin 500 MCG tablet Take 500 mcg by mouth daily. Vitamin B12     hydrochlorothiazide (HYDRODIURIL) 25 MG tablet Take 12.5 mg by mouth daily.     lisinopril (PRINIVIL,ZESTRIL) 40 MG tablet Take 1 tablet (40 mg total) by mouth daily. For hypertension. (Patient taking differently: Take 40 mg by mouth daily. For blood pressure)     MELATONIN PO Take 1 tablet by mouth at bedtime.     propranolol (INDERAL) 10 MG tablet Take 10 mg by mouth 2 (two) times daily.     rosuvastatin (CRESTOR) 40 MG tablet Take 40 mg by mouth daily.     traZODone (DESYREL) 150 MG tablet Take 150 mg by mouth at bedtime as needed for sleep.     venlafaxine XR (EFFEXOR-XR) 75 MG 24 hr capsule Take 75 mg by mouth daily with breakfast.     No facility-administered  medications prior to visit.    Review of Systems  Constitutional:  Negative for chills, fever, malaise/fatigue and weight loss.  HENT:  Negative for hearing loss, sore throat and tinnitus.   Eyes:  Negative for blurred vision and double vision.  Respiratory:  Positive for cough and shortness of breath. Negative for hemoptysis, sputum production, wheezing and stridor.   Cardiovascular:  Negative for chest pain, palpitations, orthopnea, leg swelling and PND.  Gastrointestinal:  Negative for abdominal pain, constipation, diarrhea, heartburn, nausea and vomiting.  Genitourinary:  Negative for dysuria, hematuria and urgency.  Musculoskeletal:  Negative for joint pain and myalgias.  Skin:  Negative for itching and rash.  Neurological:  Negative for dizziness, tingling, weakness and headaches.  Endo/Heme/Allergies:  Negative for environmental allergies. Does not bruise/bleed easily.  Psychiatric/Behavioral:  Negative for depression. The patient is not nervous/anxious and does not have insomnia.   All other systems reviewed and are negative.   Objective:  Physical Exam Vitals reviewed.  Constitutional:      General: He is not in acute distress.    Appearance: He is well-developed.  HENT:     Head: Normocephalic and atraumatic.  Eyes:     General: No scleral icterus.    Conjunctiva/sclera: Conjunctivae normal.     Pupils: Pupils are equal, round, and reactive to light.  Neck:     Vascular: No JVD.     Trachea: No tracheal deviation.  Cardiovascular:     Rate and Rhythm: Normal rate and regular rhythm.     Heart sounds: Normal heart sounds. No murmur heard. Pulmonary:     Effort: Pulmonary effort is normal. No tachypnea, accessory muscle usage or respiratory distress.     Breath sounds: No stridor. No wheezing, rhonchi or rales.     Comments: Bilateral inspiratory basilar crackles. Abdominal:     General: Bowel sounds are normal. There is no distension.     Palpations: Abdomen is  soft.     Tenderness: There is no abdominal tenderness.  Musculoskeletal:        General: No tenderness.     Cervical back: Neck supple.  Lymphadenopathy:     Cervical: No cervical adenopathy.  Skin:    General: Skin is warm and dry.     Capillary Refill: Capillary refill takes less than 2 seconds.     Findings: No rash.  Neurological:     Mental Status: He is alert and oriented to person, place, and time.  Psychiatric:        Behavior: Behavior normal.     Vitals:  08/19/21 1455  BP: 122/70  Pulse: 64  Temp: 97.6 F (36.4 C)  TempSrc: Oral  SpO2: 99%  Weight: 183 lb (83 kg)  Height: 5\' 11"  (1.803 m)   99% on RA BMI Readings from Last 3 Encounters:  08/19/21 25.52 kg/m  06/30/21 26.95 kg/m  07/15/19 27.89 kg/m   Wt Readings from Last 3 Encounters:  08/19/21 183 lb (83 kg)  06/30/21 193 lb 3.2 oz (87.6 kg)  07/15/19 200 lb (90.7 kg)     CBC    Component Value Date/Time   WBC 7.8 07/16/2019 0405   RBC 4.09 (L) 07/16/2019 0405   HGB 13.5 07/16/2019 0405   HCT 38.4 (L) 07/16/2019 0405   PLT 97 (L) 07/16/2019 0405   MCV 93.9 07/16/2019 0405   MCH 33.0 07/16/2019 0405   MCHC 35.2 07/16/2019 0405   RDW 11.6 07/16/2019 0405   LYMPHSABS 1.8 07/15/2019 2308   MONOABS 0.9 07/15/2019 2308   EOSABS 0.1 07/15/2019 2308   BASOSABS 0.0 07/15/2019 2308     Chest Imaging: 06/11/2021 nuclear medicine PET scan: Bilateral right and left nodules, increased hypermetabolic activity concerning for malignancy.  There are also associated nodal involvement within the mediastinum. The patient's images have been independently reviewed by me.    Pulmonary Functions Testing Results: No flowsheet data found.  FeNO:   Pathology:   Echocardiogram:   Heart Catheterization:     Assessment & Plan:     ICD-10-CM   1. Multiple lung nodules  R91.8 Ambulatory referral to Pulmonology    Procedural/ Surgical Case Request: VIDEO BRONCHOSCOPY WITH ENDOBRONCHIAL NAVIGATION, VIDEO  BRONCHOSCOPY WITH ENDOBRONCHIAL ULTRASOUND    2. Need for immunization against influenza  Z23 Flu Vaccine QUAD High Dose(Fluad)    3. Adenopathy  R59.9 Ambulatory referral to Pulmonology    Procedural/ Surgical Case Request: VIDEO BRONCHOSCOPY WITH ENDOBRONCHIAL NAVIGATION, VIDEO BRONCHOSCOPY WITH ENDOBRONCHIAL ULTRASOUND    4. Abnormal PET scan of lung  R94.2       Discussion:  This is a 12 year old gentleman, PET scan recently completed with multiple pulmonary nodules and a small associated hypermetabolic adenopathy within the left hilum.  Concern for malignancy.  We discussed this today in the office.  We also discussed the pertinence of if we do schedule a repeat bronchoscopy that he was going to show up for this procedure.  Plan: We discussed the risk benefits and alternatives again of proceeding with right robotic assisted bronchoscopy and tissue sampling. Patient understands a he has bilateral pulmonary nodules and a sampling of both as well as the mediastinal node that is enlarged and abnormal in the PET scan. Patient is agreeable to this plan. We will plan for bronchoscopy again on 08/30/2021. We will also call his wife to confirm. His wife is not present today in the office but we do have her phone number. Will need to make sure that this is a reliable date in which he will be able to be present and show up for the procedure. We also counseled him on smoking cessation and illicit drug use.    Current Outpatient Medications:    ARIPiprazole (ABILIFY) 5 MG tablet, Take 5 mg by mouth daily. For mood stabilization and depression, Disp: , Rfl:    calcium-vitamin D (OSCAL WITH D) 500-200 MG-UNIT tablet, Take 1 tablet by mouth daily., Disp: , Rfl:    cyanocobalamin 500 MCG tablet, Take 500 mcg by mouth daily. Vitamin B12, Disp: , Rfl:    hydrochlorothiazide (HYDRODIURIL) 25 MG tablet,  Take 12.5 mg by mouth daily., Disp: , Rfl:    lisinopril (PRINIVIL,ZESTRIL) 40 MG tablet, Take 1  tablet (40 mg total) by mouth daily. For hypertension. (Patient taking differently: Take 40 mg by mouth daily. For blood pressure), Disp: , Rfl:    MELATONIN PO, Take 1 tablet by mouth at bedtime., Disp: , Rfl:    propranolol (INDERAL) 10 MG tablet, Take 10 mg by mouth 2 (two) times daily., Disp: , Rfl:    rosuvastatin (CRESTOR) 40 MG tablet, Take 40 mg by mouth daily., Disp: , Rfl:    traZODone (DESYREL) 150 MG tablet, Take 150 mg by mouth at bedtime as needed for sleep., Disp: , Rfl:    venlafaxine XR (EFFEXOR-XR) 75 MG 24 hr capsule, Take 75 mg by mouth daily with breakfast., Disp: , Rfl:     Garner Nash, DO [facility] 08/19/2021 3:18 PM

## 2021-08-29 ENCOUNTER — Encounter (HOSPITAL_COMMUNITY): Payer: Self-pay | Admitting: Pulmonary Disease

## 2021-08-29 NOTE — Anesthesia Preprocedure Evaluation (Addendum)
Anesthesia Evaluation  Patient identified by MRN, date of birth, ID band Patient awake    Reviewed: Allergy & Precautions, NPO status , Patient's Chart, lab work & pertinent test results  Airway Mallampati: I  TM Distance: >3 FB Neck ROM: Full    Dental  (+) Edentulous Upper, Edentulous Lower   Pulmonary sleep apnea (non-compliant w/ CPAP) , COPD,  COPD inhaler, Current Smoker and Patient abstained from smoking.,  Current smoker, 110 pack year history- 1ppd  Multiple lung nodules   Pulmonary exam normal breath sounds clear to auscultation       Cardiovascular hypertension (poorly controlled, 169/81 in preop), Pt. on medications Normal cardiovascular exam Rhythm:Regular Rate:Normal  Echo 2020: normal, hyperdynamic LVEF 70%   Neuro/Psych PSYCHIATRIC DISORDERS Depression Bipolar Disorder negative neurological ROS     GI/Hepatic GERD  Controlled,(+)     substance abuse  alcohol use and cocaine use, Per wife on PAT call, pt still actively using crack cocaine  Per pt today, last use of cocaine was about 1 week ago   Endo/Other  negative endocrine ROS  Renal/GU CRFRenal diseaseCKD 3  negative genitourinary   Musculoskeletal negative musculoskeletal ROS (+)   Abdominal   Peds  Hematology negative hematology ROS (+)   Anesthesia Other Findings   Reproductive/Obstetrics negative OB ROS                            Anesthesia Physical Anesthesia Plan  ASA: 4  Anesthesia Plan: General   Post-op Pain Management:    Induction: Intravenous  PONV Risk Score and Plan: 1 and Ondansetron, Dexamethasone and Treatment may vary due to age or medical condition  Airway Management Planned: Oral ETT  Additional Equipment:   Intra-op Plan:   Post-operative Plan: Extubation in OR  Informed Consent: I have reviewed the patients History and Physical, chart, labs and discussed the procedure including  the risks, benefits and alternatives for the proposed anesthesia with the patient or authorized representative who has indicated his/her understanding and acceptance.     Dental advisory given  Plan Discussed with: CRNA  Anesthesia Plan Comments:        Anesthesia Quick Evaluation

## 2021-08-29 NOTE — Progress Notes (Signed)
Spoke with pt's wife Quita Skye for pre-op call. I talked with Quita Skye in September when pt was scheduled. He does not do phone calls. She states nothing has changed with his medical and surgical history. She states he is still using cocaine and smoking. I did ask her to tell not to do either as of now until after surgery. She said that he would not listen to her but she would tell him.  Pt tested positive for Covid on 07/15/21. He will not be tested day of surgery.

## 2021-08-30 ENCOUNTER — Ambulatory Visit (HOSPITAL_COMMUNITY)
Admission: RE | Admit: 2021-08-30 | Discharge: 2021-08-30 | Disposition: A | Discharge status: Home / Self Care | Payer: No Typology Code available for payment source | Source: Ambulatory Visit | Attending: Pulmonary Disease | Admitting: Pulmonary Disease

## 2021-08-30 ENCOUNTER — Encounter (HOSPITAL_COMMUNITY)
Admission: RE | Disposition: A | Discharge status: Home / Self Care | Payer: Self-pay | Source: Ambulatory Visit | Attending: Pulmonary Disease

## 2021-08-30 ENCOUNTER — Ambulatory Visit (HOSPITAL_COMMUNITY): Payer: No Typology Code available for payment source | Admitting: Anesthesiology

## 2021-08-30 ENCOUNTER — Other Ambulatory Visit: Payer: Self-pay

## 2021-08-30 ENCOUNTER — Ambulatory Visit (HOSPITAL_COMMUNITY): Payer: No Typology Code available for payment source

## 2021-08-30 ENCOUNTER — Encounter (HOSPITAL_COMMUNITY): Payer: Self-pay | Admitting: Pulmonary Disease

## 2021-08-30 DIAGNOSIS — Z8616 Personal history of COVID-19: Secondary | ICD-10-CM | POA: Insufficient documentation

## 2021-08-30 DIAGNOSIS — R590 Localized enlarged lymph nodes: Secondary | ICD-10-CM | POA: Diagnosis not present

## 2021-08-30 DIAGNOSIS — Z419 Encounter for procedure for purposes other than remedying health state, unspecified: Secondary | ICD-10-CM

## 2021-08-30 DIAGNOSIS — K219 Gastro-esophageal reflux disease without esophagitis: Secondary | ICD-10-CM | POA: Insufficient documentation

## 2021-08-30 DIAGNOSIS — G4730 Sleep apnea, unspecified: Secondary | ICD-10-CM | POA: Diagnosis not present

## 2021-08-30 DIAGNOSIS — F319 Bipolar disorder, unspecified: Secondary | ICD-10-CM | POA: Diagnosis not present

## 2021-08-30 DIAGNOSIS — F17210 Nicotine dependence, cigarettes, uncomplicated: Secondary | ICD-10-CM | POA: Diagnosis not present

## 2021-08-30 DIAGNOSIS — J449 Chronic obstructive pulmonary disease, unspecified: Secondary | ICD-10-CM | POA: Insufficient documentation

## 2021-08-30 DIAGNOSIS — R918 Other nonspecific abnormal finding of lung field: Secondary | ICD-10-CM

## 2021-08-30 DIAGNOSIS — I129 Hypertensive chronic kidney disease with stage 1 through stage 4 chronic kidney disease, or unspecified chronic kidney disease: Secondary | ICD-10-CM | POA: Insufficient documentation

## 2021-08-30 DIAGNOSIS — N183 Chronic kidney disease, stage 3 unspecified: Secondary | ICD-10-CM | POA: Insufficient documentation

## 2021-08-30 DIAGNOSIS — Z9889 Other specified postprocedural states: Secondary | ICD-10-CM

## 2021-08-30 DIAGNOSIS — R599 Enlarged lymph nodes, unspecified: Secondary | ICD-10-CM

## 2021-08-30 HISTORY — PX: BRONCHIAL BIOPSY: SHX5109

## 2021-08-30 HISTORY — PX: VIDEO BRONCHOSCOPY WITH ENDOBRONCHIAL ULTRASOUND: SHX6177

## 2021-08-30 HISTORY — PX: VIDEO BRONCHOSCOPY WITH RADIAL ENDOBRONCHIAL ULTRASOUND: SHX6849

## 2021-08-30 HISTORY — PX: BRONCHIAL NEEDLE ASPIRATION BIOPSY: SHX5106

## 2021-08-30 HISTORY — PX: BRONCHIAL BRUSHINGS: SHX5108

## 2021-08-30 HISTORY — PX: VIDEO BRONCHOSCOPY WITH ENDOBRONCHIAL NAVIGATION: SHX6175

## 2021-08-30 HISTORY — PX: FINE NEEDLE ASPIRATION: SHX5430

## 2021-08-30 HISTORY — PX: BRONCHIAL WASHINGS: SHX5105

## 2021-08-30 LAB — POCT I-STAT, CHEM 8
BUN: 17 mg/dL (ref 8–23)
Calcium, Ion: 1.27 mmol/L (ref 1.15–1.40)
Chloride: 103 mmol/L (ref 98–111)
Creatinine, Ser: 0.8 mg/dL (ref 0.61–1.24)
Glucose, Bld: 118 mg/dL — ABNORMAL HIGH (ref 70–99)
HCT: 39 % (ref 39.0–52.0)
Hemoglobin: 13.3 g/dL (ref 13.0–17.0)
Potassium: 3.9 mmol/L (ref 3.5–5.1)
Sodium: 139 mmol/L (ref 135–145)
TCO2: 25 mmol/L (ref 22–32)

## 2021-08-30 LAB — COMPREHENSIVE METABOLIC PANEL
ALT: 30 U/L (ref 0–44)
AST: 25 U/L (ref 15–41)
Albumin: 3.4 g/dL — ABNORMAL LOW (ref 3.5–5.0)
Alkaline Phosphatase: 45 U/L (ref 38–126)
Anion gap: 8 (ref 5–15)
BUN: 17 mg/dL (ref 8–23)
CO2: 26 mmol/L (ref 22–32)
Calcium: 9.7 mg/dL (ref 8.9–10.3)
Chloride: 102 mmol/L (ref 98–111)
Creatinine, Ser: 0.83 mg/dL (ref 0.61–1.24)
GFR, Estimated: >60 mL/min (ref >60)
Glucose, Bld: 114 mg/dL — ABNORMAL HIGH (ref 70–99)
Potassium: 3.9 mmol/L (ref 3.5–5.1)
Sodium: 136 mmol/L (ref 135–145)
Total Bilirubin: 1.1 mg/dL (ref 0.3–1.2)
Total Protein: 6.6 g/dL (ref 6.5–8.1)

## 2021-08-30 LAB — CBC
HCT: 39.9 % (ref 39.0–52.0)
Hemoglobin: 14.1 g/dL (ref 13.0–17.0)
MCH: 31.6 pg (ref 26.0–34.0)
MCHC: 35.3 g/dL (ref 30.0–36.0)
MCV: 89.5 fL (ref 80.0–100.0)
Platelets: 134 10*3/uL — ABNORMAL LOW (ref 150–400)
RBC: 4.46 MIL/uL (ref 4.22–5.81)
RDW: 11.4 % — ABNORMAL LOW (ref 11.5–15.5)
WBC: 6.1 10*3/uL (ref 4.0–10.5)
nRBC: 0 % (ref 0.0–0.2)

## 2021-08-30 LAB — RAPID URINE DRUG SCREEN, HOSP PERFORMED
Amphetamines: NOT DETECTED
Barbiturates: NOT DETECTED
Benzodiazepines: NOT DETECTED
Cocaine: POSITIVE — AB
Opiates: NOT DETECTED
Tetrahydrocannabinol: NOT DETECTED

## 2021-08-30 SURGERY — VIDEO BRONCHOSCOPY WITH ENDOBRONCHIAL NAVIGATION
Anesthesia: General | Laterality: Left

## 2021-08-30 MED ORDER — DEXAMETHASONE SODIUM PHOSPHATE 10 MG/ML IJ SOLN
INTRAMUSCULAR | Status: DC | PRN
  Administered 2021-08-30: 10 mg via INTRAVENOUS

## 2021-08-30 MED ORDER — ONDANSETRON HCL 4 MG/2ML IJ SOLN
INTRAMUSCULAR | Status: DC | PRN
  Administered 2021-08-30: 4 mg via INTRAVENOUS

## 2021-08-30 MED ORDER — PHENYLEPHRINE 40 MCG/ML (10ML) SYRINGE FOR IV PUSH (FOR BLOOD PRESSURE SUPPORT)
PREFILLED_SYRINGE | INTRAVENOUS | Status: DC | PRN
  Administered 2021-08-30: 80 ug via INTRAVENOUS
  Administered 2021-08-30: 40 ug via INTRAVENOUS
  Administered 2021-08-30: 80 ug via INTRAVENOUS
  Administered 2021-08-30 (×2): 40 ug via INTRAVENOUS
  Administered 2021-08-30: 80 ug via INTRAVENOUS

## 2021-08-30 MED ORDER — LIDOCAINE 2% (20 MG/ML) 5 ML SYRINGE
INTRAMUSCULAR | Status: DC | PRN
  Administered 2021-08-30: 60 mg via INTRAVENOUS

## 2021-08-30 MED ORDER — PHENYLEPHRINE HCL (PRESSORS) 10 MG/ML IV SOLN: INTRAVENOUS | Status: DC | PRN

## 2021-08-30 MED ORDER — FENTANYL CITRATE (PF) 100 MCG/2ML IJ SOLN: 25.0000 ug | INTRAMUSCULAR | Status: DC | PRN

## 2021-08-30 MED ORDER — PROPOFOL 10 MG/ML IV BOLUS
INTRAVENOUS | Status: DC | PRN
  Administered 2021-08-30: 100 mg via INTRAVENOUS

## 2021-08-30 MED ORDER — MIDAZOLAM HCL 2 MG/2ML IJ SOLN
INTRAMUSCULAR | Status: DC | PRN
  Administered 2021-08-30: 2 mg via INTRAVENOUS

## 2021-08-30 MED ORDER — PHENYLEPHRINE HCL-NACL 20-0.9 MG/250ML-% IV SOLN
INTRAVENOUS | Status: DC | PRN
  Administered 2021-08-30: 50 ug/min via INTRAVENOUS

## 2021-08-30 MED ORDER — SUGAMMADEX SODIUM 200 MG/2ML IV SOLN
INTRAVENOUS | Status: DC | PRN
  Administered 2021-08-30: 200 mg via INTRAVENOUS

## 2021-08-30 MED ORDER — SUCCINYLCHOLINE CHLORIDE 200 MG/10ML IV SOSY
PREFILLED_SYRINGE | INTRAVENOUS | Status: DC | PRN
  Administered 2021-08-30: 120 mg via INTRAVENOUS

## 2021-08-30 MED ORDER — LACTATED RINGERS IV SOLN: INTRAVENOUS | Status: DC

## 2021-08-30 MED ORDER — ROCURONIUM BROMIDE 10 MG/ML (PF) SYRINGE
PREFILLED_SYRINGE | INTRAVENOUS | Status: DC | PRN
  Administered 2021-08-30: 40 mg via INTRAVENOUS
  Administered 2021-08-30 (×4): 10 mg via INTRAVENOUS

## 2021-08-30 MED ORDER — ONDANSETRON HCL 4 MG/2ML IJ SOLN: 4.0000 mg | Freq: Once | INTRAMUSCULAR | Status: DC | PRN

## 2021-08-30 MED ORDER — FENTANYL CITRATE (PF) 100 MCG/2ML IJ SOLN
INTRAMUSCULAR | Status: DC | PRN
  Administered 2021-08-30: 100 ug via INTRAVENOUS

## 2021-08-30 MED ORDER — CHLORHEXIDINE GLUCONATE 0.12 % MT SOLN
OROMUCOSAL | Status: AC
  Administered 2021-08-30: 15 mL
  Filled 2021-08-30: qty 15

## 2021-08-30 SURGICAL SUPPLY — 51 items
ADAPTER BRONCH F/PENTAX (ADAPTER) ×4 IMPLANT
ADAPTER VALVE BIOPSY EBUS (MISCELLANEOUS) IMPLANT
ADPTR VALVE BIOPSY EBUS (MISCELLANEOUS)
BRUSH CYTOL CELLEBRITY 1.5X140 (MISCELLANEOUS) ×4 IMPLANT
BRUSH SUPERTRAX BIOPSY (INSTRUMENTS) IMPLANT
BRUSH SUPERTRAX NDL-TIP CYTO (INSTRUMENTS) ×4 IMPLANT
CANISTER SUCT 3000ML PPV (MISCELLANEOUS) ×4 IMPLANT
CHANNEL WORK EXTEND EDGE 180 (KITS) IMPLANT
CHANNEL WORK EXTEND EDGE 45 (KITS) IMPLANT
CHANNEL WORK EXTEND EDGE 90 (KITS) IMPLANT
CONT SPEC 4OZ CLIKSEAL STRL BL (MISCELLANEOUS) ×4 IMPLANT
COVER BACK TABLE 60X90IN (DRAPES) ×4 IMPLANT
COVER DOME SNAP 22 D (MISCELLANEOUS) ×4 IMPLANT
FILTER STRAW FLUID ASPIR (MISCELLANEOUS) IMPLANT
FORCEPS BIOP RJ4 1.8 (CUTTING FORCEPS) IMPLANT
FORCEPS BIOP SUPERTRX PREMAR (INSTRUMENTS) ×4 IMPLANT
GAUZE SPONGE 4X4 12PLY STRL (GAUZE/BANDAGES/DRESSINGS) ×4 IMPLANT
GLOVE BIO SURGEON STRL SZ7.5 (GLOVE) ×4 IMPLANT
GLOVE SURG SS PI 7.5 STRL IVOR (GLOVE) ×8 IMPLANT
GOWN STRL REUS W/ TWL LRG LVL3 (GOWN DISPOSABLE) ×4 IMPLANT
GOWN STRL REUS W/TWL LRG LVL3 (GOWN DISPOSABLE) ×4
KIT CLEAN ENDO COMPLIANCE (KITS) ×8 IMPLANT
KIT LOCATABLE GUIDE (CANNULA) IMPLANT
KIT MARKER FIDUCIAL DELIVERY (KITS) IMPLANT
KIT PROCEDURE EDGE 180 (KITS) IMPLANT
KIT PROCEDURE EDGE 45 (KITS) IMPLANT
KIT PROCEDURE EDGE 90 (KITS) IMPLANT
KIT TURNOVER KIT B (KITS) ×4 IMPLANT
MARKER SKIN DUAL TIP RULER LAB (MISCELLANEOUS) ×4 IMPLANT
NEEDLE EBUS SONO TIP PENTAX (NEEDLE) ×4 IMPLANT
NEEDLE SUPERTRX PREMARK BIOPSY (NEEDLE) ×4 IMPLANT
NS IRRIG 1000ML POUR BTL (IV SOLUTION) ×4 IMPLANT
OIL SILICONE PENTAX (PARTS (SERVICE/REPAIRS)) ×4 IMPLANT
PAD ARMBOARD 7.5X6 YLW CONV (MISCELLANEOUS) ×8 IMPLANT
PATCHES PATIENT (LABEL) ×12 IMPLANT
SOL ANTI FOG 6CC (MISCELLANEOUS) ×2 IMPLANT
SOLUTION ANTI FOG 6CC (MISCELLANEOUS) ×2
SYR 20CC LL (SYRINGE) ×8 IMPLANT
SYR 20ML ECCENTRIC (SYRINGE) ×8 IMPLANT
SYR 50ML SLIP (SYRINGE) ×4 IMPLANT
SYR 5ML LUER SLIP (SYRINGE) ×4 IMPLANT
TOWEL OR 17X24 6PK STRL BLUE (TOWEL DISPOSABLE) ×4 IMPLANT
TRAP SPECIMEN MUCOUS 40CC (MISCELLANEOUS) IMPLANT
TUBE CONNECTING 20'X1/4 (TUBING) ×2
TUBE CONNECTING 20X1/4 (TUBING) ×6 IMPLANT
UNDERPAD 30X30 (UNDERPADS AND DIAPERS) ×4 IMPLANT
VALVE BIOPSY  SINGLE USE (MISCELLANEOUS) ×2
VALVE BIOPSY SINGLE USE (MISCELLANEOUS) ×2 IMPLANT
VALVE DISPOSABLE (MISCELLANEOUS) ×4 IMPLANT
VALVE SUCTION BRONCHIO DISP (MISCELLANEOUS) ×4 IMPLANT
WATER STERILE IRR 1000ML POUR (IV SOLUTION) ×4 IMPLANT

## 2021-08-30 NOTE — Interval H&P Note (Signed)
History and Physical Interval Note:  08/30/2021 7:24 AM  Austin Wells  has presented today for surgery, with the diagnosis of lung nodule, adenopathy.  The various methods of treatment have been discussed with the patient and family. After consideration of risks, benefits and other options for treatment, the patient has consented to  Procedure(s) with comments: [place] (Left) - ION w/ CIOS VIDEO BRONCHOSCOPY WITH ENDOBRONCHIAL ULTRASOUND (Left) as a surgical intervention.  The patient's history has been reviewed, patient examined, no change in status, stable for surgery.  I have reviewed the patient's chart and labs.  Questions were answered to the patient's satisfaction.     [place]

## 2021-08-30 NOTE — Transfer of Care (Signed)
Immediate Anesthesia Transfer of Care Note  Patient: Austin Wells  Procedure(s) Performed: VIDEO BRONCHOSCOPY WITH ENDOBRONCHIAL NAVIGATION (Left) VIDEO BRONCHOSCOPY WITH ENDOBRONCHIAL ULTRASOUND (Left) BRONCHIAL BRUSHINGS BRONCHIAL NEEDLE ASPIRATION BIOPSIES BRONCHIAL BIOPSIES BRONCHIAL WASHINGS FINE NEEDLE ASPIRATION (FNA) LINEAR  Patient Location: PACU  Anesthesia Type:General  Level of Consciousness: drowsy and patient cooperative  Airway & Oxygen Therapy: Patient Spontanous Breathing and Patient connected to nasal cannula oxygen  Post-op Assessment: Report given to RN, Post -op Vital signs reviewed and stable and Patient moving all extremities  Post vital signs: Reviewed and stable  Last Vitals:  Vitals Value Taken Time  BP 110/63 08/30/21 0951  Temp    Pulse 77 08/30/21 0952  Resp 19 08/30/21 0952  SpO2 95 % 08/30/21 0952  Vitals shown include unvalidated device data.  Last Pain:  Vitals:   08/30/21 0646  TempSrc:   PainSc: 0-No pain         Complications: No notable events documented.

## 2021-08-30 NOTE — Discharge Instructions (Signed)
Flexible Bronchoscopy, Care After This sheet gives you information about how to care for yourself after your test. Your doctor may also give you more specific instructions. If you have problems or questions, contact your doctor. Follow these instructions at home: Eating and drinking Do not eat or drink anything (not even water) for 2 hours after your test, or until your numbing medicine (local anesthetic) wears off. When your numbness is gone and your cough and gag reflexes have come back, you may: Eat only soft foods. Slowly drink liquids. The day after the test, go back to your normal diet. Driving Do not drive for 24 hours if you were given a medicine to help you relax (sedative). Do not drive or use heavy machinery while taking prescription pain medicine. General instructions  Take over-the-counter and prescription medicines only as told by your doctor. Return to your normal activities as told. Ask what activities are safe for you. Do not use any products that have nicotine or tobacco in them. This includes cigarettes and e-cigarettes. If you need help quitting, ask your doctor. Keep all follow-up visits as told by your doctor. This is important. It is very important if you had a tissue sample (biopsy) taken. Get help right away if: You have shortness of breath that gets worse. You get light-headed. You feel like you are going to pass out (faint). You have chest pain. You cough up: More than a little blood. More blood than before. Summary Do not eat or drink anything (not even water) for 2 hours after your test, or until your numbing medicine wears off. Do not use cigarettes. Do not use e-cigarettes. Get help right away if you have chest pain.  This information is not intended to replace advice given to you by your health care provider. Make sure you discuss any questions you have with your health care provider. Document Released: 08/06/2009 Document Revised: 09/21/2017 Document  Reviewed: 10/27/2016 Elsevier Patient Education  2020 [employer].

## 2021-08-30 NOTE — Op Note (Signed)
Video Bronchoscopy with Robotic Assisted Bronchoscopic Navigation Video bronchoscopy with endobronchial ultrasound  Date of Operation: 08/30/2021   Pre-op Diagnosis: Pulmonary nodules, adenopathy  Post-op Diagnosis: Pulmonary nodules, adenopathy  Surgeon: Garner Nash, DO   Assistants: None   Anesthesia: General endotracheal anesthesia  Operation: Flexible video fiberoptic bronchoscopy with robotic assistance and biopsies.  Estimated Blood Loss: Minimal  Complications: None  Indications and History: Austin Wells is a 75 y.o. male with history of pulmonary nodules, adenopathy. The risks, benefits, complications, treatment options and expected outcomes were discussed with the patient.  The possibilities of pneumothorax, pneumonia, reaction to medication, pulmonary aspiration, perforation of a viscus, bleeding, failure to diagnose a condition and creating a complication requiring transfusion or operation were discussed with the patient who freely signed the consent.    Description of Procedure: The patient was seen in the Preoperative Area, was examined and was deemed appropriate to proceed.  The patient was taken to [facility] endoscopy room 3, identified as Austin Wells and the procedure verified as Flexible Video Fiberoptic Bronchoscopy.  A Time Out was held and the above information confirmed.   Prior to the date of the procedure a high-resolution CT scan of the chest was performed. Utilizing ION software program a virtual tracheobronchial tree was generated to allow the creation of distinct navigation pathways to the patient's parenchymal abnormalities. After being taken to the operating room general anesthesia was initiated and the patient  was orally intubated. The video fiberoptic bronchoscope was introduced via the endotracheal tube and a general inspection was performed which showed normal right and left lung anatomy, aspiration of the bilateral mainstems was completed to remove any  remaining secretions. Robotic catheter inserted into patient's endotracheal tube.   Target #1 left lower lobe pleural-based lesion: The distinct navigation pathways prepared prior to this procedure were then utilized to navigate to patient's lesion identified on CT scan. The robotic catheter was secured into place and the vision probe was withdrawn.  Lesion location was approximated using fluoroscopy and radial endobronchial ultrasound for peripheral targeting.  Using a three-dimensional cone beam CT imaging to was confirmed in lesion.  Under fluoroscopic guidance transbronchial needle brushings, transbronchial needle biopsies, and transbronchial forceps biopsies were performed to be sent for cytology and pathology. A bronchioalveolar lavage was performed in the left lower lobe and sent for cultures.  Target #2 EBUS left hilar 10 L: The standard scope was then withdrawn and the endobronchial ultrasound was used to identify and characterize the peritracheal, hilar and bronchial lymph nodes. Inspection showed normal right and left lung anatomy, enlarged left hilar nodes. Using real-time ultrasound guidance Wang needle biopsies were take from Station 10 L nodes and were sent for cytology. The patient tolerated the procedure well without apparent complications. There was no significant blood loss. The bronchoscope was withdrawn.  At the end of the procedure a general airway inspection was performed and there was no evidence of active bleeding. The bronchoscope was removed.  The patient tolerated the procedure well. There was no significant blood loss and there were no obvious complications. A post-procedural chest x-ray is pending.  Samples Target #1: 1. Transbronchial needle brushings from left lower lobe 2. Transbronchial Wang needle biopsies from left lower lobe 3. Transbronchial forceps biopsies from left lower lobe 4. Bronchoalveolar lavage from left lower lobe  Samples Target #2: 1.   Transbronchial needle aspiration 10 L  Plans:  The patient will be discharged from the PACU to home when recovered from anesthesia and after chest  x-ray is reviewed. We will review the cytology, pathology and microbiology results with the patient when they become available. Outpatient followup will be with Garner Nash, [place], DO [facility] 08/30/2021 10:00 AM

## 2021-08-30 NOTE — Anesthesia Procedure Notes (Signed)
Procedure Name: Intubation Date/Time: 08/30/2021 7:46 AM Performed by: Moshe Salisbury, CRNA Pre-anesthesia Checklist: Patient identified, Emergency Drugs available, Suction available and Patient being monitored Patient Re-evaluated:Patient Re-evaluated prior to induction Oxygen Delivery Method: Circle System Utilized Preoxygenation: Pre-oxygenation with 100% oxygen Induction Type: IV induction and Rapid sequence Ventilation: Mask ventilation without difficulty Laryngoscope Size: Mac and 4 Grade View: Grade I Tube type: Oral Tube size: 8.5 mm Number of attempts: 1 Airway Equipment and Method: Stylet Placement Confirmation: ETT inserted through vocal cords under direct vision, positive ETCO2 and breath sounds checked- equal and bilateral Secured at: 21 cm Tube secured with: Tape Dental Injury: Teeth and Oropharynx as per pre-operative assessment

## 2021-08-30 NOTE — Anesthesia Postprocedure Evaluation (Signed)
Anesthesia Post Note  Patient: Austin Wells  Procedure(s) Performed: VIDEO BRONCHOSCOPY WITH ENDOBRONCHIAL NAVIGATION (Left) VIDEO BRONCHOSCOPY WITH ENDOBRONCHIAL ULTRASOUND (Left) BRONCHIAL BRUSHINGS BRONCHIAL NEEDLE ASPIRATION BIOPSIES BRONCHIAL BIOPSIES BRONCHIAL WASHINGS FINE NEEDLE ASPIRATION (FNA) LINEAR     Patient location during evaluation: PACU Anesthesia Type: General Level of consciousness: awake and alert, oriented and patient cooperative Pain management: pain level controlled Vital Signs Assessment: post-procedure vital signs reviewed and stable Respiratory status: spontaneous breathing, nonlabored ventilation and respiratory function stable Cardiovascular status: blood pressure returned to baseline and stable Postop Assessment: no apparent nausea or vomiting Anesthetic complications: no   No notable events documented.  Last Vitals:  Vitals:   08/30/21 0952 08/30/21 1006  BP: 110/63 (!) 93/59  Pulse: 77 77  Resp: 19 20  Temp: (!) 36.2 C   SpO2: 95% 91%    Last Pain:  Vitals:   08/30/21 1006  TempSrc:   PainSc: 0-No pain                 Pervis Hocking

## 2021-08-31 ENCOUNTER — Encounter (HOSPITAL_COMMUNITY): Payer: Self-pay | Admitting: Pulmonary Disease

## 2021-08-31 LAB — ACID FAST SMEAR (AFB, MYCOBACTERIA)
Acid Fast Smear: NEGATIVE
Acid Fast Smear: NEGATIVE

## 2021-09-01 LAB — CULTURE, BAL-QUANTITATIVE W GRAM STAIN
Culture: NO GROWTH
Gram Stain: NONE SEEN

## 2021-09-01 LAB — CYTOLOGY - NON PAP

## 2021-09-02 LAB — CYTOLOGY - NON PAP

## 2021-09-02 NOTE — Progress Notes (Signed)
Beth, I know that he is seeing you next week on 14th.  We will need to discuss either short-term CT scan follow-up versus consideration for repeat biopsy likely via interventional radiology.  However his pathology results at the moment only show atypical cells.  Thanks,  BLI  Garner Nash, DO [facility] 09/02/2021 10:37 AM

## 2021-09-02 NOTE — Progress Notes (Signed)
I called and spoke with the patient's wife regarding path results.  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  Please place referral to Dr. Earlie Server in medical oncology.  Thanks,  BLI  Garner Nash, DO [facility] 09/02/2021 4:53 PM

## 2021-09-03 ENCOUNTER — Other Ambulatory Visit: Payer: Self-pay | Admitting: *Deleted

## 2021-09-03 DIAGNOSIS — R942 Abnormal results of pulmonary function studies: Secondary | ICD-10-CM

## 2021-09-03 DIAGNOSIS — C3490 Malignant neoplasm of unspecified part of unspecified bronchus or lung: Secondary | ICD-10-CM

## 2021-09-03 NOTE — Progress Notes (Signed)
I have placed the referral

## 2021-09-04 LAB — AEROBIC/ANAEROBIC CULTURE W GRAM STAIN (SURGICAL/DEEP WOUND)
Culture: NO GROWTH
Gram Stain: NONE SEEN

## 2021-09-05 ENCOUNTER — Telehealth: Payer: Self-pay | Admitting: Internal Medicine

## 2021-09-05 ENCOUNTER — Encounter: Payer: Self-pay | Admitting: *Deleted

## 2021-09-05 ENCOUNTER — Other Ambulatory Visit: Payer: Self-pay

## 2021-09-05 ENCOUNTER — Ambulatory Visit (INDEPENDENT_AMBULATORY_CARE_PROVIDER_SITE_OTHER): Payer: No Typology Code available for payment source | Admitting: Primary Care

## 2021-09-05 ENCOUNTER — Encounter: Payer: Self-pay | Admitting: Primary Care

## 2021-09-05 DIAGNOSIS — R918 Other nonspecific abnormal finding of lung field: Secondary | ICD-10-CM

## 2021-09-05 DIAGNOSIS — J438 Other emphysema: Secondary | ICD-10-CM

## 2021-09-05 MED ORDER — SPIRIVA RESPIMAT 2.5 MCG/ACT IN AERS: 2.0000 | INHALATION_SPRAY | Freq: Every day | RESPIRATORY_TRACT | 0 refills | Status: DC

## 2021-09-05 MED ORDER — SPIRIVA RESPIMAT 2.5 MCG/ACT IN AERS: 2.0000 | INHALATION_SPRAY | Freq: Every day | RESPIRATORY_TRACT | 5 refills | Status: DC

## 2021-09-05 NOTE — Progress Notes (Signed)
I received referral on Austin Wells today.  I notified new patient coordinator to call and schedule him to be seen this Friday on 11/18 if approved with insurance.

## 2021-09-05 NOTE — Telephone Encounter (Signed)
Scheduled appt per 11/11 referral. Pt's wife is aware of appt date and time.

## 2021-09-05 NOTE — Progress Notes (Signed)
_0  ID: [name], male    DOB: [birth year], 75 y.o.   MRN: [id number]  Chief Complaint  Patient presents with   Follow-up    Referring provider: [facility], [facility]  HPI: 75 year old male, current every day smoker. PMH significant for multiple lung nodules, adenopathy, CKD stage III, hypertension, bipolar disorder, tobacco abuse. Patient of Dr. Valeta Harms, last seen on 08/19/21.  Previous [facility] pulmonary encounter: OV 08/19/2021: Here today for follow-up after having 2 prior bronchoscopies scheduled.  Unfortunately at his first bronchoscopy tested positive for COVID-19 and had to be rescheduled.  His bronchoscopy was rescheduled and then he did not show up for the day of his bronchoscopy.  Therefore patient was seen today in the office to discuss the importance of rescheduling this and clarifying a day in which she could ensure that he would be present.  We also made sure we had telephone numbers that were appropriate for reaching his wife.  Patient was also counseled on the pertinence of stopping cocaine use which we also discussed and was revealed prior to his previous bronchoscopy.    Discussion: This is a 40 year old gentleman, PET scan recently completed with multiple pulmonary nodules and a small associated hypermetabolic adenopathy within the left hilum.  Concern for malignancy.  We discussed this today in the office.  We also discussed the pertinence of if we do schedule a repeat bronchoscopy that he was going to show up for this procedure.  Plan: We discussed the risk benefits and alternatives again of proceeding with right robotic assisted bronchoscopy and tissue sampling. Patient understands a he has bilateral pulmonary nodules and a sampling of both as well as the mediastinal node that is enlarged and abnormal in the PET scan. Patient is agreeable to this plan. We will plan for bronchoscopy again on 08/30/2021. We will also call his wife to confirm. His wife is not present  today in the office but we do have her phone number. Will need to make sure that this is a reliable date in which he will be able to be present and show up for the procedure. We also counseled him on smoking cessation and illicit drug use.    09/05/2021- Interim hx  Patient presents today for post-bronchoscopy follow-up. Patient underwent flexible bronchoscopy with robotic assistance/biopsies on 08/30/21 with Dr. Valeta Harms for multiple pulmonary lung nodules, adenopathy. He tolerated bronchoscopy procedure well without issue. He experienced no episodes of hemoptysis or chest discomfort. Dr. Valeta Harms spoke with patient's wife regarding path results.  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 He has been referral to Dr. Julien Nordmann in medical oncology and has an apt on Friday November 18th. He states that his breathing is not great, he gets winded with minimal exertion. He is still smoking but trying to cut back. Denies chest tightness, wheezing or cough.    No Known Allergies  Immunization History  Administered Date(s) Administered   Fluad Quad(high Dose 65+) 07/19/2020, 08/19/2021   Influenza, High Dose Seasonal PF 08/05/2011   Influenza-Unspecified 09/01/2010, 08/29/2012, 07/09/2013, 08/13/2014, 09/14/2015   Janssen (J&J) SARS-COV-2 Vaccination 03/18/2020, 10/27/2020   Moderna Sars-Covid-2 Vaccination 04/19/2021   Pneumococcal Conjugate-13 06/05/2014   Pneumococcal-Unspecified 02/13/2013   Tdap 12/06/2011, 07/31/2012   Zoster Recombinat (Shingrix) 04/09/2020, 07/19/2020    Past Medical History:  Diagnosis Date   Bipolar disorder ([place])    Depression    GERD (gastroesophageal reflux disease)    Hyperlipidemia    Hypertension    Orthostatic  hypotension    Sleep apnea    has a cpap and doesn't use it   Syncope and collapse    Tobacco use     Tobacco History: Social History   Tobacco Use  Smoking Status Every Day   Packs/day: 2.00   Years: 55.00   Pack  years: 110.00   Types: Cigarettes   Start date: 1967  Smokeless Tobacco Never  Tobacco Comments   Currently smoking 1.5ppd as of 10/28/22ep   Ready to quit: Not Answered Counseling given: Not Answered Tobacco comments: Currently smoking 1.5ppd as of 10/28/22ep   Outpatient Medications Prior to Visit  Medication Sig Dispense Refill   ARIPiprazole (ABILIFY) 5 MG tablet Take 5 mg by mouth daily. For mood stabilization and depression     calcium-vitamin D (OSCAL WITH D) 500-200 MG-UNIT tablet Take 1 tablet by mouth daily.     cyanocobalamin 500 MCG tablet Take 500 mcg by mouth daily. Vitamin B12     hydrochlorothiazide (HYDRODIURIL) 25 MG tablet Take 12.5 mg by mouth daily.     lisinopril (PRINIVIL,ZESTRIL) 40 MG tablet Take 1 tablet (40 mg total) by mouth daily. For hypertension. (Patient taking differently: Take 40 mg by mouth daily. For blood pressure)     MELATONIN PO Take 1 tablet by mouth at bedtime.     propranolol (INDERAL) 10 MG tablet Take 10 mg by mouth 2 (two) times daily.     rosuvastatin (CRESTOR) 40 MG tablet Take 40 mg by mouth daily.     traZODone (DESYREL) 150 MG tablet Take 150 mg by mouth at bedtime as needed for sleep.     venlafaxine XR (EFFEXOR-XR) 75 MG 24 hr capsule Take 75 mg by mouth daily with breakfast.     No facility-administered medications prior to visit.   Review of Systems  Review of Systems  Constitutional: Negative.   HENT: Negative.    Respiratory:  Positive for shortness of breath. Negative for cough, chest tightness and wheezing.     Physical Exam  BP 110/68 (BP Location: Left Arm, Patient Position: Sitting, Cuff Size: Normal)   Pulse 72   Temp 97.6 F (36.4 C) (Oral)   Ht _0  (1.803 m)   Wt 177 lb 6.4 oz (80.5 kg)   SpO2 98%   BMI 24.74 kg/m  Physical Exam Constitutional:      Appearance: Normal appearance.  HENT:     Mouth/Throat:     Mouth: Mucous membranes are moist.     Pharynx: Oropharynx is clear.  Cardiovascular:      Rate and Rhythm: Normal rate and regular rhythm.  Pulmonary:     Effort: Pulmonary effort is normal.     Breath sounds: Normal breath sounds. No wheezing, rhonchi or rales.  Skin:    General: Skin is warm and dry.  Neurological:     General: No focal deficit present.     Mental Status: He is alert and oriented to person, place, and time. Mental status is at baseline.  Psychiatric:        Mood and Affect: Mood normal.        Behavior: Behavior normal.        Thought Content: Thought content normal.        Judgment: Judgment normal.     Lab Results:  CBC    Component Value Date/Time   WBC 6.1 08/30/2021 0714   RBC 4.46 08/30/2021 0714   HGB 14.1 08/30/2021 0714   HCT 39.9 08/30/2021 0714  PLT 134 (L) 08/30/2021 0714   MCV 89.5 08/30/2021 0714   MCH 31.6 08/30/2021 0714   MCHC 35.3 08/30/2021 0714   RDW 11.4 (L) 08/30/2021 0714   LYMPHSABS 1.8 07/15/2019 2308   MONOABS 0.9 07/15/2019 2308   EOSABS 0.1 07/15/2019 2308   BASOSABS 0.0 07/15/2019 2308    BMET    Component Value Date/Time   NA 136 08/30/2021 0714   K 3.9 08/30/2021 0714   CL 102 08/30/2021 0714   CO2 26 08/30/2021 0714   GLUCOSE 114 (H) 08/30/2021 0714   BUN 17 08/30/2021 0714   CREATININE 0.83 08/30/2021 0714   CALCIUM 9.7 08/30/2021 0714   GFRNONAA >60 08/30/2021 0714   GFRAA >60 07/16/2019 0405    BNP No results found for: BNP  ProBNP No results found for: PROBNP  Imaging: DG CHEST PORT 1 VIEW  Result Date: 08/30/2021 CLINICAL DATA:  Status post bronchoscopy. EXAM: PORTABLE CHEST 1 VIEW COMPARISON:  CT chest dated July 15, 2021. Chest x-ray dated February 03, 2017. FINDINGS: Normal heart size. Mild peripheral and basilar predominant interstitial thickening is similar to recent CT, allowing for differences in technique, but has progressed since chest x-ray from April 2018. No focal consolidation, pleural effusion, or pneumothorax. No acute osseous abnormality. IMPRESSION: 1. No pneumothorax.  2. Unchanged interstitial lung disease. Electronically Signed   By: Titus Dubin M.D.   On: 08/30/2021 10:27   DG C-ARM BRONCHOSCOPY  Result Date: 08/30/2021 C-ARM BRONCHOSCOPY: Fluoroscopy was utilized by the requesting physician.  No radiographic interpretation.     Assessment & Plan:   Multiple lung nodules - Patient underwent bronchoscopy on 08/30/21 with Dr. Valeta Harms, pathology positive for small cell carcinoma within the 6m lymph nodue. He has an apt with Dr. MJulien Nordmannon Friday November 18th.   Other emphysema ([place] - Patient has paraseptal and centrilobular emphysema on imaging. He complains of moderate dyspnea symptoms with any exertion. We will start patient on trial Sprivia Respimat 2.528m two puffs once daily (Unfortunately he left un-intentionally without AVS or sample Spiriva. He was been notified that he can come to office and pick up any time this week).    ElMartyn EhrichNP 09/05/2021

## 2021-09-05 NOTE — Patient Instructions (Addendum)
Recommendations: - Follow up with Dr. Earlie Server is scheduled for this Friday to discuss treatment for small cell lung cancer  - Start Spiriva Respimat - take two puffs every morning (do not use more than prescribed). Rx sent to pharmacy, please fill if you notice benefit in breathing with use)  - Encourage you continue to cut back on smoking and pick quit date  Follow-up: - 3 months with Dr. Valeta Harms

## 2021-09-05 NOTE — Assessment & Plan Note (Addendum)
-   Patient underwent bronchoscopy on 08/30/21 with Dr. Valeta Harms, pathology positive for small cell carcinoma within the 33mm lymph nodue. He has an apt with Dr. Julien Nordmann on Friday November 18th.

## 2021-09-05 NOTE — Assessment & Plan Note (Addendum)
-   Patient has paraseptal and centrilobular emphysema on imaging. He complains of moderate dyspnea symptoms with any exertion. We will start patient on trial Sprivia Respimat 2.82mcg two puffs once daily (Unfortunately he left un-intentionally without AVS or sample Spiriva. He was been notified that he can come to office and pick up any time this week).

## 2021-09-09 ENCOUNTER — Other Ambulatory Visit: Payer: Self-pay

## 2021-09-09 ENCOUNTER — Inpatient Hospital Stay (HOSPITAL_BASED_OUTPATIENT_CLINIC_OR_DEPARTMENT_OTHER): Payer: No Typology Code available for payment source | Admitting: Internal Medicine

## 2021-09-09 ENCOUNTER — Encounter: Payer: Self-pay | Admitting: *Deleted

## 2021-09-09 ENCOUNTER — Telehealth: Payer: Self-pay

## 2021-09-09 ENCOUNTER — Inpatient Hospital Stay: Payer: No Typology Code available for payment source | Attending: Internal Medicine

## 2021-09-09 ENCOUNTER — Inpatient Hospital Stay (HOSPITAL_BASED_OUTPATIENT_CLINIC_OR_DEPARTMENT_OTHER): Payer: No Typology Code available for payment source | Admitting: *Deleted

## 2021-09-09 DIAGNOSIS — C3490 Malignant neoplasm of unspecified part of unspecified bronchus or lung: Secondary | ICD-10-CM | POA: Insufficient documentation

## 2021-09-09 DIAGNOSIS — C3481 Malignant neoplasm of overlapping sites of right bronchus and lung: Secondary | ICD-10-CM

## 2021-09-09 DIAGNOSIS — C3411 Malignant neoplasm of upper lobe, right bronchus or lung: Secondary | ICD-10-CM | POA: Diagnosis present

## 2021-09-09 DIAGNOSIS — Z79899 Other long term (current) drug therapy: Secondary | ICD-10-CM | POA: Insufficient documentation

## 2021-09-09 DIAGNOSIS — Z5112 Encounter for antineoplastic immunotherapy: Secondary | ICD-10-CM

## 2021-09-09 DIAGNOSIS — Z5111 Encounter for antineoplastic chemotherapy: Secondary | ICD-10-CM | POA: Diagnosis present

## 2021-09-09 DIAGNOSIS — R918 Other nonspecific abnormal finding of lung field: Secondary | ICD-10-CM

## 2021-09-09 LAB — CBC WITH DIFFERENTIAL (CANCER CENTER ONLY)
Abs Immature Granulocytes: 0.01 10*3/uL (ref 0.00–0.07)
Basophils Absolute: 0 10*3/uL (ref 0.0–0.1)
Basophils Relative: 1 %
Eosinophils Absolute: 0.2 10*3/uL (ref 0.0–0.5)
Eosinophils Relative: 4 %
HCT: 37.5 % — ABNORMAL LOW (ref 39.0–52.0)
Hemoglobin: 13.4 g/dL (ref 13.0–17.0)
Immature Granulocytes: 0 %
Lymphocytes Relative: 29 %
Lymphs Abs: 1.9 10*3/uL (ref 0.7–4.0)
MCH: 30.7 pg (ref 26.0–34.0)
MCHC: 35.7 g/dL (ref 30.0–36.0)
MCV: 86 fL (ref 80.0–100.0)
Monocytes Absolute: 1 10*3/uL (ref 0.1–1.0)
Monocytes Relative: 15 %
Neutro Abs: 3.5 10*3/uL (ref 1.7–7.7)
Neutrophils Relative %: 51 %
Platelet Count: 130 10*3/uL — ABNORMAL LOW (ref 150–400)
RBC: 4.36 MIL/uL (ref 4.22–5.81)
RDW: 11.8 % (ref 11.5–15.5)
Smear Review: NORMAL
WBC Count: 6.6 10*3/uL (ref 4.0–10.5)
nRBC: 0 % (ref 0.0–0.2)

## 2021-09-09 LAB — CMP (CANCER CENTER ONLY)
ALT: 50 U/L — ABNORMAL HIGH (ref 0–44)
AST: 53 U/L — ABNORMAL HIGH (ref 15–41)
Albumin: 3.7 g/dL (ref 3.5–5.0)
Alkaline Phosphatase: 58 U/L (ref 38–126)
Anion gap: 9 (ref 5–15)
BUN: 33 mg/dL — ABNORMAL HIGH (ref 8–23)
CO2: 28 mmol/L (ref 22–32)
Calcium: 9.7 mg/dL (ref 8.9–10.3)
Chloride: 101 mmol/L (ref 98–111)
Creatinine: 0.86 mg/dL (ref 0.61–1.24)
GFR, Estimated: >60 mL/min (ref >60)
Glucose, Bld: 104 mg/dL — ABNORMAL HIGH (ref 70–99)
Potassium: 3.2 mmol/L — ABNORMAL LOW (ref 3.5–5.1)
Sodium: 138 mmol/L (ref 135–145)
Total Bilirubin: 2.2 mg/dL — ABNORMAL HIGH (ref 0.3–1.2)
Total Protein: 6.5 g/dL (ref 6.5–8.1)

## 2021-09-09 MED ORDER — PROCHLORPERAZINE MALEATE 10 MG PO TABS: 10.0000 mg | ORAL_TABLET | Freq: Four times a day (QID) | ORAL | 0 refills | Status: AC | PRN

## 2021-09-09 MED ORDER — LIDOCAINE-PRILOCAINE 2.5-2.5 % EX CREA: TOPICAL_CREAM | CUTANEOUS | 0 refills | Status: AC

## 2021-09-09 NOTE — Telephone Encounter (Signed)
-----   Message from Curt Bears, MD sent at 09/09/2021 11:07 AM EST ----- Please encourage the patient to increase his potassium rich diet. ----- Message ----- From: Buel Ream, [facility] [place] Sent: 09/09/2021  10:33 AM EST To: Curt Bears, MD

## 2021-09-09 NOTE — Telephone Encounter (Signed)
I spoke with pts wife, Quita Skye, and advised as indicated. She expressed understanding of this information and states he will be sure to eat more potassium rich foods moving forward. Examples of these foods were also reviewed with her.

## 2021-09-09 NOTE — Progress Notes (Signed)
Oncology Nurse Navigator Documentation  Oncology Nurse Navigator Flowsheets 09/09/2021 09/09/2021  Abnormal Finding Date - 06/11/2021  Confirmed Diagnosis Date - 08/30/2021  Diagnosis Status - Confirmed Diagnosis Complete  Planned Course of Treatment - Chemotherapy;Targeted Therapy  Phase of Treatment - Targeted Therapy  Navigator Follow Up Date: - 09/13/2021  Navigator Follow Up Reason: - Appointment Review  [facility]  Navigator Encounter Type Other: Clinic/MDC;Initial MedOnc  Patient Visit Type - Initial;MedOnc  Treatment Phase - Pre-Tx/Tx Discussion  Barriers/Navigation Needs Coordination of Care Coordination of Care;Education  Education - Other;Smoking cessation;Newly Diagnosed Cancer Education;Understanding Cancer/ Treatment Options  Interventions Coordination of Care/[facility]. Wait for responds.  Coordination of Care;Education;Psycho-Social Support  Acuity Level 2-Minimal Needs (1-2 Barriers Identified) Level 2-Minimal Needs (1-2 Barriers Identified)  Coordination of Care Other Other  Education Method - Verbal;Written  Time Spent with Patient 30 30

## 2021-09-09 NOTE — Progress Notes (Signed)
START ON PATHWAY REGIMEN - Small Cell Lung     Cycles 1 through 4: A cycle is every 21 days:     Durvalumab      Carboplatin      Etoposide    Cycles 5 and beyond: A cycle is every 28 days:     Durvalumab   **Always confirm dose/schedule in your pharmacy ordering system**  Patient Characteristics: Newly Diagnosed, Preoperative or Nonsurgical Candidate (Clinical Staging), First Line, Extensive Stage Therapeutic Status: Newly Diagnosed, Preoperative or Nonsurgical Candidate (Clinical Staging) AJCC T Category: cT1b AJCC N Category: cN3 AJCC M Category: cM1a AJCC 8 Stage Grouping: IVA Stage Classification: Extensive  Intent of Therapy: Non-Curative / Palliative Intent, Discussed with Patient

## 2021-09-09 NOTE — Progress Notes (Signed)
[facility] [phone]   [phone]  CONSULT NOTE  REFERRING PHYSICIAN: Dr. Leory Plowman Icard  REASON FOR CONSULTATION:  75 years old white male recently diagnosed with lung cancer.  HPI Austin Wells is a 75 y.o. male with past medical history significant for hypertension, dyslipidemia, sleep apnea, GERD, bipolar disorder as well as long history of smoking.  The patient was followed by the [facility] facility in [facility].  He had CT screening of the chest on April 13, 2021 and that showed new 0.6 x 1.5 x 1.3 cm subpleural left lower lobe solid nodule.  There was small interval growth in left lower lobe.  Diaphragmatic 0.6 x 0.7 nodule and subpleural anterior right upper lobe subpleural nodule.  He had a scan on June 10, 2021 and it showed hypermetabolic lymph node in the left hilum, AP window and mildly hypermetabolic nodal tissue along the right paratracheal chain and subcarinal region.  There was also right upper lobe nodule along the pleural surface with marked increased metabolic activity and maximum SUV of 10.1 and the nodule measures 0.8 x 0.5 cm.  There was also a left lower lobe pleural-based lesion measuring 2.0 x 1.0 cm with maximum SUV of 6.7.  Scan showed also left hilar lymph node measuring 0.9 cm with SUV of 8.4 and the small AP window lymph node measuring 1.1 cm with SUV max of 5.1.  There was also right paratracheal and subcarinal lymph nodes that were hypermetabolic.  There was no evidence of metastatic disease in the abdomen or pelvis.  On 07/15/2021 the patient underwent CT super D of the chest and it confirms the previous results.  On August 30, 2021 the patient underwent flexible video fiberoptic bronchoscopy with robotic assistance and biopsies under the care of Dr. Valeta Harms.  The final pathology ([id number]) from the fine-needle aspiration of 10 L was consistent with small cell carcinoma. Dr. Valeta Harms kindly referred the patient to me  today for evaluation and recommendation regarding treatment of his condition. The patient mentions that he had MRI of the brain at the [place] facility in [place] and it was reported to be negative. When seen today he is feeling fine except for the shortness of breath at baseline increased with exertion but no significant chest pain, cough or hemoptysis.  He lost around 20 pounds in the last 3 months.  He denied having any nausea, vomiting, diarrhea or constipation.  He denied having any headache or visual changes.  He has no fever or chills. Family history significant for mother still alive and had cancer of unknown type.  Father died from COPD. The patient is married and has 2 sons from previous marriage.  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  He was accompanied by his wife Austin Wells.  He has a history for smoking 1 pack/day for around 52 years and unfortunately he continues to smoke.  He has no history of alcohol or drug abuse.  HPI  Past Medical History:  Diagnosis Date   Bipolar disorder ([place])    Depression    GERD (gastroesophageal reflux disease)    Hyperlipidemia    Hypertension    Orthostatic hypotension    Sleep apnea    has a cpap and doesn't use it   Syncope and collapse    Tobacco use     Past Surgical History:  Procedure Laterality Date   BRONCHIAL BIOPSY  08/30/2021   Procedure: BRONCHIAL BIOPSIES;  Surgeon: Garner Nash, DO;  Location:  [place] [facility];  Service: Pulmonary;;   BRONCHIAL BRUSHINGS  08/30/2021   Procedure: BRONCHIAL BRUSHINGS;  Surgeon: Garner Nash, DO;  Location: [place] [facility];  Service: Pulmonary;;   BRONCHIAL NEEDLE ASPIRATION BIOPSY  08/30/2021   Procedure: BRONCHIAL NEEDLE ASPIRATION BIOPSIES;  Surgeon: Garner Nash, DO;  Location: [facility];  Service: Pulmonary;;   BRONCHIAL WASHINGS  08/30/2021   Procedure: BRONCHIAL WASHINGS;  Surgeon: Garner Nash, DO;  Location: [place] [facility];  Service: Pulmonary;;   COLONOSCOPY     FINE NEEDLE  ASPIRATION  08/30/2021   Procedure: FINE NEEDLE ASPIRATION (FNA) LINEAR;  Surgeon: Garner Nash, DO;  Location: [facility];  Service: Pulmonary;;   HERNIA REPAIR     inquinal   VIDEO BRONCHOSCOPY WITH ENDOBRONCHIAL NAVIGATION Left 08/30/2021   Procedure: VIDEO BRONCHOSCOPY WITH ENDOBRONCHIAL NAVIGATION;  Surgeon: Garner Nash, DO;  Location: [place];  Service: Pulmonary;  Laterality: Left;  ION w/ CIOS   VIDEO BRONCHOSCOPY WITH ENDOBRONCHIAL ULTRASOUND Left 08/30/2021   Procedure: VIDEO BRONCHOSCOPY WITH ENDOBRONCHIAL ULTRASOUND;  Surgeon: Garner Nash, DO;  Location: [facility];  Service: Pulmonary;  Laterality: Left;   VIDEO BRONCHOSCOPY WITH RADIAL ENDOBRONCHIAL ULTRASOUND  08/30/2021   Procedure: RADIAL ENDOBRONCHIAL ULTRASOUND;  Surgeon: Garner Nash, DO;  Location: [place] [facility];  Service: Pulmonary;;    No family history on file.  Social History Social History   Tobacco Use   Smoking status: Every Day    Packs/day: 2.00    Years: 55.00    Pack years: 110.00    Types: Cigarettes    Start date: 1967   Smokeless tobacco: Never   Tobacco comments:    Currently smoking 1.5ppd as of 10/28/22ep  Substance Use Topics   Alcohol use: Yes    Comment: very rare   Drug use: Yes    Types: "Crack" cocaine    Comment: Patient stated he last used "last week" (week of Aug 23, 2021)    No Known Allergies  Current Outpatient Medications  Medication Sig Dispense Refill   ARIPiprazole (ABILIFY) 5 MG tablet Take 5 mg by mouth daily. For mood stabilization and depression     calcium-vitamin D (OSCAL WITH D) 500-200 MG-UNIT tablet Take 1 tablet by mouth daily.     cyanocobalamin 500 MCG tablet Take 500 mcg by mouth daily. Vitamin B12     hydrochlorothiazide (HYDRODIURIL) 25 MG tablet Take 12.5 mg by mouth daily.     lisinopril (PRINIVIL,ZESTRIL) 40 MG tablet Take 1 tablet (40 mg total) by mouth daily. For hypertension. (Patient taking differently: Take 40 mg by mouth  daily. For blood pressure)     MELATONIN PO Take 1 tablet by mouth at bedtime.     propranolol (INDERAL) 10 MG tablet Take 10 mg by mouth 2 (two) times daily.     rosuvastatin (CRESTOR) 40 MG tablet Take 40 mg by mouth daily.     Tiotropium Bromide Monohydrate (SPIRIVA RESPIMAT) 2.5 MCG/ACT AERS Inhale 2 puffs into the lungs daily. 4 g 5   Tiotropium Bromide Monohydrate (SPIRIVA RESPIMAT) 2.5 MCG/ACT AERS Inhale 2 puffs into the lungs daily. 4 g 0   traZODone (DESYREL) 150 MG tablet Take 150 mg by mouth at bedtime as needed for sleep.     venlafaxine XR (EFFEXOR-XR) 75 MG 24 hr capsule Take 75 mg by mouth daily with breakfast.     No current facility-administered medications for this visit.    Review of Systems  Constitutional: positive for fatigue and weight loss Eyes:  negative Ears, nose, mouth, throat, and face: negative Respiratory: positive for cough and dyspnea on exertion Cardiovascular: negative Gastrointestinal: negative Genitourinary:negative Integument/breast: negative Hematologic/lymphatic: negative Musculoskeletal:negative Neurological: negative Behavioral/Psych: negative Endocrine: negative Allergic/Immunologic: negative  Physical Exam  ERQ:SXQKS, healthy, no distress, well nourished, well developed, and anxious SKIN: skin color, texture, turgor are normal, no rashes or significant lesions HEAD: Normocephalic, No masses, lesions, tenderness or abnormalities EYES: normal, PERRLA, Conjunctiva are pink and non-injected EARS: External ears normal, Canals clear OROPHARYNX:no exudate, no erythema, and lips, buccal mucosa, and tongue normal  NECK: supple, no adenopathy, no JVD LYMPH:  no palpable lymphadenopathy, no hepatosplenomegaly LUNGS: clear to auscultation , and palpation HEART: regular rate & rhythm, no murmurs, and no gallops ABDOMEN:abdomen soft, non-tender, normal bowel sounds, and no masses or organomegaly BACK: Back symmetric, no curvature., No CVA  tenderness EXTREMITIES:no joint deformities, effusion, or inflammation, no edema  NEURO: alert & oriented x 3 with fluent speech, no focal motor/sensory deficits  PERFORMANCE STATUS: ECOG 1  LABORATORY DATA: Lab Results  Component Value Date   WBC 6.1 08/30/2021   HGB 14.1 08/30/2021   HCT 39.9 08/30/2021   MCV 89.5 08/30/2021   PLT 134 (L) 08/30/2021      Chemistry      Component Value Date/Time   NA 136 08/30/2021 0714   K 3.9 08/30/2021 0714   CL 102 08/30/2021 0714   CO2 26 08/30/2021 0714   BUN 17 08/30/2021 0714   CREATININE 0.83 08/30/2021 0714      Component Value Date/Time   CALCIUM 9.7 08/30/2021 0714   ALKPHOS 45 08/30/2021 0714   AST 25 08/30/2021 0714   ALT 30 08/30/2021 0714   BILITOT 1.1 08/30/2021 0714       RADIOGRAPHIC STUDIES: DG CHEST PORT 1 VIEW  Result Date: 08/30/2021 CLINICAL DATA:  Status post bronchoscopy. EXAM: PORTABLE CHEST 1 VIEW COMPARISON:  CT chest dated July 15, 2021. Chest x-ray dated February 03, 2017. FINDINGS: Normal heart size. Mild peripheral and basilar predominant interstitial thickening is similar to recent CT, allowing for differences in technique, but has progressed since chest x-ray from April 2018. No focal consolidation, pleural effusion, or pneumothorax. No acute osseous abnormality. IMPRESSION: 1. No pneumothorax. 2. Unchanged interstitial lung disease. Electronically Signed   By: Titus Dubin M.D.   On: 08/30/2021 10:27   DG C-ARM BRONCHOSCOPY  Result Date: 08/30/2021 C-ARM BRONCHOSCOPY: Fluoroscopy was utilized by the requesting physician.  No radiographic interpretation.    ASSESSMENT: This is a very pleasant 75 years old white male recently diagnosed with extensive stage (T1b, N3, M1 a) small cell lung cancer presented with right upper lobe lung nodule in addition to multiple left lung nodule as well as bilateral hilar and mediastinal lymphadenopathy diagnosed in November 2022.   PLAN: I had a lengthy  discussion with the patient and his wife today about his current disease stage, prognosis and treatment options.  I personally and independently reviewed the scan images and discussed the results and showed the images to the patient and his wife. The patient mentions that he had MRI of the brain at the [place] facility and that was reported to be negative.  We will try to get records of his MRI. I explained to the patient that he has incurable condition and all the treatment options will be of palliative nature. I gave the patient the option of palliative care and hospice referral versus palliative systemic chemotherapy with carboplatin for AUC of 5 on day 1 and  etoposide 100 Mg/M2 on days 1, 2 and 3 with Neulasta support for Myeloprotection before the chemotherapy as well as Imfinzi 1500 Mg IV every 3 weeks during the chemotherapy course followed by maintenance Imfinzi every 4 weeks if the patient has no evidence for disease progression after the induction phase. The patient is interested in proceeding with the systemic chemotherapy.  I discussed with him the adverse effect of this treatment including but not limited to alopecia, myelosuppression, nausea and vomiting, peripheral neuropathy, liver or renal dysfunction as well as immunotherapy adverse effects. He is expected to start the first cycle of this treatment the week after Thanksgiving. I will arrange for the patient to have a chemotherapy education class before the first dose of his treatment. I will also arrange for the patient to have a Port-A-Cath placed before treatment. He will come back for follow-up visit 1 week after the treatment for evaluation and management of any adverse effect of his treatment. I will call his pharmacy with prescription for Compazine 10 mg p.o. every 6 hours as needed for nausea in addition to Emla cream to be applied to the Port-A-Cath site before treatment. The patient was advised to call immediately if he has any other  concerning symptoms in the interval. The patient voices understanding of current disease status and treatment options and is in agreement with the current care plan.  All questions were answered. The patient knows to call the clinic with any problems, questions or concerns. We can certainly see the patient much sooner if necessary.  Thank you so much for allowing me to participate in the care of [facility]. I will continue to follow up the patient with you and assist in his care.  The total time spent in the appointment was 90 minutes.  Disclaimer: This note was dictated with voice recognition software. Similar sounding words can inadvertently be transcribed and may not be corrected upon review.   Eilleen Kempf September 09, 2021, 9:54 AM

## 2021-09-09 NOTE — Progress Notes (Signed)
Oncology Nurse Navigator Documentation  Oncology Nurse Navigator Flowsheets 09/09/2021  Abnormal Finding Date 06/11/2021  Confirmed Diagnosis Date 08/30/2021  Diagnosis Status Confirmed Diagnosis Complete  Planned Course of Treatment Chemotherapy;Targeted Therapy  Phase of Treatment Targeted Therapy  Navigator Follow Up Date: 09/13/2021  Navigator Follow Up Reason: Appointment Review  Navigator Location [facility]  Navigator Encounter Type Clinic/MDC;Initial MedOnc/I spoke to patient and his girlfriend today at his first appt to see Dr. Julien Nordmann. He is newly dx small cell lung cancer. I gave and explained information on his treatment plan and smoking cessation.  Patient had  brain scan at [facility]. I will follow up with them on this scan.    Patient Visit Type Initial;MedOnc  Treatment Phase Pre-Tx/Tx Discussion  Barriers/Navigation Needs Coordination of Care;Education  Education Other;Smoking cessation;Newly Diagnosed Cancer Education;Understanding Cancer/ Treatment Options  Interventions Coordination of Care;Education;Psycho-Social Support  Acuity Level 2-Minimal Needs (1-2 Barriers Identified)  Coordination of Care Other  Education Method Verbal;Written  Time Spent with Patient 30

## 2021-09-12 ENCOUNTER — Telehealth: Payer: Self-pay | Admitting: Medical Oncology

## 2021-09-12 ENCOUNTER — Encounter: Payer: Self-pay | Admitting: *Deleted

## 2021-09-12 NOTE — Progress Notes (Signed)
Oncology Nurse Navigator Documentation  Oncology Nurse Navigator Flowsheets 09/12/2021 09/09/2021 09/09/2021  Abnormal Finding Date - - 06/11/2021  Confirmed Diagnosis Date - - 08/30/2021  Diagnosis Status - - Confirmed Diagnosis Complete  Planned Course of Treatment - - Chemotherapy;Targeted Therapy  Phase of Treatment Targeted Therapy - Targeted Therapy  Navigator Follow Up Date: 09/13/2021 - 09/13/2021  Navigator Follow Up Reason: Appointment Review - Appointment Review  Navigator Location [facility]  Navigator Encounter Type Other: Other: Clinic/MDC;Initial MedOnc  Patient Visit Type - - Initial;MedOnc  Treatment Phase - - Pre-Tx/Tx Discussion  Barriers/Navigation Needs Coordination of Care/I followed up on Austin Wells schedule and noticed his chemo schedule is not scheduled. I will reach out to [facility] scheduling team.  I also follow up on my request with the [facility] on patient's brain scan.  I have not been updated yet so I reached back out to the [place] for an update.  Coordination of Care Coordination of Care;Education  Education - - Other;Smoking cessation;Newly Diagnosed Cancer Education;Understanding Cancer/ Treatment Options  Interventions Coordination of Care Coordination of Care Coordination of Care;Education;Psycho-Social Support  Acuity Level 2-Minimal Needs (1-2 Barriers Identified) Level 2-Minimal Needs (1-2 Barriers Identified) Level 2-Minimal Needs (1-2 Barriers Identified)  Coordination of Care Appts Other Other  Education Method - - Verbal;Written  Time Spent with Patient 54 56 25

## 2021-09-12 NOTE — Telephone Encounter (Signed)
Wife answered my call and said she is driving and does not want to talk on the phone. I told her I left her a message.

## 2021-09-12 NOTE — Telephone Encounter (Signed)
Chemotherapy start date Message left with wife to ask pt if he will start his chemo next Tuesday 11/29 with an peripheral IV .  On  wed get his port inserted  and his tx  after port . Day 3 his chemo will be given through his port.

## 2021-09-13 ENCOUNTER — Encounter: Payer: Self-pay | Admitting: Internal Medicine

## 2021-09-13 ENCOUNTER — Telehealth: Payer: Self-pay | Admitting: Medical Oncology

## 2021-09-13 ENCOUNTER — Inpatient Hospital Stay: Payer: No Typology Code available for payment source

## 2021-09-13 ENCOUNTER — Other Ambulatory Visit: Payer: Self-pay

## 2021-09-13 ENCOUNTER — Telehealth: Payer: Self-pay

## 2021-09-13 NOTE — Telephone Encounter (Signed)
I called cell and home number re chemo on 11/29-no answer.

## 2021-09-13 NOTE — Telephone Encounter (Signed)
I spoke with pts wife regarding the change needed with the date of pts port placement as it was interfering with his three day treatment.  I advised the wife his new date for port placement is 09/26/21 with a 12:30pm arrival time to the Admitting Department. She expressed understanding of this information.

## 2021-09-13 NOTE — Progress Notes (Signed)
Met with patient/accompanying adult at registration to introduce myself as Financial Resource Specialist and to offer available resources.  Discussed one-time $1000 [employer] grant and qualifications to assist with personal expenses while going through treatment.  Gave him my card if interested in applying and for any additional financial questions or concerns. 

## 2021-09-14 ENCOUNTER — Encounter: Payer: Self-pay | Admitting: *Deleted

## 2021-09-14 NOTE — Progress Notes (Signed)
Oncology Nurse Navigator Documentation  Oncology Nurse Navigator Flowsheets 09/14/2021 09/12/2021 09/09/2021 09/09/2021  Abnormal Finding Date - - - 06/11/2021  Confirmed Diagnosis Date - - - 08/30/2021  Diagnosis Status - - - Confirmed Diagnosis Complete  Planned Course of Treatment - - - Chemotherapy;Targeted Therapy  Phase of Treatment Targeted Therapy Targeted Therapy - Targeted Therapy  Chemotherapy Actual Start Date: 09/20/2021 - - -  Targeted Therapy Actual Start Date: 09/20/2021 - - -  Navigator Follow Up Date: 09/27/2021 09/13/2021 - 09/13/2021  Navigator Follow Up Reason: Follow-up Appointment Appointment Review - Appointment Review  Nav[facility]  Navigator Encounter Type Other: Other: Other: Clinic/MDC;Initial MedOnc  Treatment Initiated Date 09/20/2021 - - -  Patient Visit Type Other - - Initial;MedOnc  Treatment Phase - - - Pre-Tx/Tx Discussion  Barriers/Navigation Needs Coordination of Care/I called [facility] to see if they have received the CD of Austin Wells MRI brain from 10/19.  They have and it is uploaded to PACS.  I did contacted the [place] to request a hard copy of MRI brain to be faxed to [phone].   Coordination of Care Coordination of Care Coordination of Care;Education  Education - - - Other;Smoking cessation;Newly Diagnosed Cancer Education;Understanding Cancer/ Treatment Options  Interventions Coordination of Care Coordination of Care Coordination of Care Coordination of Care;Education;Psycho-Social Support  Acuity Level 2-Minimal Needs (1-2 Barriers Identified) Level 2-Minimal Needs (1-2 Barriers Identified) Level 2-Minimal Needs (1-2 Barriers Identified) Level 2-Minimal Needs (1-2 Barriers Identified)  Coordination of Care Radiology;Other Appts Other Other  Education Method - - - Verbal;Written  Time Spent with Patient 30 30 30  30

## 2021-09-19 ENCOUNTER — Encounter: Payer: Self-pay | Admitting: *Deleted

## 2021-09-19 ENCOUNTER — Other Ambulatory Visit: Payer: Self-pay | Admitting: Physician Assistant

## 2021-09-19 DIAGNOSIS — C3481 Malignant neoplasm of overlapping sites of right bronchus and lung: Secondary | ICD-10-CM

## 2021-09-19 NOTE — Progress Notes (Signed)
Oncology Nurse Navigator Documentation  Oncology Nurse Navigator Flowsheets 09/19/2021 09/14/2021 09/12/2021 09/09/2021 09/09/2021  Abnormal Finding Date - - - - 06/11/2021  Confirmed Diagnosis Date - - - - 08/30/2021  Diagnosis Status - - - - Confirmed Diagnosis Complete  Planned Course of Treatment - - - - Chemotherapy;Targeted Therapy  Phase of Treatment - Targeted Therapy Targeted Therapy - Targeted Therapy  Chemotherapy Actual Start Date: - 09/20/2021 - - -  Targeted Therapy Actual Start Date: - 09/20/2021 - - -  Navigator Follow Up Date: - 09/27/2021 09/13/2021 - 09/13/2021  Navigator Follow Up Reason: - Follow-up Appointment Appointment Review - Appointment Review  Navigator Location [facility]  Navigator Encounter Type Telephone Other: Other: Other: Clinic/MDC;Initial MedOnc  Telephone Outgoing Call - - - -  Treatment Initiated Date - 09/20/2021 - - -  Patient Visit Type - Other - - Initial;MedOnc  Treatment Phase Pre-Tx/Tx Discussion - - - Pre-Tx/Tx Discussion  Barriers/Navigation Needs Coordination of Care;Education/I received a message from [facility] that patient has been referred from the [place] to see them. I called patient and his girlfriend answered. She states that they would like treatment at [facility].  I will notify [facility] of his request.  Coordination of Care Coordination of Care Coordination of Care Coordination of Care;Education  Education Other - - - Other;Smoking cessation;Newly Diagnosed Cancer Education;Understanding Cancer/ Treatment Options  Interventions Coordination of Care;Psycho-Social Support;Education Coordination of Care Coordination of Care Coordination of Care Coordination of Care;Education;Psycho-Social Support  Acuity Level 2-Minimal Needs (1-2 Barriers Identified) Level 2-Minimal Needs (1-2 Barriers Identified) Level 2-Minimal Needs (1-2 Barriers Identified) Level 2-Minimal Needs (1-2 Barriers  Identified) Level 2-Minimal Needs (1-2 Barriers Identified)  Coordination of Care Other Radiology;Other Appts Other Other  Education Method Other - - - Verbal;Written  Time Spent with Patient 60 30 30 30  30

## 2021-09-20 ENCOUNTER — Inpatient Hospital Stay: Payer: No Typology Code available for payment source

## 2021-09-20 ENCOUNTER — Other Ambulatory Visit: Payer: Self-pay

## 2021-09-20 VITALS — BP 117/79 | HR 61 | Temp 97.9°F | Resp 18 | Ht 71.0 in | Wt 180.0 lb

## 2021-09-20 DIAGNOSIS — C3481 Malignant neoplasm of overlapping sites of right bronchus and lung: Secondary | ICD-10-CM

## 2021-09-20 DIAGNOSIS — Z5112 Encounter for antineoplastic immunotherapy: Secondary | ICD-10-CM | POA: Diagnosis not present

## 2021-09-20 LAB — CBC WITH DIFFERENTIAL (CANCER CENTER ONLY)
Abs Immature Granulocytes: 0.01 10*3/uL (ref 0.00–0.07)
Basophils Absolute: 0 10*3/uL (ref 0.0–0.1)
Basophils Relative: 0 %
Eosinophils Absolute: 0.1 10*3/uL (ref 0.0–0.5)
Eosinophils Relative: 2 %
HCT: 38.5 % — ABNORMAL LOW (ref 39.0–52.0)
Hemoglobin: 14.1 g/dL (ref 13.0–17.0)
Immature Granulocytes: 0 %
Lymphocytes Relative: 25 %
Lymphs Abs: 1.5 10*3/uL (ref 0.7–4.0)
MCH: 31.3 pg (ref 26.0–34.0)
MCHC: 36.6 g/dL — ABNORMAL HIGH (ref 30.0–36.0)
MCV: 85.4 fL (ref 80.0–100.0)
Monocytes Absolute: 0.6 10*3/uL (ref 0.1–1.0)
Monocytes Relative: 11 %
Neutro Abs: 3.6 10*3/uL (ref 1.7–7.7)
Neutrophils Relative %: 62 %
Platelet Count: 126 10*3/uL — ABNORMAL LOW (ref 150–400)
RBC: 4.51 MIL/uL (ref 4.22–5.81)
RDW: 11.7 % (ref 11.5–15.5)
WBC Count: 5.9 10*3/uL (ref 4.0–10.5)
nRBC: 0 % (ref 0.0–0.2)

## 2021-09-20 LAB — CMP (CANCER CENTER ONLY)
ALT: 25 U/L (ref 0–44)
AST: 21 U/L (ref 15–41)
Albumin: 3.5 g/dL (ref 3.5–5.0)
Alkaline Phosphatase: 59 U/L (ref 38–126)
Anion gap: 6 (ref 5–15)
BUN: 12 mg/dL (ref 8–23)
CO2: 26 mmol/L (ref 22–32)
Calcium: 9.5 mg/dL (ref 8.9–10.3)
Chloride: 105 mmol/L (ref 98–111)
Creatinine: 0.63 mg/dL (ref 0.61–1.24)
GFR, Estimated: >60 mL/min (ref >60)
Glucose, Bld: 152 mg/dL — ABNORMAL HIGH (ref 70–99)
Potassium: 3.8 mmol/L (ref 3.5–5.1)
Sodium: 137 mmol/L (ref 135–145)
Total Bilirubin: 0.7 mg/dL (ref 0.3–1.2)
Total Protein: 7 g/dL (ref 6.5–8.1)

## 2021-09-20 LAB — TSH: TSH: <0.01 u[IU]/mL — ABNORMAL LOW (ref 0.350–4.500)

## 2021-09-20 MED ORDER — SODIUM CHLORIDE 0.9 % IV SOLN
495.0000 mg | Freq: Once | INTRAVENOUS | Status: AC
  Administered 2021-09-20: 500 mg via INTRAVENOUS
  Filled 2021-09-20: qty 50

## 2021-09-20 MED ORDER — PALONOSETRON HCL INJECTION 0.25 MG/5ML
0.2500 mg | Freq: Once | INTRAVENOUS | Status: AC
  Administered 2021-09-20: 0.25 mg via INTRAVENOUS

## 2021-09-20 MED ORDER — SODIUM CHLORIDE 0.9 % IV SOLN: Freq: Once | INTRAVENOUS | Status: AC

## 2021-09-20 MED ORDER — TRILACICLIB DIHYDROCHLORIDE INJECTION 300 MG
240.0000 mg/m2 | Freq: Once | INTRAVENOUS | Status: AC
  Administered 2021-09-20: 480 mg via INTRAVENOUS
  Filled 2021-09-20: qty 32

## 2021-09-20 MED ORDER — SODIUM CHLORIDE 0.9 % IV SOLN
100.0000 mg/m2 | Freq: Once | INTRAVENOUS | Status: AC
  Administered 2021-09-20: 200 mg via INTRAVENOUS
  Filled 2021-09-20: qty 10

## 2021-09-20 MED ORDER — SODIUM CHLORIDE 0.9 % IV SOLN
1500.0000 mg | Freq: Once | INTRAVENOUS | Status: AC
  Administered 2021-09-20: 1500 mg via INTRAVENOUS
  Filled 2021-09-20: qty 30

## 2021-09-20 MED ORDER — SODIUM CHLORIDE 0.9 % IV SOLN
10.0000 mg | Freq: Once | INTRAVENOUS | Status: AC
  Administered 2021-09-20: 10 mg via INTRAVENOUS
  Filled 2021-09-20: qty 10

## 2021-09-20 MED ORDER — SODIUM CHLORIDE 0.9 % IV SOLN
150.0000 mg | Freq: Once | INTRAVENOUS | Status: AC
  Administered 2021-09-20: 150 mg via INTRAVENOUS
  Filled 2021-09-20: qty 150

## 2021-09-20 MED FILL — Dexamethasone Sodium Phosphate Inj 100 MG/10ML: INTRAMUSCULAR | Qty: 1 | Status: AC

## 2021-09-20 NOTE — Patient Instructions (Addendum)
[facility]  Discharge Instructions: Thank you for choosing [facility] to provide your oncology and hematology care.   If you have a lab appointment with the [facility], please go directly to the [facility] and check in at the registration area.   Wear comfortable clothing and clothing appropriate for easy access to any Portacath or PICC line.   We strive to give you quality time with your provider. You may need to reschedule your appointment if you arrive late (15 or more minutes).  Arriving late affects you and other patients whose appointments are after yours.  Also, if you miss three or more appointments without notifying the office, you may be dismissed from the clinic at the provider's discretion.      For prescription refill requests, have your pharmacy contact our office and allow 72 hours for refills to be completed.    Today you received the following chemotherapy and/or immunotherapy agents Cosela, Durvalumab, Etoposide, and Carboplatin    To help prevent nausea and vomiting after your treatment, we encourage you to take your nausea medication as directed.  BELOW ARE SYMPTOMS THAT SHOULD BE REPORTED IMMEDIATELY: *FEVER GREATER THAN 100.4 F (38 C) OR HIGHER *CHILLS OR SWEATING *NAUSEA AND VOMITING THAT IS NOT CONTROLLED WITH YOUR NAUSEA MEDICATION *UNUSUAL SHORTNESS OF BREATH *UNUSUAL BRUISING OR BLEEDING *URINARY PROBLEMS (pain or burning when urinating, or frequent urination) *BOWEL PROBLEMS (unusual diarrhea, constipation, pain near the anus) TENDERNESS IN MOUTH AND THROAT WITH OR WITHOUT PRESENCE OF ULCERS (sore throat, sores in mouth, or a toothache) UNUSUAL RASH, SWELLING OR PAIN  UNUSUAL VAGINAL DISCHARGE OR ITCHING   Items with * indicate a potential emergency and should be followed up as soon as possible or go to the Emergency Department if any problems should occur.  Please show the CHEMOTHERAPY ALERT CARD or  IMMUNOTHERAPY ALERT CARD at check-in to the Emergency Department and triage nurse.  Should you have questions after your visit or need to cancel or reschedule your appointment, please contact [facility]  Dept: [phone]  and follow the prompts.  Office hours are 8:00 a.m. to 4:30 p.m. Monday - Friday. Please note that voicemails left after 4:00 p.m. may not be returned until the following business day.  We are closed weekends and major holidays. You have access to a nurse at all times for urgent questions. Please call the main number to the clinic Dept: [phone] and follow the prompts.   For any non-urgent questions, you may also contact your provider using MyChart. We now offer e-Visits for anyone 75 and older to request care online for non-urgent symptoms. For details visit [email or web address].   Also download the MyChart app! Go to the app store, search "MyChart", open the app, select [facility], and log in with your MyChart username and password.  Due to Covid, a mask is required upon entering the hospital/clinic. If you do not have a mask, one will be given to you upon arrival. For doctor visits, patients may have 1 support person aged 65 or older with them. For treatment visits, patients cannot have anyone with them due to current Covid guidelines and our immunocompromised population.

## 2021-09-21 ENCOUNTER — Other Ambulatory Visit (HOSPITAL_COMMUNITY): Payer: No Typology Code available for payment source

## 2021-09-21 ENCOUNTER — Inpatient Hospital Stay: Payer: No Typology Code available for payment source

## 2021-09-21 ENCOUNTER — Ambulatory Visit (HOSPITAL_COMMUNITY): Payer: No Typology Code available for payment source

## 2021-09-21 VITALS — BP 131/86 | HR 83 | Temp 98.4°F | Resp 18

## 2021-09-21 DIAGNOSIS — Z5112 Encounter for antineoplastic immunotherapy: Secondary | ICD-10-CM | POA: Diagnosis not present

## 2021-09-21 DIAGNOSIS — C3481 Malignant neoplasm of overlapping sites of right bronchus and lung: Secondary | ICD-10-CM

## 2021-09-21 MED ORDER — SODIUM CHLORIDE 0.9 % IV SOLN
10.0000 mg | Freq: Once | INTRAVENOUS | Status: AC
  Administered 2021-09-21: 10 mg via INTRAVENOUS
  Filled 2021-09-21: qty 10

## 2021-09-21 MED ORDER — SODIUM CHLORIDE 0.9 % IV SOLN: Freq: Once | INTRAVENOUS | Status: AC

## 2021-09-21 MED ORDER — SODIUM CHLORIDE 0.9 % IV SOLN
100.0000 mg/m2 | Freq: Once | INTRAVENOUS | Status: AC
  Administered 2021-09-21: 200 mg via INTRAVENOUS
  Filled 2021-09-21: qty 10

## 2021-09-21 MED ORDER — TRILACICLIB DIHYDROCHLORIDE INJECTION 300 MG
240.0000 mg/m2 | Freq: Once | INTRAVENOUS | Status: AC
  Administered 2021-09-21: 480 mg via INTRAVENOUS
  Filled 2021-09-21: qty 32

## 2021-09-21 MED FILL — Dexamethasone Sodium Phosphate Inj 100 MG/10ML: INTRAMUSCULAR | Qty: 1 | Status: AC

## 2021-09-21 NOTE — Patient Instructions (Signed)
[facility]  Discharge Instructions: Thank you for choosing [facility] to provide your oncology and hematology care.   If you have a lab appointment with the [facility], please go directly to the [facility] and check in at the registration area.   Wear comfortable clothing and clothing appropriate for easy access to any Portacath or PICC line.   We strive to give you quality time with your provider. You may need to reschedule your appointment if you arrive late (15 or more minutes).  Arriving late affects you and other patients whose appointments are after yours.  Also, if you miss three or more appointments without notifying the office, you may be dismissed from the clinic at the provider's discretion.      For prescription refill requests, have your pharmacy contact our office and allow 72 hours for refills to be completed.    Today you received the following chemotherapy and/or immunotherapy agents Cosela and Etoposide   To help prevent nausea and vomiting after your treatment, we encourage you to take your nausea medication as directed.  BELOW ARE SYMPTOMS THAT SHOULD BE REPORTED IMMEDIATELY: *FEVER GREATER THAN 100.4 F (38 C) OR HIGHER *CHILLS OR SWEATING *NAUSEA AND VOMITING THAT IS NOT CONTROLLED WITH YOUR NAUSEA MEDICATION *UNUSUAL SHORTNESS OF BREATH *UNUSUAL BRUISING OR BLEEDING *URINARY PROBLEMS (pain or burning when urinating, or frequent urination) *BOWEL PROBLEMS (unusual diarrhea, constipation, pain near the anus) TENDERNESS IN MOUTH AND THROAT WITH OR WITHOUT PRESENCE OF ULCERS (sore throat, sores in mouth, or a toothache) UNUSUAL RASH, SWELLING OR PAIN  UNUSUAL VAGINAL DISCHARGE OR ITCHING   Items with * indicate a potential emergency and should be followed up as soon as possible or go to the Emergency Department if any problems should occur.  Please show the CHEMOTHERAPY ALERT CARD or IMMUNOTHERAPY ALERT CARD at  check-in to the Emergency Department and triage nurse.  Should you have questions after your visit or need to cancel or reschedule your appointment, please contact [facility]  Dept: [phone]  and follow the prompts.  Office hours are 8:00 a.m. to 4:30 p.m. Monday - Friday. Please note that voicemails left after 4:00 p.m. may not be returned until the following business day.  We are closed weekends and major holidays. You have access to a nurse at all times for urgent questions. Please call the main number to the clinic Dept: [phone] and follow the prompts.   For any non-urgent questions, you may also contact your provider using MyChart. We now offer e-Visits for anyone 50 and older to request care online for non-urgent symptoms. For details visit [email or web address].   Also download the MyChart app! Go to the app store, search "MyChart", open the app, select [facility], and log in with your MyChart username and password.  Due to Covid, a mask is required upon entering the hospital/clinic. If you do not have a mask, one will be given to you upon arrival. For doctor visits, patients may have 1 support person aged 20 or older with them. For treatment visits, patients cannot have anyone with them due to current Covid guidelines and our immunocompromised population.

## 2021-09-22 ENCOUNTER — Inpatient Hospital Stay: Payer: No Typology Code available for payment source | Attending: Internal Medicine

## 2021-09-22 ENCOUNTER — Other Ambulatory Visit: Payer: Self-pay

## 2021-09-22 VITALS — BP 160/89 | HR 63 | Temp 97.6°F | Resp 20

## 2021-09-22 DIAGNOSIS — C3411 Malignant neoplasm of upper lobe, right bronchus or lung: Secondary | ICD-10-CM | POA: Insufficient documentation

## 2021-09-22 DIAGNOSIS — Z452 Encounter for adjustment and management of vascular access device: Secondary | ICD-10-CM | POA: Diagnosis not present

## 2021-09-22 DIAGNOSIS — Z5112 Encounter for antineoplastic immunotherapy: Secondary | ICD-10-CM | POA: Diagnosis present

## 2021-09-22 DIAGNOSIS — Z5111 Encounter for antineoplastic chemotherapy: Secondary | ICD-10-CM | POA: Diagnosis present

## 2021-09-22 DIAGNOSIS — Z79899 Other long term (current) drug therapy: Secondary | ICD-10-CM | POA: Diagnosis not present

## 2021-09-22 DIAGNOSIS — C3481 Malignant neoplasm of overlapping sites of right bronchus and lung: Secondary | ICD-10-CM

## 2021-09-22 MED ORDER — SODIUM CHLORIDE 0.9 % IV SOLN
10.0000 mg | Freq: Once | INTRAVENOUS | Status: DC
  Filled 2021-09-22: qty 1

## 2021-09-22 MED ORDER — SODIUM CHLORIDE 0.9 % IV SOLN: Freq: Once | INTRAVENOUS | Status: AC

## 2021-09-22 MED ORDER — TRILACICLIB DIHYDROCHLORIDE INJECTION 300 MG
240.0000 mg/m2 | Freq: Once | INTRAVENOUS | Status: AC
  Administered 2021-09-22: 480 mg via INTRAVENOUS
  Filled 2021-09-22: qty 32

## 2021-09-22 MED ORDER — DEXAMETHASONE SODIUM PHOSPHATE 100 MG/10ML IJ SOLN
10.0000 mg | Freq: Once | INTRAMUSCULAR | Status: AC
  Administered 2021-09-22: 10 mg via INTRAVENOUS
  Filled 2021-09-22: qty 10

## 2021-09-22 MED ORDER — SODIUM CHLORIDE 0.9 % IV SOLN
100.0000 mg/m2 | Freq: Once | INTRAVENOUS | Status: AC
  Administered 2021-09-22: 200 mg via INTRAVENOUS
  Filled 2021-09-22: qty 10

## 2021-09-22 NOTE — Patient Instructions (Signed)
[facility]  Discharge Instructions: Thank you for choosing [facility] to provide your oncology and hematology care.   If you have a lab appointment with the Cancer Center, please go directly to the Cancer Center and check in at the registration area.   Wear comfortable clothing and clothing appropriate for easy access to any Portacath or PICC line.   We strive to give you quality time with your provider. You may need to reschedule your appointment if you arrive late (15 or more minutes).  Arriving late affects you and other patients whose appointments are after yours.  Also, if you miss three or more appointments without notifying the office, you may be dismissed from the clinic at the provider's discretion.      For prescription refill requests, have your pharmacy contact our office and allow 72 hours for refills to be completed.    Today you received the following chemotherapy and/or immunotherapy agents: etoposide.     To help prevent nausea and vomiting after your treatment, we encourage you to take your nausea medication as directed.  BELOW ARE SYMPTOMS THAT SHOULD BE REPORTED IMMEDIATELY: . *FEVER GREATER THAN 100.4 F (38 C) OR HIGHER . *CHILLS OR SWEATING . *NAUSEA AND VOMITING THAT IS NOT CONTROLLED WITH YOUR NAUSEA MEDICATION . *UNUSUAL SHORTNESS OF BREATH . *UNUSUAL BRUISING OR BLEEDING . *URINARY PROBLEMS (pain or burning when urinating, or frequent urination) . *BOWEL PROBLEMS (unusual diarrhea, constipation, pain near the anus) . TENDERNESS IN MOUTH AND THROAT WITH OR WITHOUT PRESENCE OF ULCERS (sore throat, sores in mouth, or a toothache) . UNUSUAL RASH, SWELLING OR PAIN  . UNUSUAL VAGINAL DISCHARGE OR ITCHING   Items with * indicate a potential emergency and should be followed up as soon as possible or go to the Emergency Department if any problems should occur.  Please show the CHEMOTHERAPY ALERT CARD or IMMUNOTHERAPY ALERT  CARD at check-in to the Emergency Department and triage nurse.  Should you have questions after your visit or need to cancel or reschedule your appointment, please contact [facility]  Dept: [phone]  and follow the prompts.  Office hours are 8:00 a.m. to 4:30 p.m. Monday - Friday. Please note that voicemails left after 4:00 p.m. may not be returned until the following business day.  We are closed weekends and major holidays. You have access to a nurse at all times for urgent questions. Please call the main number to the clinic Dept: [phone] and follow the prompts.   For any non-urgent questions, you may also contact your provider using MyChart. We now offer e-Visits for anyone 18 and older to request care online for non-urgent symptoms. For details visit [email or web address].   Also download the MyChart app! Go to the app store, search "MyChart", open the app, select [facility], and log in with your MyChart username and password.  Due to Covid, a mask is required upon entering the hospital/clinic. If you do not have a mask, one will be given to you upon arrival. For doctor visits, patients may have 1 support person aged 18 or older with them. For treatment visits, patients cannot have anyone with them due to current Covid guidelines and our immunocompromised population.   

## 2021-09-23 ENCOUNTER — Other Ambulatory Visit: Payer: Self-pay | Admitting: Internal Medicine

## 2021-09-23 ENCOUNTER — Other Ambulatory Visit (HOSPITAL_COMMUNITY): Payer: Self-pay | Admitting: Physician Assistant

## 2021-09-26 ENCOUNTER — Other Ambulatory Visit: Payer: Self-pay

## 2021-09-26 ENCOUNTER — Ambulatory Visit (HOSPITAL_COMMUNITY)
Admission: RE | Admit: 2021-09-26 | Discharge: 2021-09-26 | Disposition: A | Discharge status: Home / Self Care | Payer: No Typology Code available for payment source | Source: Ambulatory Visit | Attending: Internal Medicine | Admitting: Internal Medicine

## 2021-09-26 ENCOUNTER — Encounter (HOSPITAL_COMMUNITY): Payer: Self-pay

## 2021-09-26 ENCOUNTER — Other Ambulatory Visit: Payer: Self-pay | Admitting: Internal Medicine

## 2021-09-26 DIAGNOSIS — C3490 Malignant neoplasm of unspecified part of unspecified bronchus or lung: Secondary | ICD-10-CM | POA: Insufficient documentation

## 2021-09-26 DIAGNOSIS — F319 Bipolar disorder, unspecified: Secondary | ICD-10-CM | POA: Insufficient documentation

## 2021-09-26 DIAGNOSIS — Z87891 Personal history of nicotine dependence: Secondary | ICD-10-CM | POA: Diagnosis not present

## 2021-09-26 DIAGNOSIS — C3481 Malignant neoplasm of overlapping sites of right bronchus and lung: Secondary | ICD-10-CM

## 2021-09-26 DIAGNOSIS — G4730 Sleep apnea, unspecified: Secondary | ICD-10-CM | POA: Diagnosis not present

## 2021-09-26 DIAGNOSIS — I10 Essential (primary) hypertension: Secondary | ICD-10-CM | POA: Insufficient documentation

## 2021-09-26 HISTORY — PX: IR IMAGING GUIDED PORT INSERTION: IMG5740

## 2021-09-26 MED ORDER — LIDOCAINE HCL 1 % IJ SOLN
INTRAMUSCULAR | Status: AC
  Filled 2021-09-26: qty 20

## 2021-09-26 MED ORDER — LIDOCAINE-EPINEPHRINE (PF) 2 %-1:200000 IJ SOLN
INTRAMUSCULAR | Status: DC | PRN
  Administered 2021-09-26: 10 mL via INTRADERMAL

## 2021-09-26 MED ORDER — SODIUM CHLORIDE 0.9 % IV SOLN: INTRAVENOUS | Status: DC

## 2021-09-26 MED ORDER — HEPARIN SOD (PORK) LOCK FLUSH 100 UNIT/ML IV SOLN
INTRAVENOUS | Status: DC | PRN
  Administered 2021-09-26: 500 [IU] via INTRAVENOUS

## 2021-09-26 MED ORDER — LIDOCAINE HCL 1 % IJ SOLN
INTRAMUSCULAR | Status: DC | PRN
  Administered 2021-09-26: 10 mL via INTRADERMAL

## 2021-09-26 MED ORDER — FENTANYL CITRATE (PF) 100 MCG/2ML IJ SOLN
INTRAMUSCULAR | Status: DC | PRN
  Administered 2021-09-26: 50 ug via INTRAVENOUS
  Administered 2021-09-26: 25 ug via INTRAVENOUS

## 2021-09-26 MED ORDER — HEPARIN SOD (PORK) LOCK FLUSH 100 UNIT/ML IV SOLN
INTRAVENOUS | Status: AC
  Filled 2021-09-26: qty 5

## 2021-09-26 MED ORDER — MIDAZOLAM HCL 2 MG/2ML IJ SOLN
INTRAMUSCULAR | Status: DC | PRN
  Administered 2021-09-26 (×2): 1 mg via INTRAVENOUS

## 2021-09-26 MED ORDER — LIDOCAINE-EPINEPHRINE (PF) 2 %-1:200000 IJ SOLN
INTRAMUSCULAR | Status: AC
  Filled 2021-09-26: qty 20

## 2021-09-26 MED ORDER — MIDAZOLAM HCL 2 MG/2ML IJ SOLN
INTRAMUSCULAR | Status: AC
  Filled 2021-09-26: qty 2

## 2021-09-26 MED ORDER — FENTANYL CITRATE (PF) 100 MCG/2ML IJ SOLN
INTRAMUSCULAR | Status: AC
  Filled 2021-09-26: qty 2

## 2021-09-26 NOTE — Procedures (Signed)
Interventional Radiology Procedure:   Indications: Small cell lung cancer  Procedure: Port placement  Findings: Right jugular port, tip at SVC/RA junction  Complications: None     EBL: Minimal, less than 10 ml  Plan: Discharge in one hour.  Keep port site and incisions dry for at least 24 hours.     Austin Wells R. Anselm Pancoast, MD  Pager: [phone]

## 2021-09-26 NOTE — H&P (Signed)
Chief Complaint: Patient was seen in consultation today for port-a-catheter placement  Referring Physician(s): Mohamed,Mohamed  Supervising Physician: Markus Daft  Patient Status: [facility] - Out-pt  History of Present Illness: Austin Wells is a 75 y.o. male with a medical history significant for HTN, sleep apnea, bipolar disorder and a long history of tobacco use. A screening CT chest in July 2022 showed new lung nodules and PET scan August 2022 confirmed multiple right/left lung nodules and enlarged lymph nodes with increased metabolic activity. A bronchoscopy with biopsy November 2022 revealed small cell carcinoma.   Interventional Radiology has been asked to evaluate this patient for an image-guided port-a-catheter placement to facilitate his treatment plans.   Past Medical History:  Diagnosis Date   Bipolar disorder ([facility])    Depression    GERD (gastroesophageal reflux disease)    Hyperlipidemia    Hypertension    Orthostatic hypotension    Sleep apnea    has a cpap and doesn't use it   Syncope and collapse    Tobacco use     Past Surgical History:  Procedure Laterality Date   BRONCHIAL BIOPSY  08/30/2021   Procedure: BRONCHIAL BIOPSIES;  Surgeon: Garner Nash, DO;  Location: [facility];  Service: Pulmonary;;   BRONCHIAL BRUSHINGS  08/30/2021   Procedure: BRONCHIAL BRUSHINGS;  Surgeon: Garner Nash, DO;  Location: [facility];  Service: Pulmonary;;   BRONCHIAL NEEDLE ASPIRATION BIOPSY  08/30/2021   Procedure: BRONCHIAL NEEDLE ASPIRATION BIOPSIES;  Surgeon: Garner Nash, DO;  Location: [facility];  Service: Pulmonary;;   BRONCHIAL WASHINGS  08/30/2021   Procedure: BRONCHIAL WASHINGS;  Surgeon: Garner Nash, DO;  Location: [facility];  Service: Pulmonary;;   COLONOSCOPY     FINE NEEDLE ASPIRATION  08/30/2021   Procedure: FINE NEEDLE ASPIRATION (FNA) LINEAR;  Surgeon: Garner Nash, DO;  Location: [facility];  Service: Pulmonary;;   HERNIA REPAIR      inquinal   VIDEO BRONCHOSCOPY WITH ENDOBRONCHIAL NAVIGATION Left 08/30/2021   Procedure: VIDEO BRONCHOSCOPY WITH ENDOBRONCHIAL NAVIGATION;  Surgeon: Garner Nash, DO;  Location: [facility];  Service: Pulmonary;  Laterality: Left;  ION w/ CIOS   VIDEO BRONCHOSCOPY WITH ENDOBRONCHIAL ULTRASOUND Left 08/30/2021   Procedure: VIDEO BRONCHOSCOPY WITH ENDOBRONCHIAL ULTRASOUND;  Surgeon: Garner Nash, DO;  Location: [facility];  Service: Pulmonary;  Laterality: Left;   VIDEO BRONCHOSCOPY WITH RADIAL ENDOBRONCHIAL ULTRASOUND  08/30/2021   Procedure: RADIAL ENDOBRONCHIAL ULTRASOUND;  Surgeon: Garner Nash, DO;  Location: [facility];  Service: Pulmonary;;    Allergies: Patient has no known allergies.  Medications: Prior to Admission medications   Medication Sig Start Date End Date Taking? Authorizing Provider  ARIPiprazole (ABILIFY) 5 MG tablet Take 5 mg by mouth daily. For mood stabilization and depression    [provider]  calcium-vitamin D (OSCAL WITH D) 500-200 MG-UNIT tablet Take 1 tablet by mouth daily.    [provider]  cyanocobalamin 500 MCG tablet Take 500 mcg by mouth daily. Vitamin B12    [provider]  divalproex (DEPAKOTE) 500 MG DR tablet Take by mouth.    [provider]  hydrochlorothiazide (HYDRODIURIL) 25 MG tablet Take 12.5 mg by mouth daily.    [provider]  lidocaine-prilocaine (EMLA) cream Apply to the Port-A-Cath site 30-60-minute before chemotherapy 09/09/21   Curt Bears, MD  lisinopril (PRINIVIL,ZESTRIL) 40 MG tablet Take 1 tablet (40 mg total) by mouth daily. For hypertension. Patient taking differently: Take 40 mg by mouth daily. For  blood pressure 06/19/13   [facility], Milta Deiters T, PA-C  MELATONIN PO Take 1 tablet by mouth at bedtime.    [provider]  prochlorperazine (COMPAZINE) 10 MG tablet Take 1 tablet (10 mg total) by mouth every 6 (six) hours as needed for nausea or vomiting. 09/09/21    Curt Bears, MD  propranolol (INDERAL) 10 MG tablet Take 10 mg by mouth 2 (two) times daily.    [provider]  rosuvastatin (CRESTOR) 40 MG tablet Take 40 mg by mouth daily.    [provider]  sildenafil (VIAGRA) 100 MG tablet TAKE ONE-HALF TABLET BY MOUTH AS INSTRUCTED (TAKE ONE HOUR PRIOR TO SEXUAL ACTIVITY) *DO NOT EXCEED ONE DOSE IN A 24 HOUR PERIOD* 06/06/21   [provider]  Tiotropium Bromide-Olodaterol 2.5-2.5 MCG/ACT AERS INHALE 2 PUFFS BY MOUTH DAILY 02/19/21   [provider]  traZODone (DESYREL) 150 MG tablet Take 150 mg by mouth at bedtime as needed for sleep.    [provider]  venlafaxine XR (EFFEXOR-XR) 75 MG 24 hr capsule Take 75 mg by mouth daily with breakfast. 11/18/20   [provider]     History reviewed. No pertinent family history.  Social History   Socioeconomic History   Marital status: Married    Spouse name: Not on file   Number of children: Not on file   Years of education: Not on file   Highest education level: Not on file  Occupational History   Not on file  Tobacco Use   Smoking status: Every Day    Packs/day: 2.00    Years: 55.00    Pack years: 110.00    Types: Cigarettes    Start date: 49   Smokeless tobacco: Never   Tobacco comments:    Currently smoking 1.5ppd as of 10/28/22ep  Vaping Use   Vaping Use: Never used  Substance and Sexual Activity   Alcohol use: Yes    Comment: very rare   Drug use: Yes    Types: "Crack" cocaine    Comment: Patient stated he last used "last week" (week of Aug 23, 2021)   Sexual activity: Not on file  Other Topics Concern   Not on file  Social History Narrative   Not on file   Social Determinants of Health   Financial Resource Strain: Not on file  Food Insecurity: Not on file  Transportation Needs: Not on file  Physical Activity: Not on file  Stress: Not on file  Social Connections: Not on file    Review of Systems: A 12 point ROS  discussed and pertinent positives are indicated in the HPI above.  All other systems are negative.  Review of Systems  Constitutional:  Negative for appetite change and fatigue.  Respiratory:  Negative for cough and shortness of breath.   Cardiovascular:  Negative for chest pain and leg swelling.  Gastrointestinal:  Negative for abdominal pain, diarrhea, nausea and vomiting.  Neurological:  Negative for dizziness and headaches.   Vital Signs: 98.2, BP 129/89, HR 74, RR 18, 97% on RA   Physical Exam Constitutional:      General: He is not in acute distress. HENT:     Mouth/Throat:     Mouth: Mucous membranes are moist.     Pharynx: Oropharynx is clear.     Comments: edentulous Cardiovascular:     Rate and Rhythm: Normal rate and regular rhythm.     Pulses: Normal pulses.     Heart sounds: Murmur heard.  Pulmonary:     Effort: Pulmonary effort is normal.     Breath sounds: Normal breath sounds.  Abdominal:     General: Bowel sounds are normal.     Palpations: Abdomen is soft.     Tenderness: There is no abdominal tenderness.  Musculoskeletal:     Right lower leg: No edema.     Left lower leg: No edema.  Skin:    General: Skin is warm and dry.  Neurological:     Mental Status: He is alert and oriented to person, place, and time.    Imaging: DG CHEST PORT 1 VIEW  Result Date: 08/30/2021 CLINICAL DATA:  Status post bronchoscopy. EXAM: PORTABLE CHEST 1 VIEW COMPARISON:  CT chest dated July 15, 2021. Chest x-ray dated February 03, 2017. FINDINGS: Normal heart size. Mild peripheral and basilar predominant interstitial thickening is similar to recent CT, allowing for differences in technique, but has progressed since chest x-ray from April 2018. No focal consolidation, pleural effusion, or pneumothorax. No acute osseous abnormality. IMPRESSION: 1. No pneumothorax. 2. Unchanged interstitial lung disease. Electronically Signed   By: Titus Dubin M.D.   On: 08/30/2021 10:27   DG  C-ARM BRONCHOSCOPY  Result Date: 08/30/2021 C-ARM BRONCHOSCOPY: Fluoroscopy was utilized by the requesting physician.  No radiographic interpretation.    Labs:  CBC: Recent Labs    08/30/21 0701 08/30/21 0714 09/09/21 0945 09/20/21 1303  WBC  --  6.1 6.6 5.9  HGB 13.3 14.1 13.4 14.1  HCT 39.0 39.9 37.5* 38.5*  PLT  --  134* 130* 126*    COAGS: No results for input(s): INR, APTT in the last 8760 hours.  BMP: Recent Labs    08/30/21 0701 08/30/21 0714 09/09/21 0945 09/20/21 1303  NA 139 136 138 137  K 3.9 3.9 3.2* 3.8  CL 103 102 101 105  CO2  --  26 28 26   GLUCOSE 118* 114* 104* 152*  BUN 17 17 33* 12  CALCIUM  --  9.7 9.7 9.5  CREATININE 0.80 0.83 0.86 0.63  GFRNONAA  --  >60 >60 >60    LIVER FUNCTION TESTS: Recent Labs    08/30/21 0714 09/09/21 0945 09/20/21 1303  BILITOT 1.1 2.2* 0.7  AST 25 53* 21  ALT 30 50* 25  ALKPHOS 45 58 59  PROT 6.6 6.5 7.0  ALBUMIN 3.4* 3.7 3.5    TUMOR MARKERS: No results for input(s): AFPTM, CEA, CA199, CHROMGRNA in the last 8760 hours.  Assessment and Plan:  Metastatic lung cancer; palliative chemotherapy: Austin Wells, 75 year old male, presents today to the [facility] department for an image-guided port-a-catheter placement.  Risks and benefits of image-guided port-a-catheter placement were discussed with the patient including, but not limited to bleeding, infection, pneumothorax, or fibrin sheath development and need for additional procedures.  All of the patient's questions were answered, patient is agreeable to proceed. He has been NPO.   Consent signed and in chart.  Thank you for this interesting consult.  I greatly enjoyed meeting Paiden Cavell and look forward to participating in their care.  A copy of this report was sent to the requesting provider on this date.  Electronically Signed: Soyla Dryer, AGACNP-BC [phone] 09/26/2021, 1:41 PM   I spent a total of  30 Minutes    in face to face in clinical consultation, greater than 50% of which was counseling/coordinating care for port-a-catheter placement.

## 2021-09-27 ENCOUNTER — Inpatient Hospital Stay: Payer: No Typology Code available for payment source

## 2021-09-27 ENCOUNTER — Inpatient Hospital Stay (HOSPITAL_BASED_OUTPATIENT_CLINIC_OR_DEPARTMENT_OTHER): Payer: No Typology Code available for payment source | Admitting: Internal Medicine

## 2021-09-27 ENCOUNTER — Other Ambulatory Visit: Payer: Self-pay | Admitting: Medical Oncology

## 2021-09-27 ENCOUNTER — Encounter: Payer: Self-pay | Admitting: *Deleted

## 2021-09-27 VITALS — BP 138/93 | HR 93 | Temp 96.1°F | Resp 21 | Ht 71.0 in | Wt 177.8 lb

## 2021-09-27 DIAGNOSIS — Z5112 Encounter for antineoplastic immunotherapy: Secondary | ICD-10-CM

## 2021-09-27 DIAGNOSIS — Z95828 Presence of other vascular implants and grafts: Secondary | ICD-10-CM | POA: Insufficient documentation

## 2021-09-27 DIAGNOSIS — C3481 Malignant neoplasm of overlapping sites of right bronchus and lung: Secondary | ICD-10-CM

## 2021-09-27 DIAGNOSIS — Z5111 Encounter for antineoplastic chemotherapy: Secondary | ICD-10-CM

## 2021-09-27 LAB — CBC WITH DIFFERENTIAL (CANCER CENTER ONLY)
Abs Immature Granulocytes: 0.02 10*3/uL (ref 0.00–0.07)
Basophils Absolute: 0 10*3/uL (ref 0.0–0.1)
Basophils Relative: 0 %
Eosinophils Absolute: 0.1 10*3/uL (ref 0.0–0.5)
Eosinophils Relative: 2 %
HCT: 34.7 % — ABNORMAL LOW (ref 39.0–52.0)
Hemoglobin: 12.2 g/dL — ABNORMAL LOW (ref 13.0–17.0)
Immature Granulocytes: 0 %
Lymphocytes Relative: 37 %
Lymphs Abs: 1.7 10*3/uL (ref 0.7–4.0)
MCH: 30.9 pg (ref 26.0–34.0)
MCHC: 35.2 g/dL (ref 30.0–36.0)
MCV: 87.8 fL (ref 80.0–100.0)
Monocytes Absolute: 0.1 10*3/uL (ref 0.1–1.0)
Monocytes Relative: 3 %
Neutro Abs: 2.6 10*3/uL (ref 1.7–7.7)
Neutrophils Relative %: 58 %
Platelet Count: 101 10*3/uL — ABNORMAL LOW (ref 150–400)
RBC: 3.95 MIL/uL — ABNORMAL LOW (ref 4.22–5.81)
RDW: 11.5 % (ref 11.5–15.5)
WBC Count: 4.5 10*3/uL (ref 4.0–10.5)
nRBC: 0 % (ref 0.0–0.2)

## 2021-09-27 LAB — CMP (CANCER CENTER ONLY)
ALT: 34 U/L (ref 0–44)
AST: 18 U/L (ref 15–41)
Albumin: 3.2 g/dL — ABNORMAL LOW (ref 3.5–5.0)
Alkaline Phosphatase: 63 U/L (ref 38–126)
Anion gap: 9 (ref 5–15)
BUN: 19 mg/dL (ref 8–23)
CO2: 23 mmol/L (ref 22–32)
Calcium: 8.8 mg/dL — ABNORMAL LOW (ref 8.9–10.3)
Chloride: 105 mmol/L (ref 98–111)
Creatinine: 0.76 mg/dL (ref 0.61–1.24)
GFR, Estimated: >60 mL/min (ref >60)
Glucose, Bld: 140 mg/dL — ABNORMAL HIGH (ref 70–99)
Potassium: 4 mmol/L (ref 3.5–5.1)
Sodium: 137 mmol/L (ref 135–145)
Total Bilirubin: 0.7 mg/dL (ref 0.3–1.2)
Total Protein: 6.3 g/dL — ABNORMAL LOW (ref 6.5–8.1)

## 2021-09-27 MED ORDER — HEPARIN SOD (PORK) LOCK FLUSH 100 UNIT/ML IV SOLN
500.0000 [IU] | Freq: Once | INTRAVENOUS | Status: AC
Start: 2021-09-27 — End: 2021-09-27
  Administered 2021-09-27: 500 [IU]

## 2021-09-27 MED ORDER — SODIUM CHLORIDE 0.9% FLUSH
10.0000 mL | Freq: Once | INTRAVENOUS | Status: AC
  Administered 2021-09-27: 10 mL

## 2021-09-27 NOTE — Progress Notes (Signed)
Oncology Nurse Navigator Documentation  Oncology Nurse Navigator Flowsheets 09/27/2021 09/19/2021 09/14/2021 09/12/2021 09/09/2021 09/09/2021  Abnormal Finding Date - - - - - 06/11/2021  Confirmed Diagnosis Date 08/30/2021 - - - - 08/30/2021  Diagnosis Status Confirmed Diagnosis Complete - - - - Confirmed Diagnosis Complete  Planned Course of Treatment Chemotherapy;Targeted Therapy - - - - Chemotherapy;Targeted Therapy  Phase of Treatment Chemo - Targeted Therapy Targeted Therapy - Targeted Therapy  Chemotherapy Actual Start Date: - - 09/20/2021 - - -  Targeted Therapy Actual Start Date: - - 09/20/2021 - - -  Navigator Follow Up Date: 10/11/2021 - 09/27/2021 09/13/2021 - 09/13/2021  Navigator Follow Up Reason: Appointment Review - Follow-up Appointment Appointment Review - Appointment Review  Navigator Location [facility]  Navigator Encounter Type Clinic/MDC;Follow-up Appt Telephone Other: Other: Other: Clinic/MDC;Initial MedOnc  Telephone - Outgoing Call - - - -  Treatment Initiated Date - - 09/20/2021 - - -  Patient Visit Type MedOnc/spoke with patient and his girlfriend today for a follow up after his first systemic therapy. He is doing well without complaints. He states he continues to smoke so I provided him with smoking cessation information and encouraged cessation.  - Other - - Initial;MedOnc  Treatment Phase Treatment Pre-Tx/Tx Discussion - - - Pre-Tx/Tx Discussion  Barriers/Navigation Needs Education Coordination of Care;Education Coordination of Care Coordination of Care Coordination of Care Coordination of Care;Education  Education Other Other - - - Other;Smoking cessation;Newly Diagnosed Cancer Education;Understanding Cancer/ Treatment Options  Interventions Education;Psycho-Social Support Coordination of Care;Psycho-Social Support;Education Coordination of Care Coordination of Care Coordination of  Care Coordination of Care;Education;Psycho-Social Support  Acuity Level 3-Moderate Needs (3-4 Barriers Identified) Level 2-Minimal Needs (1-2 Barriers Identified) Level 2-Minimal Needs (1-2 Barriers Identified) Level 2-Minimal Needs (1-2 Barriers Identified) Level 2-Minimal Needs (1-2 Barriers Identified) Level 2-Minimal Needs (1-2 Barriers Identified)  Coordination of Care - Other Radiology;Other Appts Other Other  Education Method Verbal Other - - - Verbal;Written  Time Spent with Patient 30 60 30 30 30  30

## 2021-09-27 NOTE — Progress Notes (Signed)
[facility] Telephone:(336) [phone]   Fax:(336) [place] [address]  DIAGNOSIS: Extensive stage (T1b, N3, M1 a) small cell lung cancer presented with right upper lobe nodule in addition to multiple left lung nodule as well as bilateral hilar and mediastinal lymphadenopathy diagnosed in November 2022.  PRIOR THERAPY: None  CURRENT THERAPY: Systemic chemotherapy with carboplatin for AUC of 5 on day 1 and etoposide 100 Mg/M2 on days 1, 2 and 3 with Cosela for Myeloprotection as well as Imfinzi 1500 Mg IV every 3 weeks.  Started September 20, 2021.  Status post 1 cycle.   INTERVAL HISTORY: Austin Wells 75 y.o. male returns to the clinic today for follow-up visit accompanied by his wife.  The patient is feeling fine today with no concerning complaints.  He tolerated the first week of his treatment fairly well with no concerning adverse effects.  He denied having any current nausea, vomiting, diarrhea or constipation.  He has no chest pain, shortness of breath, cough or hemoptysis.  He has no recent weight loss or night sweats.  His MRI of the brain from the [place] facility showed no concerning findings for disease progression to the brain.  He is here today for evaluation and repeat blood work.  MEDICAL HISTORY: Past Medical History:  Diagnosis Date   Bipolar disorder ([place])    Depression    GERD (gastroesophageal reflux disease)    Hyperlipidemia    Hypertension    Orthostatic hypotension    Sleep apnea    has a cpap and doesn't use it   Syncope and collapse    Tobacco use     ALLERGIES:  has No Known Allergies.  MEDICATIONS:  Current Outpatient Medications  Medication Sig Dispense Refill   ARIPiprazole (ABILIFY) 5 MG tablet Take 5 mg by mouth daily. For mood stabilization and depression     calcium-vitamin D (OSCAL WITH D) 500-200 MG-UNIT tablet Take 1 tablet by mouth daily.      cyanocobalamin 500 MCG tablet Take 500 mcg by mouth daily. Vitamin B12     divalproex (DEPAKOTE) 500 MG DR tablet Take by mouth.     hydrochlorothiazide (HYDRODIURIL) 25 MG tablet Take 12.5 mg by mouth daily.     lidocaine-prilocaine (EMLA) cream Apply to the Port-A-Cath site 30-60-minute before chemotherapy 30 g 0   lisinopril (PRINIVIL,ZESTRIL) 40 MG tablet Take 1 tablet (40 mg total) by mouth daily. For hypertension. (Patient taking differently: Take 40 mg by mouth daily. For blood pressure)     MELATONIN PO Take 1 tablet by mouth at bedtime.     prochlorperazine (COMPAZINE) 10 MG tablet Take 1 tablet (10 mg total) by mouth every 6 (six) hours as needed for nausea or vomiting. 30 tablet 0   propranolol (INDERAL) 10 MG tablet Take 10 mg by mouth 2 (two) times daily.     rosuvastatin (CRESTOR) 40 MG tablet Take 40 mg by mouth daily.     sildenafil (VIAGRA) 100 MG tablet TAKE ONE-HALF TABLET BY MOUTH AS INSTRUCTED (TAKE ONE HOUR PRIOR TO SEXUAL ACTIVITY) *DO NOT EXCEED ONE DOSE IN A 24 HOUR PERIOD*     Tiotropium Bromide-Olodaterol 2.5-2.5 MCG/ACT AERS INHALE 2 PUFFS BY MOUTH DAILY     traZODone (DESYREL) 150 MG tablet Take 150 mg by mouth at bedtime as needed for sleep.     venlafaxine XR (EFFEXOR-XR) 75 MG 24 hr capsule Take 75 mg  by mouth daily with breakfast.     No current facility-administered medications for this visit.    SURGICAL HISTORY:  Past Surgical History:  Procedure Laterality Date   BRONCHIAL BIOPSY  08/30/2021   Procedure: BRONCHIAL BIOPSIES;  Surgeon: Garner Nash, DO;  Location: [facility];  Service: Pulmonary;;   BRONCHIAL BRUSHINGS  08/30/2021   Procedure: BRONCHIAL BRUSHINGS;  Surgeon: Garner Nash, DO;  Location: [facility];  Service: Pulmonary;;   BRONCHIAL NEEDLE ASPIRATION BIOPSY  08/30/2021   Procedure: BRONCHIAL NEEDLE ASPIRATION BIOPSIES;  Surgeon: Garner Nash, DO;  Location: [facility];  Service: Pulmonary;;   BRONCHIAL WASHINGS  08/30/2021    Procedure: BRONCHIAL WASHINGS;  Surgeon: Garner Nash, DO;  Location: [facility];  Service: Pulmonary;;   COLONOSCOPY     FINE NEEDLE ASPIRATION  08/30/2021   Procedure: FINE NEEDLE ASPIRATION (FNA) LINEAR;  Surgeon: Garner Nash, DO;  Location: [facility];  Service: Pulmonary;;   HERNIA REPAIR     inquinal   IR IMAGING GUIDED PORT INSERTION  09/26/2021   VIDEO BRONCHOSCOPY WITH ENDOBRONCHIAL NAVIGATION Left 08/30/2021   Procedure: VIDEO BRONCHOSCOPY WITH ENDOBRONCHIAL NAVIGATION;  Surgeon: Garner Nash, DO;  Location: [facility];  Service: Pulmonary;  Laterality: Left;  ION w/ CIOS   VIDEO BRONCHOSCOPY WITH ENDOBRONCHIAL ULTRASOUND Left 08/30/2021   Procedure: VIDEO BRONCHOSCOPY WITH ENDOBRONCHIAL ULTRASOUND;  Surgeon: Garner Nash, DO;  Location: [facility];  Service: Pulmonary;  Laterality: Left;   VIDEO BRONCHOSCOPY WITH RADIAL ENDOBRONCHIAL ULTRASOUND  08/30/2021   Procedure: RADIAL ENDOBRONCHIAL ULTRASOUND;  Surgeon: Garner Nash, DO;  Location: [facility];  Service: Pulmonary;;    REVIEW OF SYSTEMS:  A comprehensive review of systems was negative.   PHYSICAL EXAMINATION: General appearance: alert, cooperative, fatigued, and no distress Head: Normocephalic, without obvious abnormality, atraumatic Neck: no adenopathy, no JVD, supple, symmetrical, trachea midline, and thyroid not enlarged, symmetric, no tenderness/mass/nodules Lymph nodes: Cervical, supraclavicular, and axillary nodes normal. Resp: clear to auscultation bilaterally Back: symmetric, no curvature. ROM normal. No CVA tenderness. Cardio: regular rate and rhythm, S1, S2 normal, no murmur, click, rub or gallop GI: soft, non-tender; bowel sounds normal; no masses,  no organomegaly Extremities: extremities normal, atraumatic, no cyanosis or edema  ECOG PERFORMANCE STATUS: 1 - Symptomatic but completely ambulatory  Blood pressure (!) 138/93, pulse 93, temperature (!) 96.1 F (35.6 C), temperature  source Tympanic, resp. rate (!) 21, height 5\' 11"  (1.803 m), weight 177 lb 12.8 oz (80.6 kg), SpO2 100 %.  LABORATORY DATA: Lab Results  Component Value Date   WBC 4.5 09/27/2021   HGB 12.2 (L) 09/27/2021   HCT 34.7 (L) 09/27/2021   MCV 87.8 09/27/2021   PLT 101 (L) 09/27/2021      Chemistry      Component Value Date/Time   NA 137 09/20/2021 1303   K 3.8 09/20/2021 1303   CL 105 09/20/2021 1303   CO2 26 09/20/2021 1303   BUN 12 09/20/2021 1303   CREATININE 0.63 09/20/2021 1303      Component Value Date/Time   CALCIUM 9.5 09/20/2021 1303   ALKPHOS 59 09/20/2021 1303   AST 21 09/20/2021 1303   ALT 25 09/20/2021 1303   BILITOT 0.7 09/20/2021 1303       RADIOGRAPHIC STUDIES: DG CHEST PORT 1 VIEW  Result Date: 08/30/2021 CLINICAL DATA:  Status post bronchoscopy. EXAM: PORTABLE CHEST 1 VIEW COMPARISON:  CT chest dated July 15, 2021. Chest x-ray dated February 03, 2017. FINDINGS: Normal heart size.  Mild peripheral and basilar predominant interstitial thickening is similar to recent CT, allowing for differences in technique, but has progressed since chest x-ray from April 2018. No focal consolidation, pleural effusion, or pneumothorax. No acute osseous abnormality. IMPRESSION: 1. No pneumothorax. 2. Unchanged interstitial lung disease. Electronically Signed   By: Titus Dubin M.D.   On: 08/30/2021 10:27   IR IMAGING GUIDED PORT INSERTION  Result Date: 09/26/2021 INDICATION: 75 year old with small cell lung cancer. Port-A-Cath needed for treatment. EXAM: FLUOROSCOPIC AND ULTRASOUND GUIDED PLACEMENT OF A SUBCUTANEOUS PORT COMPARISON:  None. MEDICATIONS: Moderate sedation ANESTHESIA/SEDATION: Moderate (conscious) sedation was employed during this procedure. A total of Versed 2.0mg  and fentanyl 75 mcg was administered intravenously at the order of the provider performing the procedure. Total intra-service moderate sedation time: 37 minutes. Patient's level of consciousness and vital  signs were monitored continuously by radiology nurse throughout the procedure under the supervision of the provider performing the procedure. FLUOROSCOPY TIME:  22 seconds, 1 mGy COMPLICATIONS: None immediate. PROCEDURE: The procedure, risks, benefits, and alternatives were explained to the patient. Questions regarding the procedure were encouraged and answered. The patient understands and consents to the procedure. Patient was placed supine on the interventional table. Ultrasound confirmed a patent right internal jugular vein. Ultrasound image was saved for documentation. The right chest and neck were cleaned with a skin antiseptic and a sterile drape was placed. Maximal barrier sterile technique was utilized including caps, mask, sterile gowns, sterile gloves, sterile drape, hand hygiene and skin antiseptic. The right neck was anesthetized with 1% lidocaine. Small incision was made in the right neck with a blade. Micropuncture set was placed in the right internal jugular vein with ultrasound guidance. The micropuncture wire was used for measurement purposes. The right chest was anesthetized with 1% lidocaine with epinephrine. #15 blade was used to make an incision and a subcutaneous port pocket was formed. [place] was assembled. Subcutaneous tunnel was formed with a stiff tunneling device. The port catheter was brought through the subcutaneous tunnel. The port was placed in the subcutaneous pocket. The micropuncture set was exchanged for a peel-away sheath. The catheter was placed through the peel-away sheath and the tip was positioned at the superior cavoatrial junction. Catheter placement was confirmed with fluoroscopy. The port was accessed and flushed with heparinized saline. The port pocket was closed using two layers of absorbable sutures and Dermabond. The vein skin site was closed using a single layer of absorbable suture and Dermabond. Sterile dressings were applied. Patient tolerated the  procedure well without an immediate complication. Ultrasound and fluoroscopic images were taken and saved for this procedure. IMPRESSION: Placement of a subcutaneous power-injectable port device. Catheter tip at the superior cavoatrial junction. Electronically Signed   By: Markus Daft M.D.   On: 09/26/2021 16:32   DG C-ARM BRONCHOSCOPY  Result Date: 08/30/2021 C-ARM BRONCHOSCOPY: Fluoroscopy was utilized by the requesting physician.  No radiographic interpretation.    ASSESSMENT AND PLAN: This is a very pleasant 75 years old white male diagnosed with extensive stage (T1b, N3, M1 a) small cell lung cancer presented with right upper lobe lung nodule in addition to multiple left lung nodules and bilateral and mediastinal lymphadenopathy diagnosed in November 2022. The patient is currently undergoing systemic chemotherapy with carboplatin for AUC of 5 on day 1, etoposide 100 Mg/M2 on days 1, 2 and 3 with Cosela 240 Mg/M2 for Myeloprotection before chemotherapy as well as Imfinzi 1500 Mg IV every 3 weeks with chemo.  He is  status post 1 cycle of treatment started last week. The patient tolerated the first week of his treatment fairly well with no concerning adverse effects. I recommended for him to continue his treatment as planned and he is expected to start cycle #2 in 2 weeks. He will come back for follow-up visit at that time. He was advised to call immediately if he has any other concerning symptoms in the interval. The patient voices understanding of current disease status and treatment options and is in agreement with the current care plan.  All questions were answered. The patient knows to call the clinic with any problems, questions or concerns. We can certainly see the patient much sooner if necessary.  The total time spent in the appointment was 20 minutes.  Disclaimer: This note was dictated with voice recognition software. Similar sounding words can inadvertently be transcribed and may not  be corrected upon review.

## 2021-09-29 LAB — FUNGUS CULTURE RESULT

## 2021-09-29 LAB — FUNGUS CULTURE WITH STAIN

## 2021-09-29 LAB — FUNGAL ORGANISM REFLEX

## 2021-10-04 ENCOUNTER — Inpatient Hospital Stay: Payer: No Typology Code available for payment source

## 2021-10-04 ENCOUNTER — Other Ambulatory Visit: Payer: Self-pay

## 2021-10-04 DIAGNOSIS — Z95828 Presence of other vascular implants and grafts: Secondary | ICD-10-CM

## 2021-10-04 DIAGNOSIS — Z5112 Encounter for antineoplastic immunotherapy: Secondary | ICD-10-CM | POA: Diagnosis not present

## 2021-10-04 DIAGNOSIS — C3481 Malignant neoplasm of overlapping sites of right bronchus and lung: Secondary | ICD-10-CM

## 2021-10-04 LAB — CBC WITH DIFFERENTIAL (CANCER CENTER ONLY)
Abs Immature Granulocytes: 0.01 10*3/uL (ref 0.00–0.07)
Basophils Absolute: 0 10*3/uL (ref 0.0–0.1)
Basophils Relative: 0 %
Eosinophils Absolute: 0.1 10*3/uL (ref 0.0–0.5)
Eosinophils Relative: 2 %
HCT: 36.6 % — ABNORMAL LOW (ref 39.0–52.0)
Hemoglobin: 13.1 g/dL (ref 13.0–17.0)
Immature Granulocytes: 0 %
Lymphocytes Relative: 45 %
Lymphs Abs: 1.3 10*3/uL (ref 0.7–4.0)
MCH: 31 pg (ref 26.0–34.0)
MCHC: 35.8 g/dL (ref 30.0–36.0)
MCV: 86.5 fL (ref 80.0–100.0)
Monocytes Absolute: 0.5 10*3/uL (ref 0.1–1.0)
Monocytes Relative: 15 %
Neutro Abs: 1.2 10*3/uL — ABNORMAL LOW (ref 1.7–7.7)
Neutrophils Relative %: 38 %
Platelet Count: 86 10*3/uL — ABNORMAL LOW (ref 150–400)
RBC: 4.23 MIL/uL (ref 4.22–5.81)
RDW: 12 % (ref 11.5–15.5)
WBC Count: 3 10*3/uL — ABNORMAL LOW (ref 4.0–10.5)
nRBC: 0 % (ref 0.0–0.2)

## 2021-10-04 LAB — CMP (CANCER CENTER ONLY)
ALT: 31 U/L (ref 0–44)
AST: 21 U/L (ref 15–41)
Albumin: 3.4 g/dL — ABNORMAL LOW (ref 3.5–5.0)
Alkaline Phosphatase: 65 U/L (ref 38–126)
Anion gap: 7 (ref 5–15)
BUN: 16 mg/dL (ref 8–23)
CO2: 25 mmol/L (ref 22–32)
Calcium: 9.2 mg/dL (ref 8.9–10.3)
Chloride: 105 mmol/L (ref 98–111)
Creatinine: 0.69 mg/dL (ref 0.61–1.24)
GFR, Estimated: >60 mL/min (ref >60)
Glucose, Bld: 116 mg/dL — ABNORMAL HIGH (ref 70–99)
Potassium: 4.1 mmol/L (ref 3.5–5.1)
Sodium: 137 mmol/L (ref 135–145)
Total Bilirubin: 0.8 mg/dL (ref 0.3–1.2)
Total Protein: 6.8 g/dL (ref 6.5–8.1)

## 2021-10-04 MED ORDER — SODIUM CHLORIDE 0.9% FLUSH
10.0000 mL | Freq: Once | INTRAVENOUS | Status: AC
  Administered 2021-10-04: 10 mL

## 2021-10-04 MED ORDER — HEPARIN SOD (PORK) LOCK FLUSH 100 UNIT/ML IV SOLN
500.0000 [IU] | Freq: Once | INTRAVENOUS | Status: AC
  Administered 2021-10-04: 500 [IU]

## 2021-10-11 ENCOUNTER — Other Ambulatory Visit: Payer: Self-pay

## 2021-10-11 ENCOUNTER — Inpatient Hospital Stay: Payer: No Typology Code available for payment source

## 2021-10-11 ENCOUNTER — Other Ambulatory Visit: Payer: No Typology Code available for payment source

## 2021-10-11 ENCOUNTER — Inpatient Hospital Stay (HOSPITAL_BASED_OUTPATIENT_CLINIC_OR_DEPARTMENT_OTHER): Payer: No Typology Code available for payment source | Admitting: Internal Medicine

## 2021-10-11 VITALS — BP 143/94 | HR 73 | Temp 96.7°F | Resp 19 | Ht 71.0 in | Wt 189.5 lb

## 2021-10-11 DIAGNOSIS — C3481 Malignant neoplasm of overlapping sites of right bronchus and lung: Secondary | ICD-10-CM

## 2021-10-11 DIAGNOSIS — Z95828 Presence of other vascular implants and grafts: Secondary | ICD-10-CM

## 2021-10-11 DIAGNOSIS — C3490 Malignant neoplasm of unspecified part of unspecified bronchus or lung: Secondary | ICD-10-CM | POA: Diagnosis not present

## 2021-10-11 DIAGNOSIS — Z5112 Encounter for antineoplastic immunotherapy: Secondary | ICD-10-CM | POA: Diagnosis not present

## 2021-10-11 LAB — CBC WITH DIFFERENTIAL (CANCER CENTER ONLY)
Abs Immature Granulocytes: 0.03 10*3/uL (ref 0.00–0.07)
Basophils Absolute: 0 10*3/uL (ref 0.0–0.1)
Basophils Relative: 1 %
Eosinophils Absolute: 0.1 10*3/uL (ref 0.0–0.5)
Eosinophils Relative: 2 %
HCT: 35.3 % — ABNORMAL LOW (ref 39.0–52.0)
Hemoglobin: 12.7 g/dL — ABNORMAL LOW (ref 13.0–17.0)
Immature Granulocytes: 1 %
Lymphocytes Relative: 39 %
Lymphs Abs: 1.8 10*3/uL (ref 0.7–4.0)
MCH: 31.1 pg (ref 26.0–34.0)
MCHC: 36 g/dL (ref 30.0–36.0)
MCV: 86.5 fL (ref 80.0–100.0)
Monocytes Absolute: 1 10*3/uL (ref 0.1–1.0)
Monocytes Relative: 22 %
Neutro Abs: 1.6 10*3/uL — ABNORMAL LOW (ref 1.7–7.7)
Neutrophils Relative %: 35 %
Platelet Count: 171 10*3/uL (ref 150–400)
RBC: 4.08 MIL/uL — ABNORMAL LOW (ref 4.22–5.81)
RDW: 12.8 % (ref 11.5–15.5)
WBC Count: 4.5 10*3/uL (ref 4.0–10.5)
nRBC: 0 % (ref 0.0–0.2)

## 2021-10-11 LAB — CMP (CANCER CENTER ONLY)
ALT: 20 U/L (ref 0–44)
AST: 16 U/L (ref 15–41)
Albumin: 3.7 g/dL (ref 3.5–5.0)
Alkaline Phosphatase: 62 U/L (ref 38–126)
Anion gap: 5 (ref 5–15)
BUN: 10 mg/dL (ref 8–23)
CO2: 27 mmol/L (ref 22–32)
Calcium: 9.4 mg/dL (ref 8.9–10.3)
Chloride: 106 mmol/L (ref 98–111)
Creatinine: 0.74 mg/dL (ref 0.61–1.24)
GFR, Estimated: >60 mL/min (ref >60)
Glucose, Bld: 106 mg/dL — ABNORMAL HIGH (ref 70–99)
Potassium: 4 mmol/L (ref 3.5–5.1)
Sodium: 138 mmol/L (ref 135–145)
Total Bilirubin: 0.5 mg/dL (ref 0.3–1.2)
Total Protein: 6.6 g/dL (ref 6.5–8.1)

## 2021-10-11 LAB — TSH: TSH: <0.08 u[IU]/mL — ABNORMAL LOW (ref 0.320–4.118)

## 2021-10-11 MED ORDER — SODIUM CHLORIDE 0.9 % IV SOLN
100.0000 mg/m2 | Freq: Once | INTRAVENOUS | Status: AC
  Administered 2021-10-11: 14:00:00 200 mg via INTRAVENOUS
  Filled 2021-10-11: qty 10

## 2021-10-11 MED ORDER — SODIUM CHLORIDE 0.9 % IV SOLN
495.0000 mg | Freq: Once | INTRAVENOUS | Status: AC
  Administered 2021-10-11: 13:00:00 500 mg via INTRAVENOUS
  Filled 2021-10-11: qty 50

## 2021-10-11 MED ORDER — SODIUM CHLORIDE 0.9 % IV SOLN: Freq: Once | INTRAVENOUS | Status: AC

## 2021-10-11 MED ORDER — SODIUM CHLORIDE 0.9 % IV SOLN
150.0000 mg | Freq: Once | INTRAVENOUS | Status: AC
  Administered 2021-10-11: 11:00:00 150 mg via INTRAVENOUS
  Filled 2021-10-11: qty 150

## 2021-10-11 MED ORDER — SODIUM CHLORIDE 0.9% FLUSH
10.0000 mL | INTRAVENOUS | Status: DC | PRN
  Administered 2021-10-11: 15:00:00 10 mL

## 2021-10-11 MED ORDER — TRILACICLIB DIHYDROCHLORIDE INJECTION 300 MG
240.0000 mg/m2 | Freq: Once | INTRAVENOUS | Status: AC
  Administered 2021-10-11: 12:00:00 480 mg via INTRAVENOUS
  Filled 2021-10-11: qty 32

## 2021-10-11 MED ORDER — SODIUM CHLORIDE 0.9% FLUSH
10.0000 mL | Freq: Once | INTRAVENOUS | Status: AC
Start: 2021-10-11 — End: 2021-10-11
  Administered 2021-10-11: 09:00:00 10 mL

## 2021-10-11 MED ORDER — SODIUM CHLORIDE 0.9 % IV SOLN
10.0000 mg | Freq: Once | INTRAVENOUS | Status: AC
  Administered 2021-10-11: 11:00:00 10 mg via INTRAVENOUS
  Filled 2021-10-11: qty 10

## 2021-10-11 MED ORDER — PALONOSETRON HCL INJECTION 0.25 MG/5ML
0.2500 mg | Freq: Once | INTRAVENOUS | Status: AC
  Administered 2021-10-11: 11:00:00 0.25 mg via INTRAVENOUS
  Filled 2021-10-11: qty 5

## 2021-10-11 MED ORDER — HEPARIN SOD (PORK) LOCK FLUSH 100 UNIT/ML IV SOLN
500.0000 [IU] | Freq: Once | INTRAVENOUS | Status: AC | PRN
  Administered 2021-10-11: 15:00:00 500 [IU]

## 2021-10-11 MED ORDER — SODIUM CHLORIDE 0.9 % IV SOLN
1500.0000 mg | Freq: Once | INTRAVENOUS | Status: AC
  Administered 2021-10-11: 12:00:00 1500 mg via INTRAVENOUS
  Filled 2021-10-11: qty 30

## 2021-10-11 NOTE — Progress Notes (Signed)
[facility] [phone]   Fax:(336) [place] [address]  DIAGNOSIS: Extensive stage (T1b, N3, M1 a) small cell lung cancer presented with right upper lobe nodule in addition to multiple left lung nodule as well as bilateral hilar and mediastinal lymphadenopathy diagnosed in November 2022.  PRIOR THERAPY: None  CURRENT THERAPY: Systemic chemotherapy with carboplatin for AUC of 5 on day 1 and etoposide 100 Mg/M2 on days 1, 2 and 3 with Cosela for Myeloprotection as well as Imfinzi 1500 Mg IV every 3 weeks.  Started September 20, 2021.  Status post 1 cycle.   INTERVAL HISTORY: Austin Wells 75 y.o. male returns to the clinic today for follow-up visit accompanied by his wife.  The patient is feeling fine today with no concerning complaints.  He denied having any current chest pain, shortness of breath, cough or hemoptysis.  He denied having any fever or chills.  He has no nausea, vomiting, diarrhea or constipation.  He has no headache or visual changes.  He denied having any weight loss or night sweats.  He actually gained around 11 pounds since his last visit.  He is here today for evaluation before starting cycle #2 of his chemotherapy. MEDICAL HISTORY: Past Medical History:  Diagnosis Date   Bipolar disorder ([place])    Depression    GERD (gastroesophageal reflux disease)    Hyperlipidemia    Hypertension    Orthostatic hypotension    Sleep apnea    has a cpap and doesn't use it   Syncope and collapse    Tobacco use     ALLERGIES:  has No Known Allergies.  MEDICATIONS:  Current Outpatient Medications  Medication Sig Dispense Refill   ARIPiprazole (ABILIFY) 5 MG tablet Take 5 mg by mouth daily. For mood stabilization and depression     calcium-vitamin D (OSCAL WITH D) 500-200 MG-UNIT tablet Take 1 tablet by mouth daily.     cyanocobalamin 500 MCG tablet Take 500  mcg by mouth daily. Vitamin B12     divalproex (DEPAKOTE) 500 MG DR tablet Take by mouth.     hydrochlorothiazide (HYDRODIURIL) 25 MG tablet Take 12.5 mg by mouth daily.     lidocaine-prilocaine (EMLA) cream Apply to the Port-A-Cath site 30-60-minute before chemotherapy 30 g 0   lisinopril (PRINIVIL,ZESTRIL) 40 MG tablet Take 1 tablet (40 mg total) by mouth daily. For hypertension. (Patient taking differently: Take 40 mg by mouth daily. For blood pressure)     MELATONIN PO Take 1 tablet by mouth at bedtime.     prochlorperazine (COMPAZINE) 10 MG tablet Take 1 tablet (10 mg total) by mouth every 6 (six) hours as needed for nausea or vomiting. 30 tablet 0   propranolol (INDERAL) 10 MG tablet Take 10 mg by mouth 2 (two) times daily.     rosuvastatin (CRESTOR) 40 MG tablet Take 40 mg by mouth daily.     sildenafil (VIAGRA) 100 MG tablet TAKE ONE-HALF TABLET BY MOUTH AS INSTRUCTED (TAKE ONE HOUR PRIOR TO SEXUAL ACTIVITY) *DO NOT EXCEED ONE DOSE IN A 24 HOUR PERIOD*     Tiotropium Bromide-Olodaterol 2.5-2.5 MCG/ACT AERS INHALE 2 PUFFS BY MOUTH DAILY     traZODone (DESYREL) 150 MG tablet Take 150 mg by mouth at bedtime as needed for sleep.     venlafaxine XR (EFFEXOR-XR) 75 MG 24 hr capsule Take 75 mg by mouth daily with breakfast.  No current facility-administered medications for this visit.    SURGICAL HISTORY:  Past Surgical History:  Procedure Laterality Date   BRONCHIAL BIOPSY  08/30/2021   Procedure: BRONCHIAL BIOPSIES;  Surgeon: Garner Nash, DO;  Location: [facility];  Service: Pulmonary;;   BRONCHIAL BRUSHINGS  08/30/2021   Procedure: BRONCHIAL BRUSHINGS;  Surgeon: Garner Nash, DO;  Location: [facility];  Service: Pulmonary;;   BRONCHIAL NEEDLE ASPIRATION BIOPSY  08/30/2021   Procedure: BRONCHIAL NEEDLE ASPIRATION BIOPSIES;  Surgeon: Garner Nash, DO;  Location: [facility];  Service: Pulmonary;;   BRONCHIAL WASHINGS  08/30/2021   Procedure: BRONCHIAL WASHINGS;  Surgeon:  Garner Nash, DO;  Location: [facility];  Service: Pulmonary;;   COLONOSCOPY     FINE NEEDLE ASPIRATION  08/30/2021   Procedure: FINE NEEDLE ASPIRATION (FNA) LINEAR;  Surgeon: Garner Nash, DO;  Location: [facility];  Service: Pulmonary;;   HERNIA REPAIR     inquinal   IR IMAGING GUIDED PORT INSERTION  09/26/2021   VIDEO BRONCHOSCOPY WITH ENDOBRONCHIAL NAVIGATION Left 08/30/2021   Procedure: VIDEO BRONCHOSCOPY WITH ENDOBRONCHIAL NAVIGATION;  Surgeon: Garner Nash, DO;  Location: [facility];  Service: Pulmonary;  Laterality: Left;  ION w/ CIOS   VIDEO BRONCHOSCOPY WITH ENDOBRONCHIAL ULTRASOUND Left 08/30/2021   Procedure: VIDEO BRONCHOSCOPY WITH ENDOBRONCHIAL ULTRASOUND;  Surgeon: Garner Nash, DO;  Location: [facility];  Service: Pulmonary;  Laterality: Left;   VIDEO BRONCHOSCOPY WITH RADIAL ENDOBRONCHIAL ULTRASOUND  08/30/2021   Procedure: RADIAL ENDOBRONCHIAL ULTRASOUND;  Surgeon: Garner Nash, DO;  Location: [facility];  Service: Pulmonary;;    REVIEW OF SYSTEMS:  A comprehensive review of systems was negative.   PHYSICAL EXAMINATION: General appearance: alert, cooperative, and no distress Head: Normocephalic, without obvious abnormality, atraumatic Neck: no adenopathy, no JVD, supple, symmetrical, trachea midline, and thyroid not enlarged, symmetric, no tenderness/mass/nodules Lymph nodes: Cervical, supraclavicular, and axillary nodes normal. Resp: clear to auscultation bilaterally Back: symmetric, no curvature. ROM normal. No CVA tenderness. Cardio: regular rate and rhythm, S1, S2 normal, no murmur, click, rub or gallop GI: soft, non-tender; bowel sounds normal; no masses,  no organomegaly Extremities: extremities normal, atraumatic, no cyanosis or edema  ECOG PERFORMANCE STATUS: 0 - Asymptomatic  Blood pressure (!) 143/94, pulse 73, temperature (!) 96.7 F (35.9 C), temperature source Tympanic, resp. rate 19, height 5\' 11"  (1.803 m), weight 189 lb 8 oz (86  kg), SpO2 100 %.  LABORATORY DATA: Lab Results  Component Value Date   WBC 4.5 10/11/2021   HGB 12.7 (L) 10/11/2021   HCT 35.3 (L) 10/11/2021   MCV 86.5 10/11/2021   PLT 171 10/11/2021      Chemistry      Component Value Date/Time   NA 138 10/11/2021 0907   K 4.0 10/11/2021 0907   CL 106 10/11/2021 0907   CO2 27 10/11/2021 0907   BUN 10 10/11/2021 0907   CREATININE 0.74 10/11/2021 0907      Component Value Date/Time   CALCIUM 9.4 10/11/2021 0907   ALKPHOS 62 10/11/2021 0907   AST 16 10/11/2021 0907   ALT 20 10/11/2021 0907   BILITOT 0.5 10/11/2021 0907       RADIOGRAPHIC STUDIES: IR IMAGING GUIDED PORT INSERTION  Result Date: 09/26/2021 INDICATION: 75 year old with small cell lung cancer. Port-A-Cath needed for treatment. EXAM: FLUOROSCOPIC AND ULTRASOUND GUIDED PLACEMENT OF A SUBCUTANEOUS PORT COMPARISON:  None. MEDICATIONS: Moderate sedation ANESTHESIA/SEDATION: Moderate (conscious) sedation was employed during this procedure. A total of Versed 2.0mg  and fentanyl 75  mcg was administered intravenously at the order of the provider performing the procedure. Total intra-service moderate sedation time: 37 minutes. Patient's level of consciousness and vital signs were monitored continuously by radiology nurse throughout the procedure under the supervision of the provider performing the procedure. FLUOROSCOPY TIME:  22 seconds, 1 mGy COMPLICATIONS: None immediate. PROCEDURE: The procedure, risks, benefits, and alternatives were explained to the patient. Questions regarding the procedure were encouraged and answered. The patient understands and consents to the procedure. Patient was placed supine on the interventional table. Ultrasound confirmed a patent right internal jugular vein. Ultrasound image was saved for documentation. The right chest and neck were cleaned with a skin antiseptic and a sterile drape was placed. Maximal barrier sterile technique was utilized including caps, mask,  sterile gowns, sterile gloves, sterile drape, hand hygiene and skin antiseptic. The right neck was anesthetized with 1% lidocaine. Small incision was made in the right neck with a blade. Micropuncture set was placed in the right internal jugular vein with ultrasound guidance. The micropuncture wire was used for measurement purposes. The right chest was anesthetized with 1% lidocaine with epinephrine. #15 blade was used to make an incision and a subcutaneous port pocket was formed. [place] was assembled. Subcutaneous tunnel was formed with a stiff tunneling device. The port catheter was brought through the subcutaneous tunnel. The port was placed in the subcutaneous pocket. The micropuncture set was exchanged for a peel-away sheath. The catheter was placed through the peel-away sheath and the tip was positioned at the superior cavoatrial junction. Catheter placement was confirmed with fluoroscopy. The port was accessed and flushed with heparinized saline. The port pocket was closed using two layers of absorbable sutures and Dermabond. The vein skin site was closed using a single layer of absorbable suture and Dermabond. Sterile dressings were applied. Patient tolerated the procedure well without an immediate complication. Ultrasound and fluoroscopic images were taken and saved for this procedure. IMPRESSION: Placement of a subcutaneous power-injectable port device. Catheter tip at the superior cavoatrial junction. Electronically Signed   By: Markus Daft M.D.   On: 09/26/2021 16:32    ASSESSMENT AND PLAN: This is a very pleasant 74 years old white male diagnosed with extensive stage (T1b, N3, M1 a) small cell lung cancer presented with right upper lobe lung nodule in addition to multiple left lung nodules and bilateral and mediastinal lymphadenopathy diagnosed in November 2022. The patient is currently undergoing systemic chemotherapy with carboplatin for AUC of 5 on day 1, etoposide 100 Mg/M2 on days 1,  2 and 3 with Cosela 240 Mg/M2 for Myeloprotection before chemotherapy as well as Imfinzi 1500 Mg IV every 3 weeks with chemo.  He is status post 1 cycle of treatment. The patient tolerated the first cycle of his treatment well with no concerning adverse effects. I recommended for him to proceed with cycle #2 today as planned. I will see him back for follow-up visit in 3 weeks for evaluation with repeat CT scan of the chest, abdomen pelvis for restaging of his disease.  He was advised to call immediately if he has any other concerning symptoms in the interval. All questions were answered. The patient knows to call the clinic with any problems, questions or concerns. We can certainly see the patient much sooner if necessary.  Disclaimer: This note was dictated with voice recognition software. Similar sounding words can inadvertently be transcribed and may not be corrected upon review.

## 2021-10-11 NOTE — Patient Instructions (Signed)
[facility]  Discharge Instructions: Thank you for choosing [facility] to provide your oncology and hematology care.   If you have a lab appointment with the [facility], please go directly to the [facility] and check in at the registration area.   Wear comfortable clothing and clothing appropriate for easy access to any Portacath or PICC line.   We strive to give you quality time with your provider. You may need to reschedule your appointment if you arrive late (15 or more minutes).  Arriving late affects you and other patients whose appointments are after yours.  Also, if you miss three or more appointments without notifying the office, you may be dismissed from the clinic at the providers discretion.      For prescription refill requests, have your pharmacy contact our office and allow 72 hours for refills to be completed.    Today you received the following chemotherapy and/or immunotherapy agents: Imfinzi, Carboplatin, Etoposide.       To help prevent nausea and vomiting after your treatment, we encourage you to take your nausea medication as directed.  BELOW ARE SYMPTOMS THAT SHOULD BE REPORTED IMMEDIATELY: *FEVER GREATER THAN 100.4 F (38 C) OR HIGHER *CHILLS OR SWEATING *NAUSEA AND VOMITING THAT IS NOT CONTROLLED WITH YOUR NAUSEA MEDICATION *UNUSUAL SHORTNESS OF BREATH *UNUSUAL BRUISING OR BLEEDING *URINARY PROBLEMS (pain or burning when urinating, or frequent urination) *BOWEL PROBLEMS (unusual diarrhea, constipation, pain near the anus) TENDERNESS IN MOUTH AND THROAT WITH OR WITHOUT PRESENCE OF ULCERS (sore throat, sores in mouth, or a toothache) UNUSUAL RASH, SWELLING OR PAIN  UNUSUAL VAGINAL DISCHARGE OR ITCHING   Items with * indicate a potential emergency and should be followed up as soon as possible or go to the Emergency Department if any problems should occur.  Please show the CHEMOTHERAPY ALERT CARD or IMMUNOTHERAPY  ALERT CARD at check-in to the Emergency Department and triage nurse.  Should you have questions after your visit or need to cancel or reschedule your appointment, please contact [facility]  Dept: [phone]  and follow the prompts.  Office hours are 8:00 a.m. to 4:30 p.m. Monday - Friday. Please note that voicemails left after 4:00 p.m. may not be returned until the following business day.  We are closed weekends and major holidays. You have access to a nurse at all times for urgent questions. Please call the main number to the clinic Dept: [phone] and follow the prompts.   For any non-urgent questions, you may also contact your provider using MyChart. We now offer e-Visits for anyone 40 and older to request care online for non-urgent symptoms. For details visit [email or web address].   Also download the MyChart app! Go to the app store, search "MyChart", open the app, select , and log in with your MyChart username and password.  Due to Covid, a mask is required upon entering the hospital/clinic. If you do not have a mask, one will be given to you upon arrival. For doctor visits, patients may have 1 support person aged 51 or older with them. For treatment visits, patients cannot have anyone with them due to current Covid guidelines and our immunocompromised population.   Durvalumab injection What is this medication? DURVALUMAB (dur VAL ue mab) is a monoclonal antibody. It is used to treat lung cancer. This medicine may be used for other purposes; ask your health care provider or pharmacist if you have questions. COMMON BRAND NAME(S): IMFINZI What  should I tell my care team before I take this medication? They need to know if you have any of these conditions: autoimmune diseases like Crohn's disease, ulcerative colitis, or lupus have had or planning to have an allogeneic stem cell transplant (uses someone else's stem cells) history of organ  transplant history of radiation to the chest nervous system problems like myasthenia gravis or Guillain-Barre syndrome an unusual or allergic reaction to durvalumab, other medicines, foods, dyes, or preservatives pregnant or trying to get pregnant breast-feeding How should I use this medication? This medicine is for infusion into a vein. It is given by a health care professional in a hospital or clinic setting. A special MedGuide will be given to you before each treatment. Be sure to read this information carefully each time. Talk to your pediatrician regarding the use of this medicine in children. Special care may be needed. Overdosage: If you think you have taken too much of this medicine contact a poison control center or emergency room at once. NOTE: This medicine is only for you. Do not share this medicine with others. What if I miss a dose? It is important not to miss your dose. Call your doctor or health care professional if you are unable to keep an appointment. What may interact with this medication? Interactions have not been studied. This list may not describe all possible interactions. Give your health care provider a list of all the medicines, herbs, non-prescription drugs, or dietary supplements you use. Also tell them if you smoke, drink alcohol, or use illegal drugs. Some items may interact with your medicine. What should I watch for while using this medication? This medication may make you feel generally unwell. Continue your course of treatment even though you feel ill unless your care team tells you to stop. You may need blood work done while you are taking this medication. Do not become pregnant while taking this medication or for 3 months after stopping it. Women should inform their care team if they wish to become pregnant or think they might be pregnant. There is a potential for serious side effects to an unborn child. Talk to your care team or pharmacist for more  information. Do not breast-feed an infant while taking this medication or for 3 months after stopping it. What side effects may I notice from receiving this medication? Side effects that you should report to your care team as soon as possible: Allergic reactions--skin rash, itching, hives, swelling of the face, lips, tongue, or throat Bloody or watery diarrhea Dizziness, loss of balance or coordination, confusion or trouble speaking Dry cough, shortness of breath or trouble breathing Flushing, mostly over the face, neck, and chest, during injection High blood sugar (hyperglycemia)--increased thirst or amount of urine, unusual weakness or fatigue, blurry vision High thyroid levels (hyperthyroidism)--fast or irregular heartbeat, weight loss, excessive sweating or sensitivity to heat, tremors or shaking, anxiety, nervousness, irregular menstrual cycle or spotting Infection--fever, chills, cough, or sore throat Liver injury--right upper belly pain, loss of appetite, nausea, light-colored stool, dark yellow or brown urine, yellowing skin or eyes, unusual weakness or fatigue Low adrenal gland function--nausea, vomiting, loss of appetite, unusual weakness or fatigue, dizziness, low blood pressure Low thyroid levels (hypothyroidism)--unusual weakness or fatigue, increased sensitivity to cold, constipation, hair loss, dry skin, weight gain, feelings of depression Pancreatitis--severe stomach pain that spreads to your back or gets worse after eating or when touched, fever, nausea, vomiting Rash, fever, and swollen lymph nodes Redness, blistering, peeling or loosening of  the skin, including inside the mouth Wheezing--trouble breathing with loud or whistling sounds Side effects that usually do not require medical attention (report these to your care team if they continue or are bothersome): Fatigue Hair loss This list may not describe all possible side effects. Call your doctor for medical advice about side  effects. You may report side effects to FDA at 1-800-FDA-1088. Where should I keep my medication? This medication is given in a hospital or clinic. It will not be stored at home. NOTE: This sheet is a summary. It may not cover all possible information. If you have questions about this medicine, talk to your doctor, pharmacist, or health care provider.  2022 Elsevier/Gold Standard (2021-06-28 00:00:00)  Carboplatin injection What is this medication? CARBOPLATIN (KAR boe pla tin) is a chemotherapy drug. It targets fast dividing cells, like cancer cells, and causes these cells to die. This medicine is used to treat ovarian cancer and many other cancers. This medicine may be used for other purposes; ask your health care provider or pharmacist if you have questions. COMMON BRAND NAME(S): Paraplatin What should I tell my care team before I take this medication? They need to know if you have any of these conditions: blood disorders hearing problems kidney disease recent or ongoing radiation therapy an unusual or allergic reaction to carboplatin, cisplatin, other chemotherapy, other medicines, foods, dyes, or preservatives pregnant or trying to get pregnant breast-feeding How should I use this medication? This drug is usually given as an infusion into a vein. It is administered in a hospital or clinic by a specially trained health care professional. Talk to your pediatrician regarding the use of this medicine in children. Special care may be needed. Overdosage: If you think you have taken too much of this medicine contact a poison control center or emergency room at once. NOTE: This medicine is only for you. Do not share this medicine with others. What if I miss a dose? It is important not to miss a dose. Call your doctor or health care professional if you are unable to keep an appointment. What may interact with this medication? medicines for seizures medicines to increase blood counts like  filgrastim, pegfilgrastim, sargramostim some antibiotics like amikacin, gentamicin, neomycin, streptomycin, tobramycin vaccines Talk to your doctor or health care professional before taking any of these medicines: acetaminophen aspirin ibuprofen ketoprofen naproxen This list may not describe all possible interactions. Give your health care provider a list of all the medicines, herbs, non-prescription drugs, or dietary supplements you use. Also tell them if you smoke, drink alcohol, or use illegal drugs. Some items may interact with your medicine. What should I watch for while using this medication? Your condition will be monitored carefully while you are receiving this medicine. You will need important blood work done while you are taking this medicine. This drug may make you feel generally unwell. This is not uncommon, as chemotherapy can affect healthy cells as well as cancer cells. Report any side effects. Continue your course of treatment even though you feel ill unless your doctor tells you to stop. In some cases, you may be given additional medicines to help with side effects. Follow all directions for their use. Call your doctor or health care professional for advice if you get a fever, chills or sore throat, or other symptoms of a cold or flu. Do not treat yourself. This drug decreases your body's ability to fight infections. Try to avoid being around people who are sick. This medicine may  increase your risk to bruise or bleed. Call your doctor or health care professional if you notice any unusual bleeding. Be careful brushing and flossing your teeth or using a toothpick because you may get an infection or bleed more easily. If you have any dental work done, tell your dentist you are receiving this medicine. Avoid taking products that contain aspirin, acetaminophen, ibuprofen, naproxen, or ketoprofen unless instructed by your doctor. These medicines may hide a fever. Do not become pregnant  while taking this medicine. Women should inform their doctor if they wish to become pregnant or think they might be pregnant. There is a potential for serious side effects to an unborn child. Talk to your health care professional or pharmacist for more information. Do not breast-feed an infant while taking this medicine. What side effects may I notice from receiving this medication? Side effects that you should report to your doctor or health care professional as soon as possible: allergic reactions like skin rash, itching or hives, swelling of the face, lips, or tongue signs of infection - fever or chills, cough, sore throat, pain or difficulty passing urine signs of decreased platelets or bleeding - bruising, pinpoint red spots on the skin, black, tarry stools, nosebleeds signs of decreased red blood cells - unusually weak or tired, fainting spells, lightheadedness breathing problems changes in hearing changes in vision chest pain high blood pressure low blood counts - This drug may decrease the number of white blood cells, red blood cells and platelets. You may be at increased risk for infections and bleeding. nausea and vomiting pain, swelling, redness or irritation at the injection site pain, tingling, numbness in the hands or feet problems with balance, talking, walking trouble passing urine or change in the amount of urine Side effects that usually do not require medical attention (report to your doctor or health care professional if they continue or are bothersome): hair loss loss of appetite metallic taste in the mouth or changes in taste This list may not describe all possible side effects. Call your doctor for medical advice about side effects. You may report side effects to FDA at 1-800-FDA-1088. Where should I keep my medication? This drug is given in a hospital or clinic and will not be stored at home. NOTE: This sheet is a summary. It may not cover all possible information. If  you have questions about this medicine, talk to your doctor, pharmacist, or health care provider.  2022 Elsevier/Gold Standard (2008-03-18 00:00:00)  Etoposide, VP-16 capsules What is this medication? ETOPOSIDE, VP-16 (e toe POE side) is a chemotherapy drug. It is used to treat small cell lung cancer and other cancers. This medicine may be used for other purposes; ask your health care provider or pharmacist if you have questions. COMMON BRAND NAME(S): VePesid What should I tell my care team before I take this medication? They need to know if you have any of these conditions: infection kidney disease liver disease low blood counts, like low white cell, platelet, or red cell counts an unusual or allergic reaction to etoposide, other medicines, foods, dyes, or preservatives pregnant or trying to get pregnant breast-feeding How should I use this medication? Take this medicine by mouth with a glass of water. Follow the directions on the prescription label. Do not open, crush, or chew the capsules. It is advisable to wear gloves when handling this medicine. Take your medicine at regular intervals. Do not take it more often than directed. Do not stop taking except on your doctor's  advice. Talk to your pediatrician regarding the use of this medicine in children. Special care may be needed. Overdosage: If you think you have taken too much of this medicine contact a poison control center or emergency room at once. NOTE: This medicine is only for you. Do not share this medicine with others. What if I miss a dose? If you miss a dose, take it as soon as you can. If it is almost time for your next dose, take only that dose. Do not take double or extra doses. What may interact with this medication? This medicine may interact with the following medications: cyclosporine warfarin This list may not describe all possible interactions. Give your health care provider a list of all the medicines, herbs,  non-prescription drugs, or dietary supplements you use. Also tell them if you smoke, drink alcohol, or use illegal drugs. Some items may interact with your medicine. What should I watch for while using this medication? Visit your doctor for checks on your progress. This drug may make you feel generally unwell. This is not uncommon, as chemotherapy can affect healthy cells as well as cancer cells. Report any side effects. Continue your course of treatment even though you feel ill unless your doctor tells you to stop. In some cases, you may be given additional medicines to help with side effects. Follow all directions for their use. Call your doctor or health care professional for advice if you get a fever, chills or sore throat, or other symptoms of a cold or flu. Do not treat yourself. This drug decreases your body's ability to fight infections. Try to avoid being around people who are sick. This medicine may increase your risk to bruise or bleed. Call your doctor or health care professional if you notice any unusual bleeding. Talk to your doctor about your risk of cancer. You may be more at risk for certain types of cancers if you take this medicine. Do not become pregnant while taking this medicine or for at least 6 months after stopping it. Women should inform their doctor if they wish to become pregnant or think they might be pregnant. Women of child-bearing potential will need to have a negative pregnancy test before starting this medicine. There is a potential for serious side effects to an unborn child. Talk to your health care professional or pharmacist for more information. Do not breast-feed an infant while taking this medicine. Men must use a latex condom during sexual contact with a woman while taking this medicine and for at least 4 months after stopping it. A latex condom is needed even if you have had a vasectomy. Contact your doctor right away if your partner becomes pregnant. Do not donate  sperm while taking this medicine and for 4 months after you stop taking this medicine. Men should inform their doctors if they wish to father a child. This medicine may lower sperm counts. What side effects may I notice from receiving this medication? Side effects that you should report to your doctor or health care professional as soon as possible: allergic reactions like skin rash, itching or hives, swelling of the face, lips, or tongue low blood counts - this medicine may decrease the number of white blood cells, red blood cells, and platelets. You may be at increased risk for infections and bleeding nausea, vomiting redness, blistering, peeling or loosening of the skin, including inside the mouth signs and symptoms of infection like fever; chills; cough; sore throat; pain or trouble passing urine signs  and symptoms of low red blood cells or anemia such as unusually weak or tired; feeling faint or lightheaded; falls; breathing problems unusual bruising or bleeding Side effects that usually do not require medical attention (report to your doctor or health care professional if they continue or are bothersome): changes in taste diarrhea hair loss loss of appetite mouth sores This list may not describe all possible side effects. Call your doctor for medical advice about side effects. You may report side effects to FDA at 1-800-FDA-1088. Where should I keep my medication? Keep out of the reach of children. Store in a refrigerator between 2 and 8 degrees C (36 and 46 degrees F). Do not freeze. Throw away any unused medicine after the expiration date. NOTE: This sheet is a summary. It may not cover all possible information. If you have questions about this medicine, talk to your doctor, pharmacist, or health care provider.  2022 Elsevier/Gold Standard (2021-06-28 00:00:00)

## 2021-10-12 ENCOUNTER — Inpatient Hospital Stay: Payer: No Typology Code available for payment source

## 2021-10-12 VITALS — BP 122/83 | HR 87 | Temp 97.8°F | Resp 18

## 2021-10-12 DIAGNOSIS — C3481 Malignant neoplasm of overlapping sites of right bronchus and lung: Secondary | ICD-10-CM

## 2021-10-12 DIAGNOSIS — Z5112 Encounter for antineoplastic immunotherapy: Secondary | ICD-10-CM | POA: Diagnosis not present

## 2021-10-12 LAB — ACID FAST CULTURE WITH REFLEXED SENSITIVITIES (MYCOBACTERIA)
Acid Fast Culture: NEGATIVE
Acid Fast Culture: NEGATIVE

## 2021-10-12 MED ORDER — SODIUM CHLORIDE 0.9% FLUSH
10.0000 mL | INTRAVENOUS | Status: DC | PRN
  Administered 2021-10-12: 16:00:00 10 mL

## 2021-10-12 MED ORDER — TRILACICLIB DIHYDROCHLORIDE INJECTION 300 MG
240.0000 mg/m2 | Freq: Once | INTRAVENOUS | Status: AC
  Administered 2021-10-12: 15:00:00 480 mg via INTRAVENOUS
  Filled 2021-10-12: qty 32

## 2021-10-12 MED ORDER — SODIUM CHLORIDE 0.9 % IV SOLN
10.0000 mg | Freq: Once | INTRAVENOUS | Status: AC
  Administered 2021-10-12: 14:00:00 10 mg via INTRAVENOUS
  Filled 2021-10-12: qty 10

## 2021-10-12 MED ORDER — SODIUM CHLORIDE 0.9 % IV SOLN: Freq: Once | INTRAVENOUS | Status: AC

## 2021-10-12 MED ORDER — HEPARIN SOD (PORK) LOCK FLUSH 100 UNIT/ML IV SOLN
500.0000 [IU] | Freq: Once | INTRAVENOUS | Status: AC | PRN
  Administered 2021-10-12: 16:00:00 500 [IU]

## 2021-10-12 MED ORDER — SODIUM CHLORIDE 0.9 % IV SOLN
100.0000 mg/m2 | Freq: Once | INTRAVENOUS | Status: AC
  Administered 2021-10-12: 15:00:00 200 mg via INTRAVENOUS
  Filled 2021-10-12: qty 10

## 2021-10-12 NOTE — Patient Instructions (Signed)
[facility]  Discharge Instructions: Thank you for choosing [facility] to provide your oncology and hematology care.   If you have a lab appointment with the [facility], please go directly to the [facility] and check in at the registration area.   Wear comfortable clothing and clothing appropriate for easy access to any Portacath or PICC line.   We strive to give you quality time with your provider. You may need to reschedule your appointment if you arrive late (15 or more minutes).  Arriving late affects you and other patients whose appointments are after yours.  Also, if you miss three or more appointments without notifying the office, you may be dismissed from the clinic at the providers discretion.      For prescription refill requests, have your pharmacy contact our office and allow 72 hours for refills to be completed.    Today you received the following chemotherapy and/or immunotherapy agents: Cosela and Etoposide      To help prevent nausea and vomiting after your treatment, we encourage you to take your nausea medication as directed.  BELOW ARE SYMPTOMS THAT SHOULD BE REPORTED IMMEDIATELY: *FEVER GREATER THAN 100.4 F (38 C) OR HIGHER *CHILLS OR SWEATING *NAUSEA AND VOMITING THAT IS NOT CONTROLLED WITH YOUR NAUSEA MEDICATION *UNUSUAL SHORTNESS OF BREATH *UNUSUAL BRUISING OR BLEEDING *URINARY PROBLEMS (pain or burning when urinating, or frequent urination) *BOWEL PROBLEMS (unusual diarrhea, constipation, pain near the anus) TENDERNESS IN MOUTH AND THROAT WITH OR WITHOUT PRESENCE OF ULCERS (sore throat, sores in mouth, or a toothache) UNUSUAL RASH, SWELLING OR PAIN  UNUSUAL VAGINAL DISCHARGE OR ITCHING   Items with * indicate a potential emergency and should be followed up as soon as possible or go to the Emergency Department if any problems should occur.  Please show the CHEMOTHERAPY ALERT CARD or IMMUNOTHERAPY ALERT CARD at  check-in to the Emergency Department and triage nurse.  Should you have questions after your visit or need to cancel or reschedule your appointment, please contact [facility]  Dept: [phone]  and follow the prompts.  Office hours are 8:00 a.m. to 4:30 p.m. Monday - Friday. Please note that voicemails left after 4:00 p.m. may not be returned until the following business day.  We are closed weekends and major holidays. You have access to a nurse at all times for urgent questions. Please call the main number to the clinic Dept: [phone] and follow the prompts.   For any non-urgent questions, you may also contact your provider using MyChart. We now offer e-Visits for anyone 17 and older to request care online for non-urgent symptoms. For details visit [email or web address].   Also download the MyChart app! Go to the app store, search "MyChart", open the app, select [facility], and log in with your MyChart username and password.  Due to Covid, a mask is required upon entering the hospital/clinic. If you do not have a mask, one will be given to you upon arrival. For doctor visits, patients may have 1 support person aged 7 or older with them. For treatment visits, patients cannot have anyone with them due to current Covid guidelines and our immunocompromised population.

## 2021-10-12 NOTE — Progress Notes (Signed)
Pt request to not go home accessed, PAC deaccessed for discharge.

## 2021-10-13 ENCOUNTER — Other Ambulatory Visit: Payer: Self-pay

## 2021-10-13 ENCOUNTER — Inpatient Hospital Stay: Payer: No Typology Code available for payment source

## 2021-10-13 VITALS — BP 155/94 | HR 85 | Temp 97.9°F | Resp 18

## 2021-10-13 DIAGNOSIS — C3481 Malignant neoplasm of overlapping sites of right bronchus and lung: Secondary | ICD-10-CM

## 2021-10-13 DIAGNOSIS — Z5112 Encounter for antineoplastic immunotherapy: Secondary | ICD-10-CM | POA: Diagnosis not present

## 2021-10-13 MED ORDER — SODIUM CHLORIDE 0.9 % IV SOLN: Freq: Once | INTRAVENOUS | Status: AC

## 2021-10-13 MED ORDER — TRILACICLIB DIHYDROCHLORIDE INJECTION 300 MG
240.0000 mg/m2 | Freq: Once | INTRAVENOUS | Status: AC
  Administered 2021-10-13: 14:00:00 480 mg via INTRAVENOUS
  Filled 2021-10-13: qty 32

## 2021-10-13 MED ORDER — SODIUM CHLORIDE 0.9 % IV SOLN
100.0000 mg/m2 | Freq: Once | INTRAVENOUS | Status: AC
  Administered 2021-10-13: 15:00:00 200 mg via INTRAVENOUS
  Filled 2021-10-13: qty 10

## 2021-10-13 MED ORDER — DEXAMETHASONE SODIUM PHOSPHATE 100 MG/10ML IJ SOLN
10.0000 mg | Freq: Once | INTRAMUSCULAR | Status: AC
  Administered 2021-10-13: 14:00:00 10 mg via INTRAVENOUS
  Filled 2021-10-13: qty 10

## 2021-10-13 NOTE — Patient Instructions (Signed)
[facility]  Discharge Instructions: Thank you for choosing [facility] to provide your oncology and hematology care.   If you have a lab appointment with the [facility], please go directly to the [facility] and check in at the registration area.   Wear comfortable clothing and clothing appropriate for easy access to any Portacath or PICC line.   We strive to give you quality time with your provider. You may need to reschedule your appointment if you arrive late (15 or more minutes).  Arriving late affects you and other patients whose appointments are after yours.  Also, if you miss three or more appointments without notifying the office, you may be dismissed from the clinic at the providers discretion.      For prescription refill requests, have your pharmacy contact our office and allow 72 hours for refills to be completed.    Today you received the following chemotherapy and/or immunotherapy agents: Cosela and Etoposide      To help prevent nausea and vomiting after your treatment, we encourage you to take your nausea medication as directed.  BELOW ARE SYMPTOMS THAT SHOULD BE REPORTED IMMEDIATELY: *FEVER GREATER THAN 100.4 F (38 C) OR HIGHER *CHILLS OR SWEATING *NAUSEA AND VOMITING THAT IS NOT CONTROLLED WITH YOUR NAUSEA MEDICATION *UNUSUAL SHORTNESS OF BREATH *UNUSUAL BRUISING OR BLEEDING *URINARY PROBLEMS (pain or burning when urinating, or frequent urination) *BOWEL PROBLEMS (unusual diarrhea, constipation, pain near the anus) TENDERNESS IN MOUTH AND THROAT WITH OR WITHOUT PRESENCE OF ULCERS (sore throat, sores in mouth, or a toothache) UNUSUAL RASH, SWELLING OR PAIN  UNUSUAL VAGINAL DISCHARGE OR ITCHING   Items with * indicate a potential emergency and should be followed up as soon as possible or go to the Emergency Department if any problems should occur.  Please show the CHEMOTHERAPY ALERT CARD or IMMUNOTHERAPY ALERT CARD at  check-in to the Emergency Department and triage nurse.  Should you have questions after your visit or need to cancel or reschedule your appointment, please contact [facility]  Dept: [phone]  and follow the prompts.  Office hours are 8:00 a.m. to 4:30 p.m. Monday - Friday. Please note that voicemails left after 4:00 p.m. may not be returned until the following business day.  We are closed weekends and major holidays. You have access to a nurse at all times for urgent questions. Please call the main number to the clinic Dept: [phone] and follow the prompts.   For any non-urgent questions, you may also contact your provider using MyChart. We now offer e-Visits for anyone 73 and older to request care online for non-urgent symptoms. For details visit [email or web address].   Also download the MyChart app! Go to the app store, search "MyChart", open the app, select [facility], and log in with your MyChart username and password.  Due to Covid, a mask is required upon entering the hospital/clinic. If you do not have a mask, one will be given to you upon arrival. For doctor visits, patients may have 1 support person aged 73 or older with them. For treatment visits, patients cannot have anyone with them due to current Covid guidelines and our immunocompromised population.

## 2021-10-18 ENCOUNTER — Inpatient Hospital Stay: Payer: No Typology Code available for payment source

## 2021-10-18 ENCOUNTER — Other Ambulatory Visit: Payer: Self-pay

## 2021-10-18 DIAGNOSIS — Z5112 Encounter for antineoplastic immunotherapy: Secondary | ICD-10-CM | POA: Diagnosis not present

## 2021-10-18 DIAGNOSIS — C3481 Malignant neoplasm of overlapping sites of right bronchus and lung: Secondary | ICD-10-CM

## 2021-10-18 DIAGNOSIS — Z95828 Presence of other vascular implants and grafts: Secondary | ICD-10-CM

## 2021-10-18 LAB — CBC WITH DIFFERENTIAL (CANCER CENTER ONLY)
Abs Immature Granulocytes: 0.04 10*3/uL (ref 0.00–0.07)
Basophils Absolute: 0 10*3/uL (ref 0.0–0.1)
Basophils Relative: 0 %
Eosinophils Absolute: 0 10*3/uL (ref 0.0–0.5)
Eosinophils Relative: 1 %
HCT: 32.5 % — ABNORMAL LOW (ref 39.0–52.0)
Hemoglobin: 12 g/dL — ABNORMAL LOW (ref 13.0–17.0)
Immature Granulocytes: 1 %
Lymphocytes Relative: 39 %
Lymphs Abs: 1.9 10*3/uL (ref 0.7–4.0)
MCH: 31.7 pg (ref 26.0–34.0)
MCHC: 36.9 g/dL — ABNORMAL HIGH (ref 30.0–36.0)
MCV: 85.8 fL (ref 80.0–100.0)
Monocytes Absolute: 0.1 10*3/uL (ref 0.1–1.0)
Monocytes Relative: 3 %
Neutro Abs: 2.8 10*3/uL (ref 1.7–7.7)
Neutrophils Relative %: 56 %
Platelet Count: 123 10*3/uL — ABNORMAL LOW (ref 150–400)
RBC: 3.79 MIL/uL — ABNORMAL LOW (ref 4.22–5.81)
RDW: 12.7 % (ref 11.5–15.5)
WBC Count: 4.8 10*3/uL (ref 4.0–10.5)
nRBC: 0 % (ref 0.0–0.2)

## 2021-10-18 LAB — CMP (CANCER CENTER ONLY)
ALT: 27 U/L (ref 0–44)
AST: 19 U/L (ref 15–41)
Albumin: 3.6 g/dL (ref 3.5–5.0)
Alkaline Phosphatase: 47 U/L (ref 38–126)
Anion gap: 3 — ABNORMAL LOW (ref 5–15)
BUN: 17 mg/dL (ref 8–23)
CO2: 31 mmol/L (ref 22–32)
Calcium: 9.5 mg/dL (ref 8.9–10.3)
Chloride: 101 mmol/L (ref 98–111)
Creatinine: 0.9 mg/dL (ref 0.61–1.24)
GFR, Estimated: >60 mL/min (ref >60)
Glucose, Bld: 109 mg/dL — ABNORMAL HIGH (ref 70–99)
Potassium: 4 mmol/L (ref 3.5–5.1)
Sodium: 135 mmol/L (ref 135–145)
Total Bilirubin: 0.8 mg/dL (ref 0.3–1.2)
Total Protein: 6.1 g/dL — ABNORMAL LOW (ref 6.5–8.1)

## 2021-10-18 MED ORDER — HEPARIN SOD (PORK) LOCK FLUSH 100 UNIT/ML IV SOLN
500.0000 [IU] | Freq: Once | INTRAVENOUS | Status: AC
  Administered 2021-10-18: 12:00:00 500 [IU]

## 2021-10-18 MED ORDER — SODIUM CHLORIDE 0.9% FLUSH
10.0000 mL | Freq: Once | INTRAVENOUS | Status: AC
  Administered 2021-10-18: 12:00:00 10 mL

## 2021-10-25 ENCOUNTER — Inpatient Hospital Stay: Payer: No Typology Code available for payment source

## 2021-10-25 ENCOUNTER — Telehealth: Payer: Self-pay | Admitting: Internal Medicine

## 2021-10-25 ENCOUNTER — Other Ambulatory Visit: Payer: No Typology Code available for payment source

## 2021-10-25 NOTE — Telephone Encounter (Signed)
Cancelled 01/03 appointment due to patient's request, called patient to reschedule cancelled lab appointment. Left a voicemail.

## 2021-10-30 NOTE — Progress Notes (Addendum)
[place] [address]  DIAGNOSIS: Extensive stage (T1b, N3, M1 a) small cell lung cancer presented with right upper lobe nodule in addition to multiple left lung nodule as well as bilateral hilar and mediastinal lymphadenopathy diagnosed in November 2022.  PRIOR THERAPY: None  CURRENT THERAPY: Systemic chemotherapy with carboplatin for AUC of 5 on day 1 and etoposide 100 Mg/M2 on days 1, 2 and 3 with Cosela for Myeloprotection as well as Imfinzi 1500 Mg IV every 3 weeks.  Started September 20, 2021.  Status post 2 cycles  INTERVAL HISTORY: Austin Wells 76 y.o. male returns to the clinic for a follow up visit accompanied by his wife. The patient is feeling well today without any concerning complaints. He The patient continues to tolerate treatment with systemic themotherapy well without any adverse effects. His wife notes that you wouldn't know that he had lung cancer. His wife mentioned her only concern in the interval was that the patient dropped a glass pitcher and stepped on a small shard of glass which caused bleeding out of proportion to the cut. She elevated the patient's leg and used compression and the bleeding stopped. No significant thrombocytopenia on weekly labs. He is not on a blood thinner. No other abnormal bleeding. She is wondering if this is normal. Additionally, he has had suppressed TSH since starting treatment. His PCP would like to do additional lab work. The patient and his wife are wondering if this is ok. Denies any fever, chills, night sweats, or weight loss. He actually has gained weight and reportedly has a strong appetite. Denies any chest pain, cough, or hemoptysis. He reports baseline dyspnea on exertion. Denies any nausea, vomiting, diarrhea, or constipation. Denies any headache or visual changes. Denies any rashes or skin changes. He was supposed to have a repeat CT  prior to this visit but it has not been scheduled. The patient is here today for evaluation prior to starting cycle # 3   MEDICAL HISTORY: Past Medical History:  Diagnosis Date   Bipolar disorder ([facility])    Depression    GERD (gastroesophageal reflux disease)    Hyperlipidemia    Hypertension    Orthostatic hypotension    Sleep apnea    has a cpap and doesn't use it   Syncope and collapse    Tobacco use     ALLERGIES:  has No Known Allergies.  MEDICATIONS:  Current Outpatient Medications  Medication Sig Dispense Refill   ARIPiprazole (ABILIFY) 5 MG tablet Take 5 mg by mouth daily. For mood stabilization and depression     calcium-vitamin D (OSCAL WITH D) 500-200 MG-UNIT tablet Take 1 tablet by mouth daily.     cyanocobalamin 500 MCG tablet Take 500 mcg by mouth daily. Vitamin B12     divalproex (DEPAKOTE) 500 MG DR tablet Take by mouth.     folic acid (FOLVITE) 1 MG tablet Take 1 tablet by mouth daily.     hydrochlorothiazide (HYDRODIURIL) 25 MG tablet Take 12.5 mg by mouth daily.     lidocaine-prilocaine (EMLA) cream Apply to the Port-A-Cath site 30-60-minute before chemotherapy 30 g 0   lisinopril (PRINIVIL,ZESTRIL) 40 MG tablet Take 1 tablet (40 mg total) by mouth daily. For hypertension. (Patient taking differently: Take 40 mg by mouth daily. For blood pressure)     MELATONIN PO Take 1 tablet by mouth at bedtime.     methimazole (TAPAZOLE) 10 MG tablet TAKE  TWO TABLETS BY MOUTH ONCE A DAY     prochlorperazine (COMPAZINE) 10 MG tablet Take 1 tablet (10 mg total) by mouth every 6 (six) hours as needed for nausea or vomiting. 30 tablet 0   propranolol (INDERAL) 10 MG tablet Take 10 mg by mouth 2 (two) times daily.     rosuvastatin (CRESTOR) 40 MG tablet Take 40 mg by mouth daily.     sildenafil (VIAGRA) 100 MG tablet TAKE ONE-HALF TABLET BY MOUTH AS INSTRUCTED (TAKE ONE HOUR PRIOR TO SEXUAL ACTIVITY) *DO NOT EXCEED ONE DOSE IN A 24 HOUR PERIOD*     Tiotropium Bromide-Olodaterol  2.5-2.5 MCG/ACT AERS INHALE 2 PUFFS BY MOUTH DAILY     traZODone (DESYREL) 150 MG tablet Take 150 mg by mouth at bedtime as needed for sleep.     venlafaxine XR (EFFEXOR-XR) 75 MG 24 hr capsule Take 75 mg by mouth daily with breakfast.     No current facility-administered medications for this visit.    SURGICAL HISTORY:  Past Surgical History:  Procedure Laterality Date   BRONCHIAL BIOPSY  08/30/2021   Procedure: BRONCHIAL BIOPSIES;  Surgeon: Garner Nash, DO;  Location: [facility];  Service: Pulmonary;;   BRONCHIAL BRUSHINGS  08/30/2021   Procedure: BRONCHIAL BRUSHINGS;  Surgeon: Garner Nash, DO;  Location: [facility];  Service: Pulmonary;;   BRONCHIAL NEEDLE ASPIRATION BIOPSY  08/30/2021   Procedure: BRONCHIAL NEEDLE ASPIRATION BIOPSIES;  Surgeon: Garner Nash, DO;  Location: [facility];  Service: Pulmonary;;   BRONCHIAL WASHINGS  08/30/2021   Procedure: BRONCHIAL WASHINGS;  Surgeon: Garner Nash, DO;  Location: [facility];  Service: Pulmonary;;   COLONOSCOPY     FINE NEEDLE ASPIRATION  08/30/2021   Procedure: FINE NEEDLE ASPIRATION (FNA) LINEAR;  Surgeon: Garner Nash, DO;  Location: [facility];  Service: Pulmonary;;   HERNIA REPAIR     inquinal   IR IMAGING GUIDED PORT INSERTION  09/26/2021   VIDEO BRONCHOSCOPY WITH ENDOBRONCHIAL NAVIGATION Left 08/30/2021   Procedure: VIDEO BRONCHOSCOPY WITH ENDOBRONCHIAL NAVIGATION;  Surgeon: Garner Nash, DO;  Location: [facility];  Service: Pulmonary;  Laterality: Left;  ION w/ CIOS   VIDEO BRONCHOSCOPY WITH ENDOBRONCHIAL ULTRASOUND Left 08/30/2021   Procedure: VIDEO BRONCHOSCOPY WITH ENDOBRONCHIAL ULTRASOUND;  Surgeon: Garner Nash, DO;  Location: [facility];  Service: Pulmonary;  Laterality: Left;   VIDEO BRONCHOSCOPY WITH RADIAL ENDOBRONCHIAL ULTRASOUND  08/30/2021   Procedure: RADIAL ENDOBRONCHIAL ULTRASOUND;  Surgeon: Garner Nash, DO;  Location: [facility];  Service: Pulmonary;;    REVIEW OF  SYSTEMS:   Review of Systems  Constitutional: Negative for appetite change, chills, fatigue, fever and unexpected weight change.  HENT: Negative for mouth sores, nosebleeds, sore throat and trouble swallowing.   Eyes: Negative for eye problems and icterus.  Respiratory: Positive for baseline dyspnea on exertion. Negative for cough, hemoptysis, and wheezing.   Cardiovascular: Negative for chest pain and leg swelling.  Gastrointestinal: Negative for abdominal pain, constipation, diarrhea, nausea and vomiting.  Genitourinary: Negative for bladder incontinence, difficulty urinating, dysuria, frequency and hematuria.   Musculoskeletal: Negative for back pain, gait problem, neck pain and neck stiffness.  Skin: Negative for itching and rash.  Neurological: Negative for dizziness, extremity weakness, gait problem, headaches, light-headedness and seizures.  Hematological: Negative for adenopathy. Does not bruise/bleed easily except for one episode after stepping on a shard of glass.  Psychiatric/Behavioral: Negative for confusion, depression and sleep disturbance. The patient is not nervous/anxious.     PHYSICAL EXAMINATION:  Blood  pressure 109/83, pulse 65, temperature (!) 96 F (35.6 C), temperature source Tympanic, resp. rate 17, weight 195 lb 6 oz (88.6 kg), SpO2 99 %.  ECOG PERFORMANCE STATUS: 1  Physical Exam  Constitutional: Oriented to person, place, and time and well-developed, well-nourished, and in no distress.  HENT:  Head: Normocephalic and atraumatic.  Mouth/Throat: Oropharynx is clear and moist. No oropharyngeal exudate.  Eyes: Conjunctivae are normal. Right eye exhibits no discharge. Left eye exhibits no discharge. No scleral icterus.  Neck: Normal range of motion. Neck supple.  Cardiovascular: Normal rate, regular rhythm, normal heart sounds and intact distal pulses.   Pulmonary/Chest: Effort normal and breath sounds normal. No respiratory distress. No wheezes. No rales.   Abdominal: Soft. Bowel sounds are normal. Exhibits no distension and no mass. There is no tenderness.  Musculoskeletal: Normal range of motion. Exhibits no edema.  Lymphadenopathy:    No cervical adenopathy.  Neurological: Alert and oriented to person, place, and time. Exhibits normal muscle tone. Gait normal. Coordination normal.  Skin: Skin is warm and dry. No rash noted. Not diaphoretic. No erythema. No pallor.  Psychiatric: Mood, memory and judgment normal.  Vitals reviewed.  LABORATORY DATA: Lab Results  Component Value Date   WBC 3.2 (L) 11/01/2021   HGB 12.1 (L) 11/01/2021   HCT 34.6 (L) 11/01/2021   MCV 88.5 11/01/2021   PLT 155 11/01/2021      Chemistry      Component Value Date/Time   NA 135 11/01/2021 1119   K 4.0 11/01/2021 1119   CL 103 11/01/2021 1119   CO2 29 11/01/2021 1119   BUN 16 11/01/2021 1119   CREATININE 0.96 11/01/2021 1119      Component Value Date/Time   CALCIUM 9.6 11/01/2021 1119   ALKPHOS 48 11/01/2021 1119   AST 15 11/01/2021 1119   ALT 12 11/01/2021 1119   BILITOT 0.7 11/01/2021 1119       RADIOGRAPHIC STUDIES:  No results found.   ASSESSMENT/PLAN:  This is a very pleasant 76 year old Caucasian male diagnosed with extensive stage (T1b, N3, M1 a) small cell lung cancer.  He presented with a right upper lobe lung nodule in addition to multiple left lung nodules and bilateral mediastinal lymphadenopathy.  He was diagnosed in November 2022.  The patient is currently undergoing palliative systemic chemotherapy with carboplatin for an AUC of 5 on day 1, etoposide 100 mg per metered squared on days 1, 2, and 3 and Imfinzi on day 115 mg IV every 3 weeks.  He receives [employer] for myeloprotection.  He is status post 2 cycles.  The patient was supposed to restaging CT scan prior to his appointment but has not been scheduled at this time. I reviewed with the patient and his wife how to go about scheduling this at their convenience. We will review  the results at his next appointment, unless there is urgent findings that need to be discussed earlier.    Labs reviewed with Dr. Julien Nordmann. His total WBC is 3.2. His ANC is 1.2. He is on Cosela for myeloprotection. He denies signs and symptoms of infection. Of course, I reviewed signs and symptoms of infection with the patient and his wife and they were advised to call if he develops any concerning complaints in the interval. Dr. Julien Nordmann recommends that he proceed with cycle #3 today as scheduled.   I checked with the prior authorization team should he require GCSF if his [place] <~0.7 in the interval. I advised the patient to stay  in the clinic about 10 minutes or so after getting weekly labs to ensure that he does not need GCSF support. They were asked to notify our POD nurses.   We will see him back for follow-up visit in 3 weeks for evaluation before starting cycle #4.  Regarding his bleeding after stepping on a small shard of glass, reassured the patient and his wife that he did not have any significant drops in the blood count in the interval to cause the bleeding, reportedly out of proportion to his injury. Denies any other bleeding. We will continue to monitor his labs closely in the interval. Of course, if he has significant bleeding, advised to seek emergency evaluation.   It looks like the patient's TSH has been low even with his baseline labs prior to starting treatment. Recommended he see his PCP who was planning on performing further thyroid evaluation.   The patient was advised to call immediately if he has any concerning symptoms in the interval. The patient voices understanding of current disease status and treatment options and is in agreement with the current care plan. All questions were answered. The patient knows to call the clinic with any problems, questions or concerns. We can certainly see the patient much sooner if necessary     No orders of the defined types were placed in  this encounter.     The total time spent in the appointment was 20-29 minutes.   Hayle Parisi L Jeremy Ditullio, PA-C 11/01/21

## 2021-11-01 ENCOUNTER — Other Ambulatory Visit: Payer: Self-pay

## 2021-11-01 ENCOUNTER — Encounter: Payer: Self-pay | Admitting: Physician Assistant

## 2021-11-01 ENCOUNTER — Inpatient Hospital Stay: Payer: No Typology Code available for payment source | Attending: Internal Medicine

## 2021-11-01 ENCOUNTER — Other Ambulatory Visit: Payer: No Typology Code available for payment source

## 2021-11-01 ENCOUNTER — Inpatient Hospital Stay: Payer: No Typology Code available for payment source

## 2021-11-01 ENCOUNTER — Inpatient Hospital Stay (HOSPITAL_BASED_OUTPATIENT_CLINIC_OR_DEPARTMENT_OTHER): Payer: No Typology Code available for payment source | Admitting: Physician Assistant

## 2021-11-01 VITALS — BP 109/83 | HR 65 | Temp 96.0°F | Resp 17 | Wt 195.4 lb

## 2021-11-01 DIAGNOSIS — D701 Agranulocytosis secondary to cancer chemotherapy: Secondary | ICD-10-CM | POA: Diagnosis not present

## 2021-11-01 DIAGNOSIS — Z5112 Encounter for antineoplastic immunotherapy: Secondary | ICD-10-CM

## 2021-11-01 DIAGNOSIS — Z5111 Encounter for antineoplastic chemotherapy: Secondary | ICD-10-CM | POA: Insufficient documentation

## 2021-11-01 DIAGNOSIS — Z79899 Other long term (current) drug therapy: Secondary | ICD-10-CM | POA: Diagnosis not present

## 2021-11-01 DIAGNOSIS — C3481 Malignant neoplasm of overlapping sites of right bronchus and lung: Secondary | ICD-10-CM

## 2021-11-01 DIAGNOSIS — C3411 Malignant neoplasm of upper lobe, right bronchus or lung: Secondary | ICD-10-CM | POA: Diagnosis present

## 2021-11-01 DIAGNOSIS — Z95828 Presence of other vascular implants and grafts: Secondary | ICD-10-CM

## 2021-11-01 LAB — CBC WITH DIFFERENTIAL (CANCER CENTER ONLY)
Abs Immature Granulocytes: 0.01 10*3/uL (ref 0.00–0.07)
Basophils Absolute: 0 10*3/uL (ref 0.0–0.1)
Basophils Relative: 1 %
Eosinophils Absolute: 0.1 10*3/uL (ref 0.0–0.5)
Eosinophils Relative: 2 %
HCT: 34.6 % — ABNORMAL LOW (ref 39.0–52.0)
Hemoglobin: 12.1 g/dL — ABNORMAL LOW (ref 13.0–17.0)
Immature Granulocytes: 0 %
Lymphocytes Relative: 39 %
Lymphs Abs: 1.3 10*3/uL (ref 0.7–4.0)
MCH: 30.9 pg (ref 26.0–34.0)
MCHC: 35 g/dL (ref 30.0–36.0)
MCV: 88.5 fL (ref 80.0–100.0)
Monocytes Absolute: 0.7 10*3/uL (ref 0.1–1.0)
Monocytes Relative: 22 %
Neutro Abs: 1.2 10*3/uL — ABNORMAL LOW (ref 1.7–7.7)
Neutrophils Relative %: 36 %
Platelet Count: 155 10*3/uL (ref 150–400)
RBC: 3.91 MIL/uL — ABNORMAL LOW (ref 4.22–5.81)
RDW: 14.7 % (ref 11.5–15.5)
WBC Count: 3.2 10*3/uL — ABNORMAL LOW (ref 4.0–10.5)
nRBC: 0 % (ref 0.0–0.2)

## 2021-11-01 LAB — CMP (CANCER CENTER ONLY)
ALT: 12 U/L (ref 0–44)
AST: 15 U/L (ref 15–41)
Albumin: 3.8 g/dL (ref 3.5–5.0)
Alkaline Phosphatase: 48 U/L (ref 38–126)
Anion gap: 3 — ABNORMAL LOW (ref 5–15)
BUN: 16 mg/dL (ref 8–23)
CO2: 29 mmol/L (ref 22–32)
Calcium: 9.6 mg/dL (ref 8.9–10.3)
Chloride: 103 mmol/L (ref 98–111)
Creatinine: 0.96 mg/dL (ref 0.61–1.24)
GFR, Estimated: >60 mL/min (ref >60)
Glucose, Bld: 93 mg/dL (ref 70–99)
Potassium: 4 mmol/L (ref 3.5–5.1)
Sodium: 135 mmol/L (ref 135–145)
Total Bilirubin: 0.7 mg/dL (ref 0.3–1.2)
Total Protein: 6.4 g/dL — ABNORMAL LOW (ref 6.5–8.1)

## 2021-11-01 LAB — TSH: TSH: <0.08 u[IU]/mL — ABNORMAL LOW (ref 0.320–4.118)

## 2021-11-01 MED ORDER — SODIUM CHLORIDE 0.9 % IV SOLN
100.0000 mg/m2 | Freq: Once | INTRAVENOUS | Status: AC
  Administered 2021-11-01: 200 mg via INTRAVENOUS
  Filled 2021-11-01: qty 10

## 2021-11-01 MED ORDER — SODIUM CHLORIDE 0.9% FLUSH
10.0000 mL | Freq: Once | INTRAVENOUS | Status: AC
  Administered 2021-11-01: 10 mL

## 2021-11-01 MED ORDER — SODIUM CHLORIDE 0.9 % IV SOLN
150.0000 mg | Freq: Once | INTRAVENOUS | Status: AC
  Administered 2021-11-01: 150 mg via INTRAVENOUS
  Filled 2021-11-01: qty 150

## 2021-11-01 MED ORDER — SODIUM CHLORIDE 0.9 % IV SOLN: Freq: Once | INTRAVENOUS | Status: AC

## 2021-11-01 MED ORDER — SODIUM CHLORIDE 0.9 % IV SOLN
1500.0000 mg | Freq: Once | INTRAVENOUS | Status: AC
  Administered 2021-11-01: 1500 mg via INTRAVENOUS
  Filled 2021-11-01: qty 30

## 2021-11-01 MED ORDER — TRILACICLIB DIHYDROCHLORIDE INJECTION 300 MG
240.0000 mg/m2 | Freq: Once | INTRAVENOUS | Status: AC
  Administered 2021-11-01: 480 mg via INTRAVENOUS
  Filled 2021-11-01: qty 32

## 2021-11-01 MED ORDER — SODIUM CHLORIDE 0.9 % IV SOLN
10.0000 mg | Freq: Once | INTRAVENOUS | Status: AC
  Administered 2021-11-01: 10 mg via INTRAVENOUS
  Filled 2021-11-01: qty 10

## 2021-11-01 MED ORDER — HEPARIN SOD (PORK) LOCK FLUSH 100 UNIT/ML IV SOLN
500.0000 [IU] | Freq: Once | INTRAVENOUS | Status: AC | PRN
  Administered 2021-11-01: 500 [IU]

## 2021-11-01 MED ORDER — PALONOSETRON HCL INJECTION 0.25 MG/5ML
0.2500 mg | Freq: Once | INTRAVENOUS | Status: AC
  Administered 2021-11-01: 0.25 mg via INTRAVENOUS
  Filled 2021-11-01: qty 5

## 2021-11-01 MED ORDER — SODIUM CHLORIDE 0.9 % IV SOLN
500.0000 mg | Freq: Once | INTRAVENOUS | Status: AC
  Administered 2021-11-01: 500 mg via INTRAVENOUS
  Filled 2021-11-01: qty 50

## 2021-11-01 MED ORDER — SODIUM CHLORIDE 0.9% FLUSH
10.0000 mL | INTRAVENOUS | Status: DC | PRN
  Administered 2021-11-01: 10 mL

## 2021-11-01 NOTE — Patient Instructions (Signed)
[name]   Discharge Instructions: Thank you for choosing [facility] to provide your oncology and hematology care.   If you have a lab appointment with the [facility], please go directly to the [facility] and check in at the registration area.   Wear comfortable clothing and clothing appropriate for easy access to any Portacath or PICC line.   We strive to give you quality time with your provider. You may need to reschedule your appointment if you arrive late (15 or more minutes).  Arriving late affects you and other patients whose appointments are after yours.  Also, if you miss three or more appointments without notifying the office, you may be dismissed from the clinic at the providers discretion.      For prescription refill requests, have your pharmacy contact our office and allow 72 hours for refills to be completed.    Today you received the following chemotherapy and/or immunotherapy agents: durvalumab, carboplatin, etoposide, and cosela      To help prevent nausea and vomiting after your treatment, we encourage you to take your nausea medication as directed.  BELOW ARE SYMPTOMS THAT SHOULD BE REPORTED IMMEDIATELY: *FEVER GREATER THAN 100.4 F (38 C) OR HIGHER *CHILLS OR SWEATING *NAUSEA AND VOMITING THAT IS NOT CONTROLLED WITH YOUR NAUSEA MEDICATION *UNUSUAL SHORTNESS OF BREATH *UNUSUAL BRUISING OR BLEEDING *URINARY PROBLEMS (pain or burning when urinating, or frequent urination) *BOWEL PROBLEMS (unusual diarrhea, constipation, pain near the anus) TENDERNESS IN MOUTH AND THROAT WITH OR WITHOUT PRESENCE OF ULCERS (sore throat, sores in mouth, or a toothache) UNUSUAL RASH, SWELLING OR PAIN  UNUSUAL VAGINAL DISCHARGE OR ITCHING   Items with * indicate a potential emergency and should be followed up as soon as possible or go to the Emergency Department if any problems should occur.  Please show the CHEMOTHERAPY ALERT CARD or  IMMUNOTHERAPY ALERT CARD at check-in to the Emergency Department and triage nurse.  Should you have questions after your visit or need to cancel or reschedule your appointment, please contact [facility]  Dept: [phone]  and follow the prompts.  Office hours are 8:00 a.m. to 4:30 p.m. Monday - Friday. Please note that voicemails left after 4:00 p.m. may not be returned until the following business day.  We are closed weekends and major holidays. You have access to a nurse at all times for urgent questions. Please call the main number to the clinic Dept: [phone] and follow the prompts.   For any non-urgent questions, you may also contact your provider using MyChart. We now offer e-Visits for anyone 39 and older to request care online for non-urgent symptoms. For details visit [email or web address].   Also download the MyChart app! Go to the app store, search "MyChart", open the app, select [facility], and log in with your MyChart username and password.  Due to Covid, a mask is required upon entering the hospital/clinic. If you do not have a mask, one will be given to you upon arrival. For doctor visits, patients may have 1 support person aged 99 or older with them. For treatment visits, patients cannot have anyone with them due to current Covid guidelines and our immunocompromised population.

## 2021-11-01 NOTE — Progress Notes (Signed)
Ok to treat today with low ANC per Cassie Heilingoetter PA-C.

## 2021-11-02 ENCOUNTER — Inpatient Hospital Stay: Payer: No Typology Code available for payment source

## 2021-11-02 VITALS — BP 136/81 | HR 80 | Temp 98.1°F | Resp 18

## 2021-11-02 DIAGNOSIS — C3481 Malignant neoplasm of overlapping sites of right bronchus and lung: Secondary | ICD-10-CM

## 2021-11-02 DIAGNOSIS — Z5112 Encounter for antineoplastic immunotherapy: Secondary | ICD-10-CM | POA: Diagnosis not present

## 2021-11-02 MED ORDER — SODIUM CHLORIDE 0.9 % IV SOLN: Freq: Once | INTRAVENOUS | Status: AC

## 2021-11-02 MED ORDER — SODIUM CHLORIDE 0.9 % IV SOLN
100.0000 mg/m2 | Freq: Once | INTRAVENOUS | Status: AC
  Administered 2021-11-02: 200 mg via INTRAVENOUS
  Filled 2021-11-02: qty 10

## 2021-11-02 MED ORDER — SODIUM CHLORIDE 0.9% FLUSH
10.0000 mL | INTRAVENOUS | Status: DC | PRN
  Administered 2021-11-02: 10 mL

## 2021-11-02 MED ORDER — SODIUM CHLORIDE 0.9 % IV SOLN
10.0000 mg | Freq: Once | INTRAVENOUS | Status: AC
  Administered 2021-11-02: 10 mg via INTRAVENOUS
  Filled 2021-11-02: qty 10

## 2021-11-02 MED ORDER — TRILACICLIB DIHYDROCHLORIDE INJECTION 300 MG
240.0000 mg/m2 | Freq: Once | INTRAVENOUS | Status: AC
  Administered 2021-11-02: 480 mg via INTRAVENOUS
  Filled 2021-11-02: qty 32

## 2021-11-02 MED ORDER — HEPARIN SOD (PORK) LOCK FLUSH 100 UNIT/ML IV SOLN
500.0000 [IU] | Freq: Once | INTRAVENOUS | Status: AC | PRN
  Administered 2021-11-02: 500 [IU]

## 2021-11-02 MED FILL — Dexamethasone Sodium Phosphate Inj 100 MG/10ML: INTRAMUSCULAR | Qty: 1 | Status: AC

## 2021-11-02 NOTE — Progress Notes (Signed)
Pt reports needing prep to drink prior to scheduled CT scan. This RN reviewed with pt how/when toi drink prep, also provided written instructions as well. Pt verbalized understanding

## 2021-11-03 ENCOUNTER — Inpatient Hospital Stay: Payer: No Typology Code available for payment source

## 2021-11-03 ENCOUNTER — Other Ambulatory Visit: Payer: Self-pay

## 2021-11-03 VITALS — BP 158/89 | HR 66 | Temp 97.6°F | Resp 18

## 2021-11-03 DIAGNOSIS — C3481 Malignant neoplasm of overlapping sites of right bronchus and lung: Secondary | ICD-10-CM

## 2021-11-03 DIAGNOSIS — Z5112 Encounter for antineoplastic immunotherapy: Secondary | ICD-10-CM | POA: Diagnosis not present

## 2021-11-03 MED ORDER — SODIUM CHLORIDE 0.9 % IV SOLN: Freq: Once | INTRAVENOUS | Status: AC

## 2021-11-03 MED ORDER — SODIUM CHLORIDE 0.9 % IV SOLN
10.0000 mg | Freq: Once | INTRAVENOUS | Status: AC
  Administered 2021-11-03: 10 mg via INTRAVENOUS
  Filled 2021-11-03: qty 10

## 2021-11-03 MED ORDER — TRILACICLIB DIHYDROCHLORIDE INJECTION 300 MG
240.0000 mg/m2 | Freq: Once | INTRAVENOUS | Status: AC
  Administered 2021-11-03: 480 mg via INTRAVENOUS
  Filled 2021-11-03: qty 32

## 2021-11-03 MED ORDER — SODIUM CHLORIDE 0.9 % IV SOLN
100.0000 mg/m2 | Freq: Once | INTRAVENOUS | Status: AC
  Administered 2021-11-03: 200 mg via INTRAVENOUS
  Filled 2021-11-03: qty 10

## 2021-11-03 MED ORDER — HEPARIN SOD (PORK) LOCK FLUSH 100 UNIT/ML IV SOLN
500.0000 [IU] | Freq: Once | INTRAVENOUS | Status: AC | PRN
  Administered 2021-11-03: 500 [IU]

## 2021-11-03 MED ORDER — SODIUM CHLORIDE 0.9% FLUSH
10.0000 mL | INTRAVENOUS | Status: DC | PRN
  Administered 2021-11-03: 10 mL

## 2021-11-03 NOTE — Patient Instructions (Signed)
[facility]  Discharge Instructions: Thank you for choosing [facility] to provide your oncology and hematology care.   If you have a lab appointment with the [facility], please go directly to the [facility] and check in at the registration area.   Wear comfortable clothing and clothing appropriate for easy access to any Portacath or PICC line.   We strive to give you quality time with your provider. You may need to reschedule your appointment if you arrive late (15 or more minutes).  Arriving late affects you and other patients whose appointments are after yours.  Also, if you miss three or more appointments without notifying the office, you may be dismissed from the clinic at the providers discretion.      For prescription refill requests, have your pharmacy contact our office and allow 72 hours for refills to be completed.    Today you received the following chemotherapy and/or immunotherapy agents Etoposide      To help prevent nausea and vomiting after your treatment, we encourage you to take your nausea medication as directed.  BELOW ARE SYMPTOMS THAT SHOULD BE REPORTED IMMEDIATELY: *FEVER GREATER THAN 100.4 F (38 C) OR HIGHER *CHILLS OR SWEATING *NAUSEA AND VOMITING THAT IS NOT CONTROLLED WITH YOUR NAUSEA MEDICATION *UNUSUAL SHORTNESS OF BREATH *UNUSUAL BRUISING OR BLEEDING *URINARY PROBLEMS (pain or burning when urinating, or frequent urination) *BOWEL PROBLEMS (unusual diarrhea, constipation, pain near the anus) TENDERNESS IN MOUTH AND THROAT WITH OR WITHOUT PRESENCE OF ULCERS (sore throat, sores in mouth, or a toothache) UNUSUAL RASH, SWELLING OR PAIN  UNUSUAL VAGINAL DISCHARGE OR ITCHING   Items with * indicate a potential emergency and should be followed up as soon as possible or go to the Emergency Department if any problems should occur.  Please show the CHEMOTHERAPY ALERT CARD or IMMUNOTHERAPY ALERT CARD at check-in to  the Emergency Department and triage nurse.  Should you have questions after your visit or need to cancel or reschedule your appointment, please contact [facility]  Dept: [phone]  and follow the prompts.  Office hours are 8:00 a.m. to 4:30 p.m. Monday - Friday. Please note that voicemails left after 4:00 p.m. may not be returned until the following business day.  We are closed weekends and major holidays. You have access to a nurse at all times for urgent questions. Please call the main number to the clinic Dept: [phone] and follow the prompts.   For any non-urgent questions, you may also contact your provider using MyChart. We now offer e-Visits for anyone 81 and older to request care online for non-urgent symptoms. For details visit [email or web address].   Also download the MyChart app! Go to the app store, search "MyChart", open the app, select [facility], and log in with your MyChart username and password.  Due to Covid, a mask is required upon entering the hospital/clinic. If you do not have a mask, one will be given to you upon arrival. For doctor visits, patients may have 1 support person aged 59 or older with them. For treatment visits, patients cannot have anyone with them due to current Covid guidelines and our immunocompromised population.

## 2021-11-08 ENCOUNTER — Inpatient Hospital Stay: Payer: No Typology Code available for payment source

## 2021-11-08 ENCOUNTER — Other Ambulatory Visit: Payer: Self-pay

## 2021-11-08 ENCOUNTER — Other Ambulatory Visit: Payer: No Typology Code available for payment source

## 2021-11-08 DIAGNOSIS — Z5112 Encounter for antineoplastic immunotherapy: Secondary | ICD-10-CM | POA: Diagnosis not present

## 2021-11-08 DIAGNOSIS — C3481 Malignant neoplasm of overlapping sites of right bronchus and lung: Secondary | ICD-10-CM

## 2021-11-08 DIAGNOSIS — Z95828 Presence of other vascular implants and grafts: Secondary | ICD-10-CM

## 2021-11-08 LAB — CBC WITH DIFFERENTIAL (CANCER CENTER ONLY)
Abs Immature Granulocytes: 0.04 10*3/uL (ref 0.00–0.07)
Basophils Absolute: 0 10*3/uL (ref 0.0–0.1)
Basophils Relative: 0 %
Eosinophils Absolute: 0.1 10*3/uL (ref 0.0–0.5)
Eosinophils Relative: 1 %
HCT: 33.7 % — ABNORMAL LOW (ref 39.0–52.0)
Hemoglobin: 12.1 g/dL — ABNORMAL LOW (ref 13.0–17.0)
Immature Granulocytes: 1 %
Lymphocytes Relative: 37 %
Lymphs Abs: 1.9 10*3/uL (ref 0.7–4.0)
MCH: 32 pg (ref 26.0–34.0)
MCHC: 35.9 g/dL (ref 30.0–36.0)
MCV: 89.2 fL (ref 80.0–100.0)
Monocytes Absolute: 0.1 10*3/uL (ref 0.1–1.0)
Monocytes Relative: 3 %
Neutro Abs: 3 10*3/uL (ref 1.7–7.7)
Neutrophils Relative %: 58 %
Platelet Count: 148 10*3/uL — ABNORMAL LOW (ref 150–400)
RBC: 3.78 MIL/uL — ABNORMAL LOW (ref 4.22–5.81)
RDW: 14.3 % (ref 11.5–15.5)
WBC Count: 5.2 10*3/uL (ref 4.0–10.5)
nRBC: 0 % (ref 0.0–0.2)

## 2021-11-08 LAB — CMP (CANCER CENTER ONLY)
ALT: 19 U/L (ref 0–44)
AST: 14 U/L — ABNORMAL LOW (ref 15–41)
Albumin: 3.8 g/dL (ref 3.5–5.0)
Alkaline Phosphatase: 53 U/L (ref 38–126)
Anion gap: 4 — ABNORMAL LOW (ref 5–15)
BUN: 16 mg/dL (ref 8–23)
CO2: 29 mmol/L (ref 22–32)
Calcium: 9.6 mg/dL (ref 8.9–10.3)
Chloride: 103 mmol/L (ref 98–111)
Creatinine: 0.72 mg/dL (ref 0.61–1.24)
GFR, Estimated: >60 mL/min (ref >60)
Glucose, Bld: 99 mg/dL (ref 70–99)
Potassium: 4.3 mmol/L (ref 3.5–5.1)
Sodium: 136 mmol/L (ref 135–145)
Total Bilirubin: 0.6 mg/dL (ref 0.3–1.2)
Total Protein: 6.4 g/dL — ABNORMAL LOW (ref 6.5–8.1)

## 2021-11-08 MED ORDER — HEPARIN SOD (PORK) LOCK FLUSH 100 UNIT/ML IV SOLN
500.0000 [IU] | Freq: Once | INTRAVENOUS | Status: AC
  Administered 2021-11-08: 500 [IU]

## 2021-11-08 MED ORDER — SODIUM CHLORIDE 0.9% FLUSH
10.0000 mL | Freq: Once | INTRAVENOUS | Status: AC
  Administered 2021-11-08: 10 mL

## 2021-11-10 ENCOUNTER — Other Ambulatory Visit: Payer: Self-pay

## 2021-11-10 ENCOUNTER — Ambulatory Visit (HOSPITAL_COMMUNITY)
Admission: RE | Admit: 2021-11-10 | Discharge: 2021-11-10 | Disposition: A | Discharge status: Home / Self Care | Payer: No Typology Code available for payment source | Source: Ambulatory Visit | Attending: Internal Medicine | Admitting: Internal Medicine

## 2021-11-10 DIAGNOSIS — C3490 Malignant neoplasm of unspecified part of unspecified bronchus or lung: Secondary | ICD-10-CM | POA: Insufficient documentation

## 2021-11-10 MED ORDER — HEPARIN SOD (PORK) LOCK FLUSH 100 UNIT/ML IV SOLN: 500.0000 [IU] | Freq: Once | INTRAVENOUS | Status: AC

## 2021-11-10 MED ORDER — IOHEXOL 350 MG/ML SOLN: 80.0000 mL | Freq: Once | INTRAVENOUS | Status: DC | PRN

## 2021-11-10 MED ORDER — HEPARIN SOD (PORK) LOCK FLUSH 100 UNIT/ML IV SOLN
INTRAVENOUS | Status: AC
  Administered 2021-11-10: 500 [IU] via INTRAVENOUS
  Filled 2021-11-10: qty 5

## 2021-11-10 MED ORDER — IOHEXOL 300 MG/ML  SOLN
100.0000 mL | Freq: Once | INTRAMUSCULAR | Status: AC | PRN
  Administered 2021-11-10: 100 mL via INTRAVENOUS

## 2021-11-15 ENCOUNTER — Inpatient Hospital Stay: Payer: No Typology Code available for payment source

## 2021-11-22 ENCOUNTER — Inpatient Hospital Stay: Payer: No Typology Code available for payment source

## 2021-11-22 ENCOUNTER — Inpatient Hospital Stay (HOSPITAL_BASED_OUTPATIENT_CLINIC_OR_DEPARTMENT_OTHER): Payer: No Typology Code available for payment source | Admitting: Internal Medicine

## 2021-11-22 ENCOUNTER — Other Ambulatory Visit: Payer: Self-pay

## 2021-11-22 ENCOUNTER — Encounter: Payer: Self-pay | Admitting: *Deleted

## 2021-11-22 ENCOUNTER — Encounter: Payer: Self-pay | Admitting: Internal Medicine

## 2021-11-22 VITALS — BP 109/82

## 2021-11-22 VITALS — HR 74 | Temp 97.6°F | Resp 19 | Ht 71.0 in | Wt 198.7 lb

## 2021-11-22 DIAGNOSIS — Z5112 Encounter for antineoplastic immunotherapy: Secondary | ICD-10-CM

## 2021-11-22 DIAGNOSIS — Z5111 Encounter for antineoplastic chemotherapy: Secondary | ICD-10-CM

## 2021-11-22 DIAGNOSIS — C3481 Malignant neoplasm of overlapping sites of right bronchus and lung: Secondary | ICD-10-CM

## 2021-11-22 DIAGNOSIS — Z95828 Presence of other vascular implants and grafts: Secondary | ICD-10-CM

## 2021-11-22 LAB — CMP (CANCER CENTER ONLY)
ALT: 13 U/L (ref 0–44)
AST: 15 U/L (ref 15–41)
Albumin: 4 g/dL (ref 3.5–5.0)
Alkaline Phosphatase: 47 U/L (ref 38–126)
Anion gap: 3 — ABNORMAL LOW (ref 5–15)
BUN: 12 mg/dL (ref 8–23)
CO2: 28 mmol/L (ref 22–32)
Calcium: 9.5 mg/dL (ref 8.9–10.3)
Chloride: 105 mmol/L (ref 98–111)
Creatinine: 0.81 mg/dL (ref 0.61–1.24)
GFR, Estimated: >60 mL/min (ref >60)
Glucose, Bld: 113 mg/dL — ABNORMAL HIGH (ref 70–99)
Potassium: 3.9 mmol/L (ref 3.5–5.1)
Sodium: 136 mmol/L (ref 135–145)
Total Bilirubin: 0.7 mg/dL (ref 0.3–1.2)
Total Protein: 6.7 g/dL (ref 6.5–8.1)

## 2021-11-22 LAB — CBC WITH DIFFERENTIAL (CANCER CENTER ONLY)
Abs Immature Granulocytes: 0.02 10*3/uL (ref 0.00–0.07)
Basophils Absolute: 0 10*3/uL (ref 0.0–0.1)
Basophils Relative: 1 %
Eosinophils Absolute: 0.1 10*3/uL (ref 0.0–0.5)
Eosinophils Relative: 5 %
HCT: 36.1 % — ABNORMAL LOW (ref 39.0–52.0)
Hemoglobin: 13 g/dL (ref 13.0–17.0)
Immature Granulocytes: 1 %
Lymphocytes Relative: 41 %
Lymphs Abs: 1.3 10*3/uL (ref 0.7–4.0)
MCH: 32.3 pg (ref 26.0–34.0)
MCHC: 36 g/dL (ref 30.0–36.0)
MCV: 89.8 fL (ref 80.0–100.0)
Monocytes Absolute: 0.5 10*3/uL (ref 0.1–1.0)
Monocytes Relative: 17 %
Neutro Abs: 1 10*3/uL — ABNORMAL LOW (ref 1.7–7.7)
Neutrophils Relative %: 35 %
Platelet Count: 134 10*3/uL — ABNORMAL LOW (ref 150–400)
RBC: 4.02 MIL/uL — ABNORMAL LOW (ref 4.22–5.81)
RDW: 14.8 % (ref 11.5–15.5)
WBC Count: 3 10*3/uL — ABNORMAL LOW (ref 4.0–10.5)
nRBC: 0 % (ref 0.0–0.2)

## 2021-11-22 LAB — TSH: TSH: <0.08 u[IU]/mL — ABNORMAL LOW (ref 0.320–4.118)

## 2021-11-22 MED ORDER — SODIUM CHLORIDE 0.9 % IV SOLN: Freq: Once | INTRAVENOUS | Status: AC

## 2021-11-22 MED ORDER — SODIUM CHLORIDE 0.9 % IV SOLN
100.0000 mg/m2 | Freq: Once | INTRAVENOUS | Status: AC
  Administered 2021-11-22: 200 mg via INTRAVENOUS
  Filled 2021-11-22: qty 10

## 2021-11-22 MED ORDER — SODIUM CHLORIDE 0.9 % IV SOLN
1500.0000 mg | Freq: Once | INTRAVENOUS | Status: AC
  Administered 2021-11-22: 1500 mg via INTRAVENOUS
  Filled 2021-11-22: qty 30

## 2021-11-22 MED ORDER — HEPARIN SOD (PORK) LOCK FLUSH 100 UNIT/ML IV SOLN
500.0000 [IU] | Freq: Once | INTRAVENOUS | Status: AC | PRN
  Administered 2021-11-22: 500 [IU]

## 2021-11-22 MED ORDER — SODIUM CHLORIDE 0.9% FLUSH
10.0000 mL | Freq: Once | INTRAVENOUS | Status: AC
  Administered 2021-11-22: 10 mL

## 2021-11-22 MED ORDER — SODIUM CHLORIDE 0.9 % IV SOLN
150.0000 mg | Freq: Once | INTRAVENOUS | Status: AC
  Administered 2021-11-22: 150 mg via INTRAVENOUS
  Filled 2021-11-22: qty 150

## 2021-11-22 MED ORDER — TRILACICLIB DIHYDROCHLORIDE INJECTION 300 MG
240.0000 mg/m2 | Freq: Once | INTRAVENOUS | Status: AC
  Administered 2021-11-22: 480 mg via INTRAVENOUS
  Filled 2021-11-22: qty 32

## 2021-11-22 MED ORDER — SODIUM CHLORIDE 0.9 % IV SOLN
495.0000 mg | Freq: Once | INTRAVENOUS | Status: AC
  Administered 2021-11-22: 500 mg via INTRAVENOUS
  Filled 2021-11-22: qty 50

## 2021-11-22 MED ORDER — SODIUM CHLORIDE 0.9 % IV SOLN
10.0000 mg | Freq: Once | INTRAVENOUS | Status: AC
  Administered 2021-11-22: 10 mg via INTRAVENOUS
  Filled 2021-11-22: qty 10

## 2021-11-22 MED ORDER — SODIUM CHLORIDE 0.9% FLUSH
10.0000 mL | INTRAVENOUS | Status: DC | PRN
  Administered 2021-11-22: 10 mL

## 2021-11-22 MED ORDER — PALONOSETRON HCL INJECTION 0.25 MG/5ML
0.2500 mg | Freq: Once | INTRAVENOUS | Status: AC
  Administered 2021-11-22: 0.25 mg via INTRAVENOUS
  Filled 2021-11-22: qty 5

## 2021-11-22 NOTE — Progress Notes (Signed)
[facility] Telephone:(336) [phone]   Fax:(336) [place] [address]  DIAGNOSIS: Extensive stage (T1b, N3, M1 a) small cell lung cancer presented with right upper lobe nodule in addition to multiple left lung nodule as well as bilateral hilar and mediastinal lymphadenopathy diagnosed in November 2022.  PRIOR THERAPY: None  CURRENT THERAPY: Systemic chemotherapy with carboplatin for AUC of 5 on day 1 and etoposide 100 Mg/M2 on days 1, 2 and 3 with Cosela for Myeloprotection as well as Imfinzi 1500 Mg IV every 3 weeks.  Started September 20, 2021.  Status post 3 cycles.   INTERVAL HISTORY: Austin Wells 76 y.o. male returns to the clinic today for follow-up visit.  The patient is feeling fine today with no concerning complaints except for mild fatigue.  He has been tolerating his systemic chemotherapy fairly well.  He denied having any current chest pain, shortness of breath, cough or hemoptysis.  He has no nausea, vomiting, diarrhea or constipation.  He has no headache or visual changes.  He denied having any significant weight loss or night sweats.  He is actually gaining weight.  He had repeat CT scan of the chest, abdomen pelvis performed recently and he is here today for evaluation before starting cycle #4.   MEDICAL HISTORY: Past Medical History:  Diagnosis Date   Bipolar disorder ([place])    Depression    GERD (gastroesophageal reflux disease)    Hyperlipidemia    Hypertension    Orthostatic hypotension    Sleep apnea    has a cpap and doesn't use it   Syncope and collapse    Tobacco use     ALLERGIES:  has No Known Allergies.  MEDICATIONS:  Current Outpatient Medications  Medication Sig Dispense Refill   ARIPiprazole (ABILIFY) 5 MG tablet Take 5 mg by mouth daily. For mood stabilization and depression     calcium-vitamin D (OSCAL WITH D) 500-200 MG-UNIT tablet Take  1 tablet by mouth daily.     cyanocobalamin 500 MCG tablet Take 500 mcg by mouth daily. Vitamin B12     divalproex (DEPAKOTE) 500 MG DR tablet Take by mouth.     folic acid (FOLVITE) 1 MG tablet Take 1 tablet by mouth daily.     hydrochlorothiazide (HYDRODIURIL) 25 MG tablet Take 12.5 mg by mouth daily.     lidocaine-prilocaine (EMLA) cream Apply to the Port-A-Cath site 30-60-minute before chemotherapy 30 g 0   lisinopril (PRINIVIL,ZESTRIL) 40 MG tablet Take 1 tablet (40 mg total) by mouth daily. For hypertension. (Patient taking differently: Take 40 mg by mouth daily. For blood pressure)     MELATONIN PO Take 1 tablet by mouth at bedtime.     methimazole (TAPAZOLE) 10 MG tablet TAKE TWO TABLETS BY MOUTH ONCE A DAY     prochlorperazine (COMPAZINE) 10 MG tablet Take 1 tablet (10 mg total) by mouth every 6 (six) hours as needed for nausea or vomiting. 30 tablet 0   propranolol (INDERAL) 10 MG tablet Take 10 mg by mouth 2 (two) times daily.     rosuvastatin (CRESTOR) 40 MG tablet Take 40 mg by mouth daily.     sildenafil (VIAGRA) 100 MG tablet TAKE ONE-HALF TABLET BY MOUTH AS INSTRUCTED (TAKE ONE HOUR PRIOR TO SEXUAL ACTIVITY) *DO NOT EXCEED ONE DOSE IN A 24 HOUR PERIOD*     Tiotropium Bromide-Olodaterol 2.5-2.5 MCG/ACT AERS INHALE 2 PUFFS BY MOUTH  DAILY     traZODone (DESYREL) 150 MG tablet Take 150 mg by mouth at bedtime as needed for sleep.     venlafaxine XR (EFFEXOR-XR) 75 MG 24 hr capsule Take 75 mg by mouth daily with breakfast.     No current facility-administered medications for this visit.    SURGICAL HISTORY:  Past Surgical History:  Procedure Laterality Date   BRONCHIAL BIOPSY  08/30/2021   Procedure: BRONCHIAL BIOPSIES;  Surgeon: Garner Nash, DO;  Location: [facility];  Service: Pulmonary;;   BRONCHIAL BRUSHINGS  08/30/2021   Procedure: BRONCHIAL BRUSHINGS;  Surgeon: Garner Nash, DO;  Location: [facility];  Service: Pulmonary;;   BRONCHIAL NEEDLE ASPIRATION BIOPSY   08/30/2021   Procedure: BRONCHIAL NEEDLE ASPIRATION BIOPSIES;  Surgeon: Garner Nash, DO;  Location: [facility];  Service: Pulmonary;;   BRONCHIAL WASHINGS  08/30/2021   Procedure: BRONCHIAL WASHINGS;  Surgeon: Garner Nash, DO;  Location: [facility];  Service: Pulmonary;;   COLONOSCOPY     FINE NEEDLE ASPIRATION  08/30/2021   Procedure: FINE NEEDLE ASPIRATION (FNA) LINEAR;  Surgeon: Garner Nash, DO;  Location: [facility];  Service: Pulmonary;;   HERNIA REPAIR     inquinal   IR IMAGING GUIDED PORT INSERTION  09/26/2021   VIDEO BRONCHOSCOPY WITH ENDOBRONCHIAL NAVIGATION Left 08/30/2021   Procedure: VIDEO BRONCHOSCOPY WITH ENDOBRONCHIAL NAVIGATION;  Surgeon: Garner Nash, DO;  Location: [facility];  Service: Pulmonary;  Laterality: Left;  ION w/ CIOS   VIDEO BRONCHOSCOPY WITH ENDOBRONCHIAL ULTRASOUND Left 08/30/2021   Procedure: VIDEO BRONCHOSCOPY WITH ENDOBRONCHIAL ULTRASOUND;  Surgeon: Garner Nash, DO;  Location: [facility];  Service: Pulmonary;  Laterality: Left;   VIDEO BRONCHOSCOPY WITH RADIAL ENDOBRONCHIAL ULTRASOUND  08/30/2021   Procedure: RADIAL ENDOBRONCHIAL ULTRASOUND;  Surgeon: Garner Nash, DO;  Location: [facility];  Service: Pulmonary;;    REVIEW OF SYSTEMS:  Constitutional: positive for fatigue Eyes: negative Ears, nose, mouth, throat, and face: negative Respiratory: negative Cardiovascular: negative Gastrointestinal: negative Genitourinary:negative Integument/breast: negative Hematologic/lymphatic: negative Musculoskeletal:negative Neurological: negative Behavioral/Psych: negative Endocrine: negative Allergic/Immunologic: negative   PHYSICAL EXAMINATION: General appearance: alert, cooperative, fatigued, and no distress Head: Normocephalic, without obvious abnormality, atraumatic Neck: no adenopathy, no JVD, supple, symmetrical, trachea midline, and thyroid not enlarged, symmetric, no tenderness/mass/nodules Lymph nodes: Cervical,  supraclavicular, and axillary nodes normal. Resp: clear to auscultation bilaterally Back: symmetric, no curvature. ROM normal. No CVA tenderness. Cardio: regular rate and rhythm, S1, S2 normal, no murmur, click, rub or gallop GI: soft, non-tender; bowel sounds normal; no masses,  no organomegaly Extremities: extremities normal, atraumatic, no cyanosis or edema Neurologic: Alert and oriented X 3, normal strength and tone. Normal symmetric reflexes. Normal coordination and gait  ECOG PERFORMANCE STATUS: 1 - Symptomatic but completely ambulatory  Pulse 74, temperature 97.6 F (36.4 C), temperature source Tympanic, resp. rate 19, height 5\' 11"  (1.803 m), weight 198 lb 11.2 oz (90.1 kg), SpO2 99 %.  LABORATORY DATA: Lab Results  Component Value Date   WBC 3.0 (L) 11/22/2021   HGB 13.0 11/22/2021   HCT 36.1 (L) 11/22/2021   MCV 89.8 11/22/2021   PLT 134 (L) 11/22/2021      Chemistry      Component Value Date/Time   NA 136 11/08/2021 1229   K 4.3 11/08/2021 1229   CL 103 11/08/2021 1229   CO2 29 11/08/2021 1229   BUN 16 11/08/2021 1229   CREATININE 0.72 11/08/2021 1229      Component Value Date/Time   CALCIUM  9.6 11/08/2021 1229   ALKPHOS 53 11/08/2021 1229   AST 14 (L) 11/08/2021 1229   ALT 19 11/08/2021 1229   BILITOT 0.6 11/08/2021 1229       RADIOGRAPHIC STUDIES: CT Chest W Contrast  Result Date: 11/11/2021 CLINICAL DATA:  Primary Cancer Type: Lung Imaging Indication: Assess response to therapy Interval therapy since last imaging? Yes Initial Cancer Diagnosis Date: 08/30/2021; Established by: Biopsy-proven Detailed Pathology: Extensive stage small cell lung cancer. Primary Tumor location: Right upper lobe nodule and multiple left lung nodules. Surgeries: No. Chemotherapy: Yes; Ongoing? Yes; Most recent administration: 11/01/2021 Immunotherapy?  Yes; Type: Imfinzi; Ongoing? Yes Radiation therapy? No EXAM: CT CHEST, ABDOMEN, AND PELVIS WITH CONTRAST TECHNIQUE: Multidetector  CT imaging of the chest, abdomen and pelvis was performed following the standard protocol during bolus administration of intravenous contrast. RADIATION DOSE REDUCTION: This exam was performed according to the departmental dose-optimization program which includes automated exposure control, adjustment of the mA and/or kV according to patient size and/or use of iterative reconstruction technique. CONTRAST:  <See Chart> OMNIPAQUE IOHEXOL 350 MG/ML SOLN, 158mL OMNIPAQUE IOHEXOL 300 MG/ML SOLN COMPARISON:  Most recent CT chest 07/15/2021.  06/10/2021 PET-CT. FINDINGS: CT CHEST FINDINGS Cardiovascular: Right Port-A-Cath tip high right atrium. Aortic atherosclerosis. Normal heart size, without pericardial effusion. Aortic valve calcification. Three vessel coronary artery calcification. No central pulmonary embolism, on this non-dedicated study. Mediastinum/Nodes: No supraclavicular adenopathy. 1.2 cm right paratracheal node on 22/2, similar to 1.3 cm on the prior. Subcarinal node measures 11 mm on 26/2 and is not significantly changed. No hilar adenopathy. An AP window node measures 9 mm on 25/2 versus 12 mm on the prior exam. Lungs/Pleura: No pleural fluid. Moderate centrilobular emphysema and similar interstitial lung disease, as evidenced by subpleural reticulation and architectural distortion without craniocaudal gradient. pleural-based right upper lobe pulmonary nodule measures 8 x 9 mm on 50/6 and is similar to on the prior exam (when remeasured). Significant decrease in left lower lobe pleural-based nodules. Example at 6 x 5 mm on 81/6 versus 2.5 x 1.2 cm on the prior diagnostic chest CT. The adjacent immediately posterior pleural-based nodule measures 6 mm on 85/6 versus 9 mm on the prior. Musculoskeletal: No acute osseous abnormality. T11 mild superior endplate compression deformity is similar. CT ABDOMEN PELVIS FINDINGS Hepatobiliary: Normal liver. 3.0 cm stone in the gallbladder fundus. No acute cholecystitis  or biliary duct dilatation. Pancreas: Normal, without mass or ductal dilatation. Spleen: Normal in size, without focal abnormality. Adrenals/Urinary Tract: Normal adrenal glands. interpolar right renal subcentimeter exophytic cyst. Bilateral too small to characterize renal lesions. Mild median lobe prostate impression into the urinary bladder. Stomach/Bowel: Normal stomach, without wall thickening. Colonic stool burden suggests constipation. Normal terminal ileum. Normal small bowel. Vascular/Lymphatic: Aortic atherosclerosis. No abdominopelvic adenopathy. Reproductive: Mild prostatomegaly. Other: No significant free fluid. No evidence of omental or peritoneal disease. Musculoskeletal: Degenerate disc disease at the lumbosacral junction. IMPRESSION: CT CHEST IMPRESSION 1. The left lower lobe pulmonary nodules have decreased in size, suggesting response to therapy. 2. The right upper lobe pulmonary nodule is not significantly changed. 3. Primarily similar mediastinal nodal size. An AP window node has mildly decreased in size. 4. No new or progressive disease. 5. Interstitial lung disease, favoring nonspecific interstitial pneumonitis. 6. Aortic valvular calcifications. Consider echocardiography to evaluate for valvular dysfunction. 7. Aortic atherosclerosis (ICD10-I70.0), coronary artery atherosclerosis and emphysema (ICD10-J43.9). CT ABDOMEN AND PELVIS IMPRESSION 1. No acute process or evidence of metastatic disease in the abdomen or pelvis. 2. Cholelithiasis. Electronically Signed  By: Abigail Miyamoto M.D.   On: 11/11/2021 10:55   CT Abdomen Pelvis W Contrast  Result Date: 11/11/2021 CLINICAL DATA:  Primary Cancer Type: Lung Imaging Indication: Assess response to therapy Interval therapy since last imaging? Yes Initial Cancer Diagnosis Date: 08/30/2021; Established by: Biopsy-proven Detailed Pathology: Extensive stage small cell lung cancer. Primary Tumor location: Right upper lobe nodule and multiple left lung  nodules. Surgeries: No. Chemotherapy: Yes; Ongoing? Yes; Most recent administration: 11/01/2021 Immunotherapy?  Yes; Type: Imfinzi; Ongoing? Yes Radiation therapy? No EXAM: CT CHEST, ABDOMEN, AND PELVIS WITH CONTRAST TECHNIQUE: Multidetector CT imaging of the chest, abdomen and pelvis was performed following the standard protocol during bolus administration of intravenous contrast. RADIATION DOSE REDUCTION: This exam was performed according to the departmental dose-optimization program which includes automated exposure control, adjustment of the mA and/or kV according to patient size and/or use of iterative reconstruction technique. CONTRAST:  <See Chart> OMNIPAQUE IOHEXOL 350 MG/ML SOLN, 116mL OMNIPAQUE IOHEXOL 300 MG/ML SOLN COMPARISON:  Most recent CT chest 07/15/2021.  06/10/2021 PET-CT. FINDINGS: CT CHEST FINDINGS Cardiovascular: Right Port-A-Cath tip high right atrium. Aortic atherosclerosis. Normal heart size, without pericardial effusion. Aortic valve calcification. Three vessel coronary artery calcification. No central pulmonary embolism, on this non-dedicated study. Mediastinum/Nodes: No supraclavicular adenopathy. 1.2 cm right paratracheal node on 22/2, similar to 1.3 cm on the prior. Subcarinal node measures 11 mm on 26/2 and is not significantly changed. No hilar adenopathy. An AP window node measures 9 mm on 25/2 versus 12 mm on the prior exam. Lungs/Pleura: No pleural fluid. Moderate centrilobular emphysema and similar interstitial lung disease, as evidenced by subpleural reticulation and architectural distortion without craniocaudal gradient. pleural-based right upper lobe pulmonary nodule measures 8 x 9 mm on 50/6 and is similar to on the prior exam (when remeasured). Significant decrease in left lower lobe pleural-based nodules. Example at 6 x 5 mm on 81/6 versus 2.5 x 1.2 cm on the prior diagnostic chest CT. The adjacent immediately posterior pleural-based nodule measures 6 mm on 85/6 versus 9 mm  on the prior. Musculoskeletal: No acute osseous abnormality. T11 mild superior endplate compression deformity is similar. CT ABDOMEN PELVIS FINDINGS Hepatobiliary: Normal liver. 3.0 cm stone in the gallbladder fundus. No acute cholecystitis or biliary duct dilatation. Pancreas: Normal, without mass or ductal dilatation. Spleen: Normal in size, without focal abnormality. Adrenals/Urinary Tract: Normal adrenal glands. interpolar right renal subcentimeter exophytic cyst. Bilateral too small to characterize renal lesions. Mild median lobe prostate impression into the urinary bladder. Stomach/Bowel: Normal stomach, without wall thickening. Colonic stool burden suggests constipation. Normal terminal ileum. Normal small bowel. Vascular/Lymphatic: Aortic atherosclerosis. No abdominopelvic adenopathy. Reproductive: Mild prostatomegaly. Other: No significant free fluid. No evidence of omental or peritoneal disease. Musculoskeletal: Degenerate disc disease at the lumbosacral junction. IMPRESSION: CT CHEST IMPRESSION 1. The left lower lobe pulmonary nodules have decreased in size, suggesting response to therapy. 2. The right upper lobe pulmonary nodule is not significantly changed. 3. Primarily similar mediastinal nodal size. An AP window node has mildly decreased in size. 4. No new or progressive disease. 5. Interstitial lung disease, favoring nonspecific interstitial pneumonitis. 6. Aortic valvular calcifications. Consider echocardiography to evaluate for valvular dysfunction. 7. Aortic atherosclerosis (ICD10-I70.0), coronary artery atherosclerosis and emphysema (ICD10-J43.9). CT ABDOMEN AND PELVIS IMPRESSION 1. No acute process or evidence of metastatic disease in the abdomen or pelvis. 2. Cholelithiasis. Electronically Signed   By: Abigail Miyamoto M.D.   On: 11/11/2021 10:55    ASSESSMENT AND PLAN: This is a very pleasant  76 years old white male diagnosed with extensive stage (T1b, N3, M1 a) small cell lung cancer presented  with right upper lobe lung nodule in addition to multiple left lung nodules and bilateral and mediastinal lymphadenopathy diagnosed in November 2022. The patient is currently undergoing systemic chemotherapy with carboplatin for AUC of 5 on day 1, etoposide 100 Mg/M2 on days 1, 2 and 3 with Cosela 240 Mg/M2 for Myeloprotection before chemotherapy as well as Imfinzi 1500 Mg IV every 3 weeks with chemo.  He is status post 3 cycle of treatment. The patient has been tolerating his treatment with systemic chemotherapy fairly well with no concerning adverse effects except for mild fatigue. He had repeat CT scan of the chest, abdomen pelvis performed recently.  I personally and independently reviewed the scans and discussed the results with the patient and his wife. His scan showed improvement of his disease with decrease in the left lower lobe pulmonary nodules as well as the mediastinal lymph nodes. I recommended for the patient to continue his treatment with the same regimen and he will proceed with cycle #4 today. He will come back for follow-up visit in 3 weeks for evaluation before starting the first cycle of his maintenance therapy with single agent Imfinzi. For the chemotherapy-induced neutropenia, I will arrange for the patient to receive Neulasta injection after this cycle. The patient was advised to call immediately if he has any other concerning symptoms in the interval. All questions were answered. The patient knows to call the clinic with any problems, questions or concerns. We can certainly see the patient much sooner if necessary.  Disclaimer: This note was dictated with voice recognition software. Similar sounding words can inadvertently be transcribed and may not be corrected upon review.

## 2021-11-22 NOTE — Patient Instructions (Signed)
[facility]   Discharge Instructions: Thank you for choosing [facility] to provide your oncology and hematology care.   If you have a lab appointment with the [facility], please go directly to the [facility] and check in at the registration area.   Wear comfortable clothing and clothing appropriate for easy access to any Portacath or PICC line.   We strive to give you quality time with your provider. You may need to reschedule your appointment if you arrive late (15 or more minutes).  Arriving late affects you and other patients whose appointments are after yours.  Also, if you miss three or more appointments without notifying the office, you may be dismissed from the clinic at the providers discretion.      For prescription refill requests, have your pharmacy contact our office and allow 72 hours for refills to be completed.    Today you received the following chemotherapy and/or immunotherapy agents: cosela, durvalumab, carboplatin, etoposide.      To help prevent nausea and vomiting after your treatment, we encourage you to take your nausea medication as directed.  BELOW ARE SYMPTOMS THAT SHOULD BE REPORTED IMMEDIATELY: *FEVER GREATER THAN 100.4 F (38 C) OR HIGHER *CHILLS OR SWEATING *NAUSEA AND VOMITING THAT IS NOT CONTROLLED WITH YOUR NAUSEA MEDICATION *UNUSUAL SHORTNESS OF BREATH *UNUSUAL BRUISING OR BLEEDING *URINARY PROBLEMS (pain or burning when urinating, or frequent urination) *BOWEL PROBLEMS (unusual diarrhea, constipation, pain near the anus) TENDERNESS IN MOUTH AND THROAT WITH OR WITHOUT PRESENCE OF ULCERS (sore throat, sores in mouth, or a toothache) UNUSUAL RASH, SWELLING OR PAIN  UNUSUAL VAGINAL DISCHARGE OR ITCHING   Items with * indicate a potential emergency and should be followed up as soon as possible or go to the Emergency Department if any problems should occur.  Please show the CHEMOTHERAPY ALERT CARD or  IMMUNOTHERAPY ALERT CARD at check-in to the Emergency Department and triage nurse.  Should you have questions after your visit or need to cancel or reschedule your appointment, please contact [facility]  Dept: [phone]  and follow the prompts.  Office hours are 8:00 a.m. to 4:30 p.m. Monday - Friday. Please note that voicemails left after 4:00 p.m. may not be returned until the following business day.  We are closed weekends and major holidays. You have access to a nurse at all times for urgent questions. Please call the main number to the clinic Dept: [phone] and follow the prompts.   For any non-urgent questions, you may also contact your provider using MyChart. We now offer e-Visits for anyone 76 and older to request care online for non-urgent symptoms. For details visit [email or web address].   Also download the MyChart app! Go to the app store, search "MyChart", open the app, select [facility], and log in with your MyChart username and password.  Due to Covid, a mask is required upon entering the hospital/clinic. If you do not have a mask, one will be given to you upon arrival. For doctor visits, patients may have 1 support person aged 35 or older with them. For treatment visits, patients cannot have anyone with them due to current Covid guidelines and our immunocompromised population.

## 2021-11-22 NOTE — Progress Notes (Signed)
OK to treat w/ ANC = 1 per Dr. Julien Nordmann. [place] added for Day [place], [place].D., CPP 11/22/2021@1 :08 PM

## 2021-11-22 NOTE — Progress Notes (Signed)
Oncology Nurse Navigator Documentation  Oncology Nurse Navigator Flowsheets 11/22/2021 09/27/2021 09/19/2021 09/14/2021 09/12/2021 09/09/2021 09/09/2021  Abnormal Finding Date - - - - - - 06/11/2021  Confirmed Diagnosis Date - 08/30/2021 - - - - 08/30/2021  Diagnosis Status - Confirmed Diagnosis Complete - - - - Confirmed Diagnosis Complete  Planned Course of Treatment - Chemotherapy;Targeted Therapy - - - - Chemotherapy;Targeted Therapy  Phase of Treatment - Chemo - Targeted Therapy Targeted Therapy - Targeted Therapy  Chemotherapy Actual Start Date: - - - 09/20/2021 - - -  Targeted Therapy Actual Start Date: - - - 09/20/2021 - - -  Navigator Follow Up Date: 12/12/2021 10/11/2021 - 09/27/2021 09/13/2021 - 09/13/2021  Navigator Follow Up Reason: Appointment Review Appointment Review - Follow-up Appointment Appointment Review - Appointment Review  Navigator Location [facility]  Navigator Encounter Type Clinic/MDC;Follow-up Appt Clinic/MDC;Follow-up Appt Telephone Other: Other: Other: Clinic/MDC;Initial MedOnc  Telephone - - Outgoing Call - - - -  Treatment Initiated Date - - - 09/20/2021 - - -  Patient Visit Type MedOnc MedOnc - Other - - Initial;MedOnc  Treatment Phase Treatment Treatment Pre-Tx/Tx Discussion - - - Pre-Tx/Tx Discussion  Barriers/Navigation Needs Education Education Coordination of Care;Education Coordination of Care Coordination of Care Coordination of Care Coordination of Care;Education  Education Smoking cessation/I spoke to patient and his wife today.  He is doing well and has gained weight since his last visit. He is still smoking and I spoke to him about cessation.   Other Other - - - Other;Smoking cessation;Newly Diagnosed Cancer Education;Understanding Cancer/ Treatment Options  Interventions Education;Psycho-Social Support Education;Psycho-Social Support Coordination of  Care;Psycho-Social Support;Education Coordination of Care Coordination of Care Coordination of Care Coordination of Care;Education;Psycho-Social Support  Acuity Level 3-Moderate Needs (3-4 Barriers Identified) Level 3-Moderate Needs (3-4 Barriers Identified) Level 2-Minimal Needs (1-2 Barriers Identified) Level 2-Minimal Needs (1-2 Barriers Identified) Level 2-Minimal Needs (1-2 Barriers Identified) Level 2-Minimal Needs (1-2 Barriers Identified) Level 2-Minimal Needs (1-2 Barriers Identified)  Coordination of Care - - Other Radiology;Other Appts Other Other  Education Method Verbal Verbal Other - - - Verbal;Written  Time Spent with Patient 30 30 60 30 30 30  30

## 2021-11-22 NOTE — Progress Notes (Signed)
Pt's anc was 1.0. Dr. Julien Nordmann gave the OK to continue with treatment. Infusion nurse was notified.

## 2021-11-23 ENCOUNTER — Inpatient Hospital Stay: Payer: No Typology Code available for payment source | Attending: Internal Medicine

## 2021-11-23 VITALS — BP 108/78 | HR 72 | Temp 98.2°F | Resp 18

## 2021-11-23 DIAGNOSIS — C3481 Malignant neoplasm of overlapping sites of right bronchus and lung: Secondary | ICD-10-CM

## 2021-11-23 DIAGNOSIS — Z79899 Other long term (current) drug therapy: Secondary | ICD-10-CM | POA: Insufficient documentation

## 2021-11-23 DIAGNOSIS — Z5189 Encounter for other specified aftercare: Secondary | ICD-10-CM | POA: Insufficient documentation

## 2021-11-23 DIAGNOSIS — Z5112 Encounter for antineoplastic immunotherapy: Secondary | ICD-10-CM | POA: Diagnosis present

## 2021-11-23 DIAGNOSIS — C3411 Malignant neoplasm of upper lobe, right bronchus or lung: Secondary | ICD-10-CM | POA: Diagnosis present

## 2021-11-23 DIAGNOSIS — Z5111 Encounter for antineoplastic chemotherapy: Secondary | ICD-10-CM | POA: Insufficient documentation

## 2021-11-23 MED ORDER — SODIUM CHLORIDE 0.9 % IV SOLN: Freq: Once | INTRAVENOUS | Status: AC

## 2021-11-23 MED ORDER — SODIUM CHLORIDE 0.9 % IV SOLN
10.0000 mg | Freq: Once | INTRAVENOUS | Status: AC
  Administered 2021-11-23: 10 mg via INTRAVENOUS
  Filled 2021-11-23: qty 10

## 2021-11-23 MED ORDER — TRILACICLIB DIHYDROCHLORIDE INJECTION 300 MG
240.0000 mg/m2 | Freq: Once | INTRAVENOUS | Status: AC
  Administered 2021-11-23: 480 mg via INTRAVENOUS
  Filled 2021-11-23: qty 32

## 2021-11-23 MED ORDER — SODIUM CHLORIDE 0.9 % IV SOLN
100.0000 mg/m2 | Freq: Once | INTRAVENOUS | Status: AC
  Administered 2021-11-23: 200 mg via INTRAVENOUS
  Filled 2021-11-23: qty 10

## 2021-11-23 MED FILL — Dexamethasone Sodium Phosphate Inj 100 MG/10ML: INTRAMUSCULAR | Qty: 1 | Status: AC

## 2021-11-23 NOTE — Patient Instructions (Signed)
[facility]   Discharge Instructions: Thank you for choosing [facility] to provide your oncology and hematology care.   If you have a lab appointment with the [facility], please go directly to the [facility] and check in at the registration area.   Wear comfortable clothing and clothing appropriate for easy access to any Portacath or PICC line.   We strive to give you quality time with your provider. You may need to reschedule your appointment if you arrive late (15 or more minutes).  Arriving late affects you and other patients whose appointments are after yours.  Also, if you miss three or more appointments without notifying the office, you may be dismissed from the clinic at the providers discretion.      For prescription refill requests, have your pharmacy contact our office and allow 72 hours for refills to be completed.    Today you received the following chemotherapy and/or immunotherapy agents: cosela, etoposide.      To help prevent nausea and vomiting after your treatment, we encourage you to take your nausea medication as directed.  BELOW ARE SYMPTOMS THAT SHOULD BE REPORTED IMMEDIATELY: *FEVER GREATER THAN 100.4 F (38 C) OR HIGHER *CHILLS OR SWEATING *NAUSEA AND VOMITING THAT IS NOT CONTROLLED WITH YOUR NAUSEA MEDICATION *UNUSUAL SHORTNESS OF BREATH *UNUSUAL BRUISING OR BLEEDING *URINARY PROBLEMS (pain or burning when urinating, or frequent urination) *BOWEL PROBLEMS (unusual diarrhea, constipation, pain near the anus) TENDERNESS IN MOUTH AND THROAT WITH OR WITHOUT PRESENCE OF ULCERS (sore throat, sores in mouth, or a toothache) UNUSUAL RASH, SWELLING OR PAIN  UNUSUAL VAGINAL DISCHARGE OR ITCHING   Items with * indicate a potential emergency and should be followed up as soon as possible or go to the Emergency Department if any problems should occur.  Please show the CHEMOTHERAPY ALERT CARD or IMMUNOTHERAPY ALERT CARD at  check-in to the Emergency Department and triage nurse.  Should you have questions after your visit or need to cancel or reschedule your appointment, please contact [facility]  Dept: [phone]  and follow the prompts.  Office hours are 8:00 a.m. to 4:30 p.m. Monday - Friday. Please note that voicemails left after 4:00 p.m. may not be returned until the following business day.  We are closed weekends and major holidays. You have access to a nurse at all times for urgent questions. Please call the main number to the clinic Dept: [phone] and follow the prompts.   For any non-urgent questions, you may also contact your provider using MyChart. We now offer e-Visits for anyone 9 and older to request care online for non-urgent symptoms. For details visit [email or web address].   Also download the MyChart app! Go to the app store, search "MyChart", open the app, select [facility], and log in with your MyChart username and password.  Due to Covid, a mask is required upon entering the hospital/clinic. If you do not have a mask, one will be given to you upon arrival. For doctor visits, patients may have 1 support person aged 83 or older with them. For treatment visits, patients cannot have anyone with them due to current Covid guidelines and our immunocompromised population.

## 2021-11-24 ENCOUNTER — Telehealth: Payer: Self-pay | Admitting: Internal Medicine

## 2021-11-24 ENCOUNTER — Other Ambulatory Visit: Payer: Self-pay

## 2021-11-24 ENCOUNTER — Inpatient Hospital Stay: Payer: No Typology Code available for payment source

## 2021-11-24 VITALS — BP 149/89 | HR 63 | Temp 97.7°F | Resp 18

## 2021-11-24 DIAGNOSIS — C3481 Malignant neoplasm of overlapping sites of right bronchus and lung: Secondary | ICD-10-CM

## 2021-11-24 DIAGNOSIS — Z5112 Encounter for antineoplastic immunotherapy: Secondary | ICD-10-CM | POA: Diagnosis not present

## 2021-11-24 MED ORDER — SODIUM CHLORIDE 0.9 % IV SOLN
10.0000 mg | Freq: Once | INTRAVENOUS | Status: AC
  Administered 2021-11-24: 10 mg via INTRAVENOUS
  Filled 2021-11-24: qty 10

## 2021-11-24 MED ORDER — SODIUM CHLORIDE 0.9% FLUSH
10.0000 mL | INTRAVENOUS | Status: DC | PRN
  Administered 2021-11-24: 10 mL

## 2021-11-24 MED ORDER — HEPARIN SOD (PORK) LOCK FLUSH 100 UNIT/ML IV SOLN
500.0000 [IU] | Freq: Once | INTRAVENOUS | Status: AC | PRN
  Administered 2021-11-24: 500 [IU]

## 2021-11-24 MED ORDER — SODIUM CHLORIDE 0.9 % IV SOLN: Freq: Once | INTRAVENOUS | Status: AC

## 2021-11-24 MED ORDER — TRILACICLIB DIHYDROCHLORIDE INJECTION 300 MG
240.0000 mg/m2 | Freq: Once | INTRAVENOUS | Status: AC
  Administered 2021-11-24: 480 mg via INTRAVENOUS
  Filled 2021-11-24: qty 32

## 2021-11-24 MED ORDER — SODIUM CHLORIDE 0.9 % IV SOLN
100.0000 mg/m2 | Freq: Once | INTRAVENOUS | Status: AC
  Administered 2021-11-24: 200 mg via INTRAVENOUS
  Filled 2021-11-24: qty 10

## 2021-11-24 NOTE — Patient Instructions (Signed)
[facility]   Discharge Instructions: Thank you for choosing [facility] to provide your oncology and hematology care.   If you have a lab appointment with the [facility], please go directly to the [facility] and check in at the registration area.   Wear comfortable clothing and clothing appropriate for easy access to any Portacath or PICC line.   We strive to give you quality time with your provider. You may need to reschedule your appointment if you arrive late (15 or more minutes).  Arriving late affects you and other patients whose appointments are after yours.  Also, if you miss three or more appointments without notifying the office, you may be dismissed from the clinic at the providers discretion.      For prescription refill requests, have your pharmacy contact our office and allow 72 hours for refills to be completed.    Today you received the following chemotherapy and/or immunotherapy agents: cosela, etoposide.      To help prevent nausea and vomiting after your treatment, we encourage you to take your nausea medication as directed.  BELOW ARE SYMPTOMS THAT SHOULD BE REPORTED IMMEDIATELY: *FEVER GREATER THAN 100.4 F (38 C) OR HIGHER *CHILLS OR SWEATING *NAUSEA AND VOMITING THAT IS NOT CONTROLLED WITH YOUR NAUSEA MEDICATION *UNUSUAL SHORTNESS OF BREATH *UNUSUAL BRUISING OR BLEEDING *URINARY PROBLEMS (pain or burning when urinating, or frequent urination) *BOWEL PROBLEMS (unusual diarrhea, constipation, pain near the anus) TENDERNESS IN MOUTH AND THROAT WITH OR WITHOUT PRESENCE OF ULCERS (sore throat, sores in mouth, or a toothache) UNUSUAL RASH, SWELLING OR PAIN  UNUSUAL VAGINAL DISCHARGE OR ITCHING   Items with * indicate a potential emergency and should be followed up as soon as possible or go to the Emergency Department if any problems should occur.  Please show the CHEMOTHERAPY ALERT CARD or IMMUNOTHERAPY ALERT CARD at  check-in to the Emergency Department and triage nurse.  Should you have questions after your visit or need to cancel or reschedule your appointment, please contact [facility]  Dept: [phone]  and follow the prompts.  Office hours are 8:00 a.m. to 4:30 p.m. Monday - Friday. Please note that voicemails left after 4:00 p.m. may not be returned until the following business day.  We are closed weekends and major holidays. You have access to a nurse at all times for urgent questions. Please call the main number to the clinic Dept: [phone] and follow the prompts.   For any non-urgent questions, you may also contact your provider using MyChart. We now offer e-Visits for anyone 52 and older to request care online for non-urgent symptoms. For details visit [email or web address].   Also download the MyChart app! Go to the app store, search "MyChart", open the app, select , and log in with your MyChart username and password.  Due to Covid, a mask is required upon entering the hospital/clinic. If you do not have a mask, one will be given to you upon arrival. For doctor visits, patients may have 1 support person aged 64 or older with them. For treatment visits, patients cannot have anyone with them due to current Covid guidelines and our immunocompromised population.

## 2021-11-24 NOTE — Telephone Encounter (Signed)
Scheduled per 01/31 los, called and spoke with patient's spouse. Patient will be notified of all upcoming appointments.

## 2021-11-26 ENCOUNTER — Inpatient Hospital Stay: Payer: No Typology Code available for payment source

## 2021-11-26 ENCOUNTER — Other Ambulatory Visit: Payer: Self-pay

## 2021-11-26 VITALS — BP 114/87 | HR 71 | Temp 98.2°F | Resp 18

## 2021-11-26 DIAGNOSIS — Z5112 Encounter for antineoplastic immunotherapy: Secondary | ICD-10-CM | POA: Diagnosis not present

## 2021-11-26 DIAGNOSIS — C3481 Malignant neoplasm of overlapping sites of right bronchus and lung: Secondary | ICD-10-CM

## 2021-11-26 MED ORDER — PEGFILGRASTIM-CBQV 6 MG/0.6ML ~~LOC~~ SOSY
6.0000 mg | PREFILLED_SYRINGE | Freq: Once | SUBCUTANEOUS | Status: AC
  Administered 2021-11-26: 6 mg via SUBCUTANEOUS
  Filled 2021-11-26: qty 0.6

## 2021-11-26 NOTE — Patient Instructions (Signed)

## 2021-11-29 ENCOUNTER — Inpatient Hospital Stay: Payer: No Typology Code available for payment source

## 2021-12-06 ENCOUNTER — Other Ambulatory Visit: Payer: Self-pay

## 2021-12-06 ENCOUNTER — Inpatient Hospital Stay: Payer: No Typology Code available for payment source

## 2021-12-06 DIAGNOSIS — Z95828 Presence of other vascular implants and grafts: Secondary | ICD-10-CM

## 2021-12-06 DIAGNOSIS — Z5112 Encounter for antineoplastic immunotherapy: Secondary | ICD-10-CM | POA: Diagnosis not present

## 2021-12-06 DIAGNOSIS — C3481 Malignant neoplasm of overlapping sites of right bronchus and lung: Secondary | ICD-10-CM

## 2021-12-06 LAB — CMP (CANCER CENTER ONLY)
ALT: 21 U/L (ref 0–44)
AST: 19 U/L (ref 15–41)
Albumin: 4 g/dL (ref 3.5–5.0)
Alkaline Phosphatase: 68 U/L (ref 38–126)
Anion gap: 5 (ref 5–15)
BUN: 12 mg/dL (ref 8–23)
CO2: 27 mmol/L (ref 22–32)
Calcium: 9.2 mg/dL (ref 8.9–10.3)
Chloride: 103 mmol/L (ref 98–111)
Creatinine: 0.78 mg/dL (ref 0.61–1.24)
GFR, Estimated: >60 mL/min (ref >60)
Glucose, Bld: 123 mg/dL — ABNORMAL HIGH (ref 70–99)
Potassium: 3.9 mmol/L (ref 3.5–5.1)
Sodium: 135 mmol/L (ref 135–145)
Total Bilirubin: 0.3 mg/dL (ref 0.3–1.2)
Total Protein: 6.7 g/dL (ref 6.5–8.1)

## 2021-12-06 LAB — CBC WITH DIFFERENTIAL (CANCER CENTER ONLY)
Abs Immature Granulocytes: 0.26 10*3/uL — ABNORMAL HIGH (ref 0.00–0.07)
Basophils Absolute: 0.1 10*3/uL (ref 0.0–0.1)
Basophils Relative: 1 %
Eosinophils Absolute: 0.1 10*3/uL (ref 0.0–0.5)
Eosinophils Relative: 1 %
HCT: 34.7 % — ABNORMAL LOW (ref 39.0–52.0)
Hemoglobin: 12.8 g/dL — ABNORMAL LOW (ref 13.0–17.0)
Immature Granulocytes: 4 %
Lymphocytes Relative: 30 %
Lymphs Abs: 1.9 10*3/uL (ref 0.7–4.0)
MCH: 33.2 pg (ref 26.0–34.0)
MCHC: 36.9 g/dL — ABNORMAL HIGH (ref 30.0–36.0)
MCV: 90.1 fL (ref 80.0–100.0)
Monocytes Absolute: 0.6 10*3/uL (ref 0.1–1.0)
Monocytes Relative: 10 %
Neutro Abs: 3.3 10*3/uL (ref 1.7–7.7)
Neutrophils Relative %: 54 %
Platelet Count: 62 10*3/uL — ABNORMAL LOW (ref 150–400)
RBC: 3.85 MIL/uL — ABNORMAL LOW (ref 4.22–5.81)
RDW: 14.2 % (ref 11.5–15.5)
WBC Count: 6.2 10*3/uL (ref 4.0–10.5)
nRBC: 0 % (ref 0.0–0.2)

## 2021-12-06 LAB — TSH: TSH: <0.08 u[IU]/mL — ABNORMAL LOW (ref 0.320–4.118)

## 2021-12-06 MED ORDER — SODIUM CHLORIDE 0.9% FLUSH
10.0000 mL | Freq: Once | INTRAVENOUS | Status: AC
  Administered 2021-12-06: 10 mL

## 2021-12-06 MED ORDER — HEPARIN SOD (PORK) LOCK FLUSH 100 UNIT/ML IV SOLN
500.0000 [IU] | Freq: Once | INTRAVENOUS | Status: AC
  Administered 2021-12-06: 500 [IU]

## 2021-12-07 ENCOUNTER — Telehealth: Payer: Self-pay

## 2021-12-07 NOTE — Telephone Encounter (Signed)
Spoke with patient's spouse regarding TSH lab and thyroid follow-up with his PCP. The spouse confirmed that there is an appointment scheduled for this Friday 12/09/2021 for thyroid work-up. Cassie, PA notified of this.

## 2021-12-09 ENCOUNTER — Ambulatory Visit (INDEPENDENT_AMBULATORY_CARE_PROVIDER_SITE_OTHER): Payer: No Typology Code available for payment source | Admitting: Endocrinology

## 2021-12-09 ENCOUNTER — Other Ambulatory Visit: Payer: Self-pay

## 2021-12-09 ENCOUNTER — Encounter: Payer: Self-pay | Admitting: Endocrinology

## 2021-12-09 DIAGNOSIS — E0590 Thyrotoxicosis, unspecified without thyrotoxic crisis or storm: Secondary | ICD-10-CM

## 2021-12-09 MED ORDER — METHIMAZOLE 10 MG PO TABS: 20.0000 mg | ORAL_TABLET | Freq: Two times a day (BID) | ORAL | 3 refills | Status: DC

## 2021-12-09 NOTE — Progress Notes (Signed)
[place] [address]  DIAGNOSIS: Extensive stage (T1b, N3, M1 a) small cell lung cancer presented with right upper lobe nodule in addition to multiple left lung nodule as well as bilateral hilar and mediastinal lymphadenopathy diagnosed in November 2022.  PRIOR THERAPY: None  CURRENT THERAPY: Systemic chemotherapy with carboplatin for AUC of 5 on day 1 and etoposide 100 Mg/M2 on days 1, 2 and 3 with Cosela for Myeloprotection as well as Imfinzi 1500 Mg IV every 3 weeks.  Started September 20, 2021.  Status post 4 cycles.  Starting from cycle #5, the patient will start maintenance immunotherapy with Imfinzi.  INTERVAL HISTORY: [facility] 76 y.o. male returns to the clinic today for a follow-up visit accompanied by his wife.  The patient is feeling fairly well today without any concerning complaints.  He tolerated chemotherapy and immunotherapy exceptionally well without any concerning adverse side effects or significant lab abnormalities.  He is expected to start maintenance immunotherapy today.  The patient denies any recent fever, chills, night sweats, or unexplained weight loss.  He reports a good appetite.  He denies any chest pain, cough, or hemoptysis.  He reports his baseline dyspnea on exertion.  Denies any nausea, vomiting, diarrhea, or constipation.  Denies any headache or visual changes.  Denies any rashes or skin changes.  The patient had a baseline suppressed TSH prior to starting his treatment.  The patient saw his primary care provider on 12/09/2021. They performed imaging studies and are following up with them on Friday of this week to review the results.  The patient is here today for evaluation and repeat blood work before starting cycle #5 which will be his first cycle of maintenance single agent immunotherapy with Imfinzi.   MEDICAL HISTORY: Past Medical History:  Diagnosis  Date   Bipolar disorder ([facility])    Depression    GERD (gastroesophageal reflux disease)    Hyperlipidemia    Hypertension    Orthostatic hypotension    Sleep apnea    has a cpap and doesn't use it   Syncope and collapse    Tobacco use     ALLERGIES:  has No Known Allergies.  MEDICATIONS:  Current Outpatient Medications  Medication Sig Dispense Refill   ARIPiprazole (ABILIFY) 5 MG tablet Take 5 mg by mouth daily. For mood stabilization and depression     calcium-vitamin D (OSCAL WITH D) 500-200 MG-UNIT tablet Take 1 tablet by mouth daily.     cyanocobalamin 500 MCG tablet Take 500 mcg by mouth daily. Vitamin B12     divalproex (DEPAKOTE) 500 MG DR tablet Take by mouth.     folic acid (FOLVITE) 1 MG tablet Take 1 tablet by mouth daily.     hydrochlorothiazide (HYDRODIURIL) 25 MG tablet Take 12.5 mg by mouth daily.     lidocaine-prilocaine (EMLA) cream Apply to the Port-A-Cath site 30-60-minute before chemotherapy 30 g 0   lisinopril (PRINIVIL,ZESTRIL) 40 MG tablet Take 1 tablet (40 mg total) by mouth daily. For hypertension. (Patient taking differently: Take 40 mg by mouth daily. For blood pressure)     MELATONIN PO Take 1 tablet by mouth at bedtime.     methimazole (TAPAZOLE) 10 MG tablet Take 2 tablets (20 mg total) by mouth 2 (two) times daily. 360 tablet 3   prochlorperazine (COMPAZINE) 10 MG tablet Take 1 tablet (10 mg total) by mouth every 6 (six) hours as needed for nausea  or vomiting. 30 tablet 0   propranolol (INDERAL) 10 MG tablet Take 10 mg by mouth 2 (two) times daily.     rosuvastatin (CRESTOR) 40 MG tablet Take 40 mg by mouth daily.     sildenafil (VIAGRA) 100 MG tablet TAKE ONE-HALF TABLET BY MOUTH AS INSTRUCTED (TAKE ONE HOUR PRIOR TO SEXUAL ACTIVITY) *DO NOT EXCEED ONE DOSE IN A 24 HOUR PERIOD*     Tiotropium Bromide-Olodaterol 2.5-2.5 MCG/ACT AERS INHALE 2 PUFFS BY MOUTH DAILY     traZODone (DESYREL) 150 MG tablet Take 150 mg by mouth at bedtime as needed for sleep.      venlafaxine XR (EFFEXOR-XR) 75 MG 24 hr capsule Take 75 mg by mouth daily with breakfast.     No current facility-administered medications for this visit.    SURGICAL HISTORY:  Past Surgical History:  Procedure Laterality Date   BRONCHIAL BIOPSY  08/30/2021   Procedure: BRONCHIAL BIOPSIES;  Surgeon: Garner Nash, DO;  Location: [facility];  Service: Pulmonary;;   BRONCHIAL BRUSHINGS  08/30/2021   Procedure: BRONCHIAL BRUSHINGS;  Surgeon: Garner Nash, DO;  Location: [facility];  Service: Pulmonary;;   BRONCHIAL NEEDLE ASPIRATION BIOPSY  08/30/2021   Procedure: BRONCHIAL NEEDLE ASPIRATION BIOPSIES;  Surgeon: Garner Nash, DO;  Location: [facility];  Service: Pulmonary;;   BRONCHIAL WASHINGS  08/30/2021   Procedure: BRONCHIAL WASHINGS;  Surgeon: Garner Nash, DO;  Location: [facility];  Service: Pulmonary;;   COLONOSCOPY     FINE NEEDLE ASPIRATION  08/30/2021   Procedure: FINE NEEDLE ASPIRATION (FNA) LINEAR;  Surgeon: Garner Nash, DO;  Location: [facility];  Service: Pulmonary;;   HERNIA REPAIR     inquinal   IR IMAGING GUIDED PORT INSERTION  09/26/2021   VIDEO BRONCHOSCOPY WITH ENDOBRONCHIAL NAVIGATION Left 08/30/2021   Procedure: VIDEO BRONCHOSCOPY WITH ENDOBRONCHIAL NAVIGATION;  Surgeon: Garner Nash, DO;  Location: [facility];  Service: Pulmonary;  Laterality: Left;  ION w/ CIOS   VIDEO BRONCHOSCOPY WITH ENDOBRONCHIAL ULTRASOUND Left 08/30/2021   Procedure: VIDEO BRONCHOSCOPY WITH ENDOBRONCHIAL ULTRASOUND;  Surgeon: Garner Nash, DO;  Location: [facility];  Service: Pulmonary;  Laterality: Left;   VIDEO BRONCHOSCOPY WITH RADIAL ENDOBRONCHIAL ULTRASOUND  08/30/2021   Procedure: RADIAL ENDOBRONCHIAL ULTRASOUND;  Surgeon: Garner Nash, DO;  Location: [facility];  Service: Pulmonary;;    REVIEW OF SYSTEMS:   Review of Systems  Constitutional: Negative for appetite change, chills, fatigue, fever and unexpected weight change.  HENT: Positive for  hard of hearing. Negative for mouth sores, nosebleeds, sore throat and trouble swallowing.   Eyes: Negative for eye problems and icterus.  Respiratory: Positive for baseline dyspnea on exertion. Negative for cough, hemoptysis, and wheezing.   Cardiovascular: Negative for chest pain and leg swelling.  Gastrointestinal: Negative for abdominal pain, constipation, diarrhea, nausea and vomiting.  Genitourinary: Negative for bladder incontinence, difficulty urinating, dysuria, frequency and hematuria.   Musculoskeletal: Negative for back pain, gait problem, neck pain and neck stiffness.  Skin: Negative for itching and rash.  Neurological: Negative for dizziness, extremity weakness, gait problem, headaches, light-headedness and seizures.  Hematological: Negative for adenopathy. Does not bruise/bleed easily except for one episode after stepping on a shard of glass.  Psychiatric/Behavioral: Negative for confusion, depression and sleep disturbance. The patient is not nervous/anxious.     PHYSICAL EXAMINATION:  Blood pressure (!) 126/103, pulse 83, temperature (!) 97.4 F (36.3 C), temperature source Tympanic, resp. rate 16, weight 203 lb (92.1 kg), SpO2 100 %.  ECOG  PERFORMANCE STATUS: 1  Physical Exam  Constitutional: Oriented to person, place, and time and well-developed, well-nourished, and in no distress.  HENT:  Head: Positive for hard of hearing. Normocephalic and atraumatic.  Mouth/Throat: Oropharynx is clear and moist. No oropharyngeal exudate.  Eyes: Conjunctivae are normal. Right eye exhibits no discharge. Left eye exhibits no discharge. No scleral icterus.  Neck: Normal range of motion. Neck supple.  Cardiovascular: Normal rate, regular rhythm, normal heart sounds and intact distal pulses.   Pulmonary/Chest: Effort normal and breath sounds normal. No respiratory distress. No wheezes. No rales.  Abdominal: Soft. Bowel sounds are normal. Exhibits no distension and no mass. There is no  tenderness.  Musculoskeletal: Normal range of motion. Exhibits no edema.  Lymphadenopathy:    No cervical adenopathy.  Neurological: Alert and oriented to person, place, and time. Exhibits normal muscle tone. Gait normal. Coordination normal.  Skin: Skin is warm and dry. No rash noted. Not diaphoretic. No erythema. No pallor.  Psychiatric: Mood, memory and judgment normal.  Vitals reviewed.  LABORATORY DATA: Lab Results  Component Value Date   WBC 7.6 12/13/2021   HGB 13.6 12/13/2021   HCT 37.5 (L) 12/13/2021   MCV 91.9 12/13/2021   PLT 163 12/13/2021      Chemistry      Component Value Date/Time   NA 136 12/13/2021 1042   K 3.9 12/13/2021 1042   CL 102 12/13/2021 1042   CO2 30 12/13/2021 1042   BUN 13 12/13/2021 1042   CREATININE 0.81 12/13/2021 1042      Component Value Date/Time   CALCIUM 9.5 12/13/2021 1042   ALKPHOS 66 12/13/2021 1042   AST 19 12/13/2021 1042   ALT 23 12/13/2021 1042   BILITOT 0.6 12/13/2021 1042       RADIOGRAPHIC STUDIES:  No results found.   ASSESSMENT/PLAN:  This is a very pleasant 76 year old Caucasian male diagnosed with extensive stage (T1b, N3, M1 a) small cell lung cancer.  He presented with a right upper lobe lung nodule in addition to multiple left lung nodules and bilateral mediastinal lymphadenopathy.  He was diagnosed in November 2022.  The patient is currently undergoing palliative systemic chemotherapy with carboplatin for an AUC of 5 on day 1, etoposide 100 mg per metered squared on days 1, 2, and 3 and Imfinzi on day 115 mg IV every 3 weeks.  He receives [employer] for myeloprotection.  He is status post 4 cycles.  Starting from cycle #5, the patient will be on single agent immunotherapy with Imfinzi IV every 4 weeks. He tolerated treatment very well without any concerning adverse side effects of significant lab abnormalities.     Labs were reviewed.  Recommend that he proceed with cycle #5 today scheduled.   I will arrange for  restaging CT scan the chest, abdomen, and pelvis prior to starting his neck cycle of treatment.  He will continue to follow with his PCP for his thyroid dysfunction.   The patient was advised to call immediately if she has any concerning symptoms in the interval. The patient voices understanding of current disease status and treatment options and is in agreement with the current care plan. All questions were answered. The patient knows to call the clinic with any problems, questions or concerns. We can certainly see the patient much sooner if necessary        Orders Placed This Encounter  Procedures   CT Abdomen Pelvis W Contrast    Standing Status:   Future  Standing Expiration Date:   12/13/2022    Order Specific Question:   If indicated for the ordered procedure, I authorize the administration of contrast media per Radiology protocol    Answer:   Yes    Order Specific Question:   Preferred imaging location?    Answer:   [facility]    Order Specific Question:   Is Oral Contrast requested for this exam?    Answer:   Yes, Per Radiology protocol   CT Chest W Contrast    Standing Status:   Future    Standing Expiration Date:   12/13/2022    Order Specific Question:   If indicated for the ordered procedure, I authorize the administration of contrast media per Radiology protocol    Answer:   Yes    Order Specific Question:   Preferred imaging location?    Answer:   [facility]      The total time spent in the appointment was 20-29 minutes.   Theador Jezewski L Joslyne Marshburn, PA-C 12/13/21

## 2021-12-09 NOTE — Progress Notes (Signed)
Subjective:    Patient ID: Austin Wells, male    DOB: July 19, 1946, 76 y.o.   MRN: [id number]  HPI Pt is referred by Dr Leo Grosser, for hyperthyroidism.  Pt reports he was dx'ed with hyperthyroidism in late 2022.  He was then rx'ed tapazole, but he sometimes misses.   He has never had XRT to the anterior neck, or thyroid surgery.  He has never had thyroid imaging.  He does not consume non-prescribed thyroid medication.  He has never been on amiodarone.  He has 20 lb weight gain x the past month.  No change in chronic tremor.  Leukopenia resolved without change in rx.   Past Medical History:  Diagnosis Date   Bipolar disorder ([place])    Depression    GERD (gastroesophageal reflux disease)    Hyperlipidemia    Hypertension    Orthostatic hypotension    Sleep apnea    has a cpap and doesn't use it   Syncope and collapse    Tobacco use     Past Surgical History:  Procedure Laterality Date   BRONCHIAL BIOPSY  08/30/2021   Procedure: BRONCHIAL BIOPSIES;  Surgeon: Garner Nash, DO;  Location: [place];  Service: Pulmonary;;   BRONCHIAL BRUSHINGS  08/30/2021   Procedure: BRONCHIAL BRUSHINGS;  Surgeon: Garner Nash, DO;  Location: [facility];  Service: Pulmonary;;   BRONCHIAL NEEDLE ASPIRATION BIOPSY  08/30/2021   Procedure: BRONCHIAL NEEDLE ASPIRATION BIOPSIES;  Surgeon: Garner Nash, DO;  Location: [place] [facility];  Service: Pulmonary;;   BRONCHIAL WASHINGS  08/30/2021   Procedure: BRONCHIAL WASHINGS;  Surgeon: Garner Nash, DO;  Location: [place];  Service: Pulmonary;;   COLONOSCOPY     FINE NEEDLE ASPIRATION  08/30/2021   Procedure: FINE NEEDLE ASPIRATION (FNA) LINEAR;  Surgeon: Garner Nash, DO;  Location: [facility];  Service: Pulmonary;;   HERNIA REPAIR     inquinal   IR IMAGING GUIDED PORT INSERTION  09/26/2021   VIDEO BRONCHOSCOPY WITH ENDOBRONCHIAL NAVIGATION Left 08/30/2021   Procedure: VIDEO BRONCHOSCOPY WITH ENDOBRONCHIAL NAVIGATION;  Surgeon: Garner Nash, DO;  Location: [facility];  Service: Pulmonary;  Laterality: Left;  ION w/ CIOS   VIDEO BRONCHOSCOPY WITH ENDOBRONCHIAL ULTRASOUND Left 08/30/2021   Procedure: VIDEO BRONCHOSCOPY WITH ENDOBRONCHIAL ULTRASOUND;  Surgeon: Garner Nash, DO;  Location: [place];  Service: Pulmonary;  Laterality: Left;   VIDEO BRONCHOSCOPY WITH RADIAL ENDOBRONCHIAL ULTRASOUND  08/30/2021   Procedure: RADIAL ENDOBRONCHIAL ULTRASOUND;  Surgeon: Garner Nash, DO;  Location: [facility];  Service: Pulmonary;;    Social History   Socioeconomic History   Marital status: Married    Spouse name: Not on file   Number of children: Not on file   Years of education: Not on file   Highest education level: Not on file  Occupational History   Not on file  Tobacco Use   Smoking status: Every Day    Packs/day: 1.00    Years: 55.00    Pack years: 55.00    Types: Cigarettes    Start date: 1967   Smokeless tobacco: Never   Tobacco comments:    Currently smoking 1.5ppd as of 10/28/22ep  Vaping Use   Vaping Use: Never used  Substance and Sexual Activity   Alcohol use: Yes    Comment: very rare   Drug use: Yes    Types: "Crack" cocaine    Comment: Patient stated he last used "last week" (week of Aug 23, 2021)   Sexual activity:  Not on file  Other Topics Concern   Not on file  Social History Narrative   Not on file   Social Determinants of Health   Financial Resource Strain: Not on file  Food Insecurity: Not on file  Transportation Needs: Not on file  Physical Activity: Not on file  Stress: Not on file  Social Connections: Not on file  Intimate Partner Violence: Not on file    Current Outpatient Medications on File Prior to Visit  Medication Sig Dispense Refill   ARIPiprazole (ABILIFY) 5 MG tablet Take 5 mg by mouth daily. For mood stabilization and depression     calcium-vitamin D (OSCAL WITH D) 500-200 MG-UNIT tablet Take 1 tablet by mouth daily.     cyanocobalamin 500 MCG tablet Take 500  mcg by mouth daily. Vitamin B12     divalproex (DEPAKOTE) 500 MG DR tablet Take by mouth.     folic acid (FOLVITE) 1 MG tablet Take 1 tablet by mouth daily.     hydrochlorothiazide (HYDRODIURIL) 25 MG tablet Take 12.5 mg by mouth daily.     lidocaine-prilocaine (EMLA) cream Apply to the Port-A-Cath site 30-60-minute before chemotherapy 30 g 0   lisinopril (PRINIVIL,ZESTRIL) 40 MG tablet Take 1 tablet (40 mg total) by mouth daily. For hypertension. (Patient taking differently: Take 40 mg by mouth daily. For blood pressure)     MELATONIN PO Take 1 tablet by mouth at bedtime.     prochlorperazine (COMPAZINE) 10 MG tablet Take 1 tablet (10 mg total) by mouth every 6 (six) hours as needed for nausea or vomiting. 30 tablet 0   propranolol (INDERAL) 10 MG tablet Take 10 mg by mouth 2 (two) times daily.     rosuvastatin (CRESTOR) 40 MG tablet Take 40 mg by mouth daily.     sildenafil (VIAGRA) 100 MG tablet TAKE ONE-HALF TABLET BY MOUTH AS INSTRUCTED (TAKE ONE HOUR PRIOR TO SEXUAL ACTIVITY) *DO NOT EXCEED ONE DOSE IN A 24 HOUR PERIOD*     Tiotropium Bromide-Olodaterol 2.5-2.5 MCG/ACT AERS INHALE 2 PUFFS BY MOUTH DAILY     traZODone (DESYREL) 150 MG tablet Take 150 mg by mouth at bedtime as needed for sleep.     venlafaxine XR (EFFEXOR-XR) 75 MG 24 hr capsule Take 75 mg by mouth daily with breakfast.     No current facility-administered medications on file prior to visit.    No Known Allergies  Family History  Problem Relation Age of Onset   Thyroid disease Neg Hx     BP 140/90    Pulse 88    Ht 5\' 11"  (1.803 m)    Wt 204 lb 9.6 oz (92.8 kg)    SpO2 97%    BMI 28.54 kg/m    Review of Systems denies palpitations, sob, excessive diaphoresis, anxiety, and heat intolerance.      Objective:   Physical Exam VS: see vs page GEN: no distress HEAD: head: no deformity eyes: no periorbital swelling, no proptosis external nose and ears are normal NECK: supple, thyroid is not enlarged CHEST WALL: no  deformity LUNGS: clear to auscultation CV: reg rate and rhythm, no murmur.  MUSCULOSKELETAL: gait is normal and steady EXTEMITIES: no deformity.  no leg edema NEURO:  readily moves all 4's.  sensation is intact to touch on all 4's.  Slight fine tremor of the hands.  Hearing loss is noted SKIN:  Normal texture and temperature.  No rash or suspicious lesion is visible.  slight diaphoresis of the hands NODES:  None palpable at the neck.   PSYCH: alert, well-oriented.  Does not appear anxious nor depressed.    I have reviewed outside records, and summarized: Pt was noted to have abnormal TFT, and referred here.  He was rx'ed tapazole, but low TSH persisted.  He is receiving chemotx for lung CA   Lab Results  Component Value Date   TSH <0.080 (L) 12/06/2021     Assessment & Plan:  Hyperthyroidism: uncontrolled.    Patient Instructions  I have sent a prescription to your pharmacy, to increase the methimazole. If ever you have fever while taking methimazole, stop it and call us, even if the reason is obvious, because of the risk of a rare side-effect. It is best to never miss the medication.  However, if you do miss it, next best is to double up the next time. Please continue the same propranolol.  Please come back for a follow-up appointment in 2-3 weeks.

## 2021-12-09 NOTE — Patient Instructions (Addendum)
I have sent a prescription to your pharmacy, to increase the methimazole. If ever you have fever while taking methimazole, stop it and call us, even if the reason is obvious, because of the risk of a rare side-effect. It is best to never miss the medication.  However, if you do miss it, next best is to double up the next time. Please continue the same propranolol.  Please come back for a follow-up appointment in 2-3 weeks.

## 2021-12-10 DIAGNOSIS — E0590 Thyrotoxicosis, unspecified without thyrotoxic crisis or storm: Secondary | ICD-10-CM | POA: Insufficient documentation

## 2021-12-13 ENCOUNTER — Inpatient Hospital Stay: Payer: No Typology Code available for payment source

## 2021-12-13 ENCOUNTER — Encounter: Payer: Self-pay | Admitting: Physician Assistant

## 2021-12-13 ENCOUNTER — Other Ambulatory Visit: Payer: Self-pay

## 2021-12-13 ENCOUNTER — Inpatient Hospital Stay (HOSPITAL_BASED_OUTPATIENT_CLINIC_OR_DEPARTMENT_OTHER): Payer: No Typology Code available for payment source | Admitting: Physician Assistant

## 2021-12-13 VITALS — BP 126/103 | HR 83 | Temp 97.4°F | Resp 16 | Wt 203.0 lb

## 2021-12-13 DIAGNOSIS — C3481 Malignant neoplasm of overlapping sites of right bronchus and lung: Secondary | ICD-10-CM

## 2021-12-13 DIAGNOSIS — Z5112 Encounter for antineoplastic immunotherapy: Secondary | ICD-10-CM | POA: Diagnosis not present

## 2021-12-13 DIAGNOSIS — Z95828 Presence of other vascular implants and grafts: Secondary | ICD-10-CM

## 2021-12-13 LAB — CBC WITH DIFFERENTIAL (CANCER CENTER ONLY)
Abs Immature Granulocytes: 0.08 10*3/uL — ABNORMAL HIGH (ref 0.00–0.07)
Basophils Absolute: 0 10*3/uL (ref 0.0–0.1)
Basophils Relative: 1 %
Eosinophils Absolute: 0.1 10*3/uL (ref 0.0–0.5)
Eosinophils Relative: 2 %
HCT: 37.5 % — ABNORMAL LOW (ref 39.0–52.0)
Hemoglobin: 13.6 g/dL (ref 13.0–17.0)
Immature Granulocytes: 1 %
Lymphocytes Relative: 20 %
Lymphs Abs: 1.5 10*3/uL (ref 0.7–4.0)
MCH: 33.3 pg (ref 26.0–34.0)
MCHC: 36.3 g/dL — ABNORMAL HIGH (ref 30.0–36.0)
MCV: 91.9 fL (ref 80.0–100.0)
Monocytes Absolute: 0.9 10*3/uL (ref 0.1–1.0)
Monocytes Relative: 12 %
Neutro Abs: 4.9 10*3/uL (ref 1.7–7.7)
Neutrophils Relative %: 64 %
Platelet Count: 163 10*3/uL (ref 150–400)
RBC: 4.08 MIL/uL — ABNORMAL LOW (ref 4.22–5.81)
RDW: 14.4 % (ref 11.5–15.5)
WBC Count: 7.6 10*3/uL (ref 4.0–10.5)
nRBC: 0 % (ref 0.0–0.2)

## 2021-12-13 LAB — CMP (CANCER CENTER ONLY)
ALT: 23 U/L (ref 0–44)
AST: 19 U/L (ref 15–41)
Albumin: 4.2 g/dL (ref 3.5–5.0)
Alkaline Phosphatase: 66 U/L (ref 38–126)
Anion gap: 4 — ABNORMAL LOW (ref 5–15)
BUN: 13 mg/dL (ref 8–23)
CO2: 30 mmol/L (ref 22–32)
Calcium: 9.5 mg/dL (ref 8.9–10.3)
Chloride: 102 mmol/L (ref 98–111)
Creatinine: 0.81 mg/dL (ref 0.61–1.24)
GFR, Estimated: >60 mL/min (ref >60)
Glucose, Bld: 131 mg/dL — ABNORMAL HIGH (ref 70–99)
Potassium: 3.9 mmol/L (ref 3.5–5.1)
Sodium: 136 mmol/L (ref 135–145)
Total Bilirubin: 0.6 mg/dL (ref 0.3–1.2)
Total Protein: 7 g/dL (ref 6.5–8.1)

## 2021-12-13 MED ORDER — SODIUM CHLORIDE 0.9 % IV SOLN
1500.0000 mg | Freq: Once | INTRAVENOUS | Status: AC
  Administered 2021-12-13: 1500 mg via INTRAVENOUS
  Filled 2021-12-13: qty 30

## 2021-12-13 MED ORDER — SODIUM CHLORIDE 0.9% FLUSH
10.0000 mL | INTRAVENOUS | Status: DC | PRN
  Administered 2021-12-13: 10 mL

## 2021-12-13 MED ORDER — SODIUM CHLORIDE 0.9 % IV SOLN: Freq: Once | INTRAVENOUS | Status: AC

## 2021-12-13 MED ORDER — SODIUM CHLORIDE 0.9% FLUSH
10.0000 mL | Freq: Once | INTRAVENOUS | Status: AC
  Administered 2021-12-13: 10 mL

## 2021-12-13 MED ORDER — HEPARIN SOD (PORK) LOCK FLUSH 100 UNIT/ML IV SOLN
500.0000 [IU] | Freq: Once | INTRAVENOUS | Status: AC | PRN
  Administered 2021-12-13: 500 [IU]

## 2021-12-13 NOTE — Patient Instructions (Signed)
[facility]  Discharge Instructions: Thank you for choosing [facility] to provide your oncology and hematology care.   If you have a lab appointment with the Cancer Center, please go directly to the Cancer Center and check in at the registration area.   Wear comfortable clothing and clothing appropriate for easy access to any Portacath or PICC line.   We strive to give you quality time with your provider. You may need to reschedule your appointment if you arrive late (15 or more minutes).  Arriving late affects you and other patients whose appointments are after yours.  Also, if you miss three or more appointments without notifying the office, you may be dismissed from the clinic at the provider's discretion.      For prescription refill requests, have your pharmacy contact our office and allow 72 hours for refills to be completed.    Today you received the following chemotherapy and/or immunotherapy agents imfinzi       To help prevent nausea and vomiting after your treatment, we encourage you to take your nausea medication as directed.  BELOW ARE SYMPTOMS THAT SHOULD BE REPORTED IMMEDIATELY: *FEVER GREATER THAN 100.4 F (38 C) OR HIGHER *CHILLS OR SWEATING *NAUSEA AND VOMITING THAT IS NOT CONTROLLED WITH YOUR NAUSEA MEDICATION *UNUSUAL SHORTNESS OF BREATH *UNUSUAL BRUISING OR BLEEDING *URINARY PROBLEMS (pain or burning when urinating, or frequent urination) *BOWEL PROBLEMS (unusual diarrhea, constipation, pain near the anus) TENDERNESS IN MOUTH AND THROAT WITH OR WITHOUT PRESENCE OF ULCERS (sore throat, sores in mouth, or a toothache) UNUSUAL RASH, SWELLING OR PAIN  UNUSUAL VAGINAL DISCHARGE OR ITCHING   Items with * indicate a potential emergency and should be followed up as soon as possible or go to the Emergency Department if any problems should occur.  Please show the CHEMOTHERAPY ALERT CARD or IMMUNOTHERAPY ALERT CARD at check-in to  the Emergency Department and triage nurse.  Should you have questions after your visit or need to cancel or reschedule your appointment, please contact [facility]  Dept: [phone]  and follow the prompts.  Office hours are 8:00 a.m. to 4:30 p.m. Monday - Friday. Please note that voicemails left after 4:00 p.m. may not be returned until the following business day.  We are closed weekends and major holidays. You have access to a nurse at all times for urgent questions. Please call the main number to the clinic Dept: [phone] and follow the prompts.   For any non-urgent questions, you may also contact your provider using MyChart. We now offer e-Visits for anyone 18 and older to request care online for non-urgent symptoms. For details visit [email or web address].   Also download the MyChart app! Go to the app store, search "MyChart", open the app, select [facility], and log in with your MyChart username and password.  Due to Covid, a mask is required upon entering the hospital/clinic. If you do not have a mask, one will be given to you upon arrival. For doctor visits, patients may have 1 support person aged 18 or older with them. For treatment visits, patients cannot have anyone with them due to current Covid guidelines and our immunocompromised population.   

## 2021-12-30 ENCOUNTER — Other Ambulatory Visit: Payer: Self-pay

## 2021-12-30 ENCOUNTER — Ambulatory Visit (INDEPENDENT_AMBULATORY_CARE_PROVIDER_SITE_OTHER): Payer: No Typology Code available for payment source | Admitting: Endocrinology

## 2021-12-30 VITALS — BP 140/100 | HR 85 | Ht 71.0 in | Wt 219.2 lb

## 2021-12-30 DIAGNOSIS — E0590 Thyrotoxicosis, unspecified without thyrotoxic crisis or storm: Secondary | ICD-10-CM | POA: Diagnosis not present

## 2021-12-30 MED ORDER — METHIMAZOLE 10 MG PO TABS: 20.0000 mg | ORAL_TABLET | Freq: Two times a day (BID) | ORAL | 3 refills | Status: DC

## 2021-12-30 NOTE — Patient Instructions (Addendum)
Your blood pressure is high today.  Please see your primary care provider soon, to have it rechecked.   ?I have resent the prescription to your pharmacy, for the methimazole.  ?If ever you have fever while taking methimazole, stop it and call us, even if the reason is obvious, because of the risk of a rare side-effect. ?It is best to never miss the medication.  However, if you do miss it, next best is to double up the next time.   ?Please continue the same propranolol.   ?Please come back for a follow-up appointment in 3 weeks.   ?

## 2021-12-30 NOTE — Progress Notes (Signed)
? ?Subjective:  ? ? Patient ID: Austin Wells, male    DOB: January 08, 1946, 76 y.o.   MRN: [id number] ? ?HPI ?Pt returns for f/u of hyperthyroidism (dx'ed 2022; he was rx'ed tapazole, but he sometimes misses; he has never had thyroid imaging; leukopenia resolved without change in rx).  Pt says pharmacy did not receive 12/09/21 tapazole rx.  Tremor is now intermittent.   ?Past Medical History:  ?Diagnosis Date  ? Bipolar disorder ([place])   ? Depression   ? GERD (gastroesophageal reflux disease)   ? Hyperlipidemia   ? Hypertension   ? Orthostatic hypotension   ? Sleep apnea   ? has a cpap and doesn't use it  ? Syncope and collapse   ? Tobacco use   ? ? ?Past Surgical History:  ?Procedure Laterality Date  ? BRONCHIAL BIOPSY  08/30/2021  ? Procedure: BRONCHIAL BIOPSIES;  Surgeon: Garner Nash, DO;  Location: [facility];  Service: Pulmonary;;  ? BRONCHIAL BRUSHINGS  08/30/2021  ? Procedure: BRONCHIAL BRUSHINGS;  Surgeon: Garner Nash, DO;  Location: [place] [facility];  Service: Pulmonary;;  ? BRONCHIAL NEEDLE ASPIRATION BIOPSY  08/30/2021  ? Procedure: BRONCHIAL NEEDLE ASPIRATION BIOPSIES;  Surgeon: Garner Nash, DO;  Location: [place] [facility];  Service: Pulmonary;;  ? BRONCHIAL WASHINGS  08/30/2021  ? Procedure: BRONCHIAL WASHINGS;  Surgeon: Garner Nash, DO;  Location: [facility];  Service: Pulmonary;;  ? COLONOSCOPY    ? FINE NEEDLE ASPIRATION  08/30/2021  ? Procedure: FINE NEEDLE ASPIRATION (FNA) LINEAR;  Surgeon: Garner Nash, DO;  Location: [facility];  Service: Pulmonary;;  ? HERNIA REPAIR    ? inquinal  ? IR IMAGING GUIDED PORT INSERTION  09/26/2021  ? VIDEO BRONCHOSCOPY WITH ENDOBRONCHIAL NAVIGATION Left 08/30/2021  ? Procedure: VIDEO BRONCHOSCOPY WITH ENDOBRONCHIAL NAVIGATION;  Surgeon: Garner Nash, DO;  Location: [facility];  Service: Pulmonary;  Laterality: Left;  ION w/ CIOS  ? VIDEO BRONCHOSCOPY WITH ENDOBRONCHIAL ULTRASOUND Left 08/30/2021  ? Procedure: VIDEO BRONCHOSCOPY WITH ENDOBRONCHIAL  ULTRASOUND;  Surgeon: Garner Nash, DO;  Location: [facility];  Service: Pulmonary;  Laterality: Left;  ? VIDEO BRONCHOSCOPY WITH RADIAL ENDOBRONCHIAL ULTRASOUND  08/30/2021  ? Procedure: RADIAL ENDOBRONCHIAL ULTRASOUND;  Surgeon: Garner Nash, DO;  Location: [facility];  Service: Pulmonary;;  ? ? ?Social History  ? ?Socioeconomic History  ? Marital status: Married  ?  Spouse name: Not on file  ? Number of children: Not on file  ? Years of education: Not on file  ? Highest education level: Not on file  ?Occupational History  ? Not on file  ?Tobacco Use  ? Smoking status: Every Day  ?  Packs/day: 1.00  ?  Years: 55.00  ?  Pack years: 55.00  ?  Types: Cigarettes  ?  Start date: 79  ? Smokeless tobacco: Never  ? Tobacco comments:  ?  Currently smoking 1.5ppd as of 10/28/22ep  ?Vaping Use  ? Vaping Use: Never used  ?Substance and Sexual Activity  ? Alcohol use: Yes  ?  Comment: very rare  ? Drug use: Yes  ?  Types: "Crack" cocaine  ?  Comment: Patient stated he last used "last week" (week of Aug 23, 2021)  ? Sexual activity: Not on file  ?Other Topics Concern  ? Not on file  ?Social History Narrative  ? Not on file  ? ?Social Determinants of Health  ? ?Financial Resource Strain: Not on file  ?Food Insecurity: Not on file  ?Transportation Needs: Not on  file  ?Physical Activity: Not on file  ?Stress: Not on file  ?Social Connections: Not on file  ?Intimate Partner Violence: Not on file  ? ? ?Current Outpatient Medications on File Prior to Visit  ?Medication Sig Dispense Refill  ? ARIPiprazole (ABILIFY) 5 MG tablet Take 5 mg by mouth daily. For mood stabilization and depression    ? calcium-vitamin D (OSCAL WITH D) 500-200 MG-UNIT tablet Take 1 tablet by mouth daily.    ? cyanocobalamin 500 MCG tablet Take 500 mcg by mouth daily. Vitamin B12    ? divalproex (DEPAKOTE) 500 MG DR tablet Take by mouth.    ? folic acid (FOLVITE) 1 MG tablet Take 1 tablet by mouth daily.    ? hydrochlorothiazide (HYDRODIURIL) 25 MG  tablet Take 12.5 mg by mouth daily.    ? lidocaine-prilocaine (EMLA) cream Apply to the Port-A-Cath site 30-60-minute before chemotherapy 30 g 0  ? lisinopril (PRINIVIL,ZESTRIL) 40 MG tablet Take 1 tablet (40 mg total) by mouth daily. For hypertension. (Patient taking differently: Take 40 mg by mouth daily. For blood pressure)    ? MELATONIN PO Take 1 tablet by mouth at bedtime.    ? prochlorperazine (COMPAZINE) 10 MG tablet Take 1 tablet (10 mg total) by mouth every 6 (six) hours as needed for nausea or vomiting. 30 tablet 0  ? propranolol (INDERAL) 10 MG tablet Take 10 mg by mouth 2 (two) times daily.    ? rosuvastatin (CRESTOR) 40 MG tablet Take 40 mg by mouth daily.    ? sildenafil (VIAGRA) 100 MG tablet TAKE ONE-HALF TABLET BY MOUTH AS INSTRUCTED (TAKE ONE HOUR PRIOR TO SEXUAL ACTIVITY) *DO NOT EXCEED ONE DOSE IN A 24 HOUR PERIOD*    ? Tiotropium Bromide-Olodaterol 2.5-2.5 MCG/ACT AERS INHALE 2 PUFFS BY MOUTH DAILY    ? traZODone (DESYREL) 150 MG tablet Take 150 mg by mouth at bedtime as needed for sleep.    ? venlafaxine XR (EFFEXOR-XR) 75 MG 24 hr capsule Take 75 mg by mouth daily with breakfast.    ? ?No current facility-administered medications on file prior to visit.  ? ? ?No Known Allergies ? ?Family History  ?Problem Relation Age of Onset  ? Thyroid disease Neg Hx   ? ? ?BP (!) 140/100   Pulse 85   Ht 5\' 11"  (1.803 m)   Wt 219 lb 3.2 oz (99.4 kg)   SpO2 97%   BMI 30.57 kg/m?  ? ? ?Review of Systems ?Denies fever ?   ?Objective:  ? Physical Exam ?VITAL SIGNS:  See vs page ?GENERAL: no distress ?NECK: There is no palpable thyroid enlargement.  No thyroid nodule is palpable.  No palpable lymphadenopathy at the anterior neck. ? ? ?   ?Assessment & Plan:  ?Hyperthyroidism: uncontrolled. ? ?Patient Instructions  ?Your blood pressure is high today.  Please see your primary care provider soon, to have it rechecked.   ?I have resent the prescription to your pharmacy, for the methimazole.  ?If ever you have  fever while taking methimazole, stop it and call us, even if the reason is obvious, because of the risk of a rare side-effect. ?It is best to never miss the medication.  However, if you do miss it, next best is to double up the next time.   ?Please continue the same propranolol.   ?Please come back for a follow-up appointment in 3 weeks.   ? ? ?

## 2022-01-06 ENCOUNTER — Ambulatory Visit (HOSPITAL_COMMUNITY)
Admission: RE | Admit: 2022-01-06 | Discharge: 2022-01-06 | Disposition: A | Discharge status: Home / Self Care | Payer: No Typology Code available for payment source | Source: Ambulatory Visit | Attending: Physician Assistant | Admitting: Physician Assistant

## 2022-01-06 ENCOUNTER — Other Ambulatory Visit: Payer: Self-pay

## 2022-01-06 DIAGNOSIS — C3481 Malignant neoplasm of overlapping sites of right bronchus and lung: Secondary | ICD-10-CM | POA: Diagnosis present

## 2022-01-06 MED ORDER — SODIUM CHLORIDE (PF) 0.9 % IJ SOLN
INTRAMUSCULAR | Status: AC
  Filled 2022-01-06: qty 50

## 2022-01-06 MED ORDER — IOHEXOL 300 MG/ML  SOLN
100.0000 mL | Freq: Once | INTRAMUSCULAR | Status: AC | PRN
  Administered 2022-01-06: 100 mL via INTRAVENOUS

## 2022-01-10 ENCOUNTER — Other Ambulatory Visit: Payer: Self-pay

## 2022-01-10 ENCOUNTER — Inpatient Hospital Stay: Payer: No Typology Code available for payment source

## 2022-01-10 ENCOUNTER — Inpatient Hospital Stay: Payer: No Typology Code available for payment source | Attending: Internal Medicine | Admitting: Internal Medicine

## 2022-01-10 VITALS — BP 144/105 | HR 94 | Temp 97.7°F | Resp 20 | Ht 71.0 in | Wt 211.5 lb

## 2022-01-10 DIAGNOSIS — C3411 Malignant neoplasm of upper lobe, right bronchus or lung: Secondary | ICD-10-CM | POA: Insufficient documentation

## 2022-01-10 DIAGNOSIS — Z5112 Encounter for antineoplastic immunotherapy: Secondary | ICD-10-CM | POA: Insufficient documentation

## 2022-01-10 DIAGNOSIS — Z95828 Presence of other vascular implants and grafts: Secondary | ICD-10-CM

## 2022-01-10 DIAGNOSIS — Z79899 Other long term (current) drug therapy: Secondary | ICD-10-CM | POA: Insufficient documentation

## 2022-01-10 DIAGNOSIS — C3481 Malignant neoplasm of overlapping sites of right bronchus and lung: Secondary | ICD-10-CM

## 2022-01-10 LAB — CMP (CANCER CENTER ONLY)
ALT: 19 U/L (ref 0–44)
AST: 20 U/L (ref 15–41)
Albumin: 4.4 g/dL (ref 3.5–5.0)
Alkaline Phosphatase: 54 U/L (ref 38–126)
Anion gap: 4 — ABNORMAL LOW (ref 5–15)
BUN: 15 mg/dL (ref 8–23)
CO2: 29 mmol/L (ref 22–32)
Calcium: 9.9 mg/dL (ref 8.9–10.3)
Chloride: 103 mmol/L (ref 98–111)
Creatinine: 0.94 mg/dL (ref 0.61–1.24)
GFR, Estimated: >60 mL/min (ref >60)
Glucose, Bld: 124 mg/dL — ABNORMAL HIGH (ref 70–99)
Potassium: 3.7 mmol/L (ref 3.5–5.1)
Sodium: 136 mmol/L (ref 135–145)
Total Bilirubin: 0.9 mg/dL (ref 0.3–1.2)
Total Protein: 7.4 g/dL (ref 6.5–8.1)

## 2022-01-10 LAB — CBC WITH DIFFERENTIAL (CANCER CENTER ONLY)
Abs Immature Granulocytes: 0.01 10*3/uL (ref 0.00–0.07)
Basophils Absolute: 0 10*3/uL (ref 0.0–0.1)
Basophils Relative: 0 %
Eosinophils Absolute: 0.3 10*3/uL (ref 0.0–0.5)
Eosinophils Relative: 3 %
HCT: 40.6 % (ref 39.0–52.0)
Hemoglobin: 15.2 g/dL (ref 13.0–17.0)
Immature Granulocytes: 0 %
Lymphocytes Relative: 24 %
Lymphs Abs: 1.9 10*3/uL (ref 0.7–4.0)
MCH: 34.4 pg — ABNORMAL HIGH (ref 26.0–34.0)
MCHC: 37.4 g/dL — ABNORMAL HIGH (ref 30.0–36.0)
MCV: 91.9 fL (ref 80.0–100.0)
Monocytes Absolute: 0.7 10*3/uL (ref 0.1–1.0)
Monocytes Relative: 9 %
Neutro Abs: 5.1 10*3/uL (ref 1.7–7.7)
Neutrophils Relative %: 64 %
Platelet Count: 111 10*3/uL — ABNORMAL LOW (ref 150–400)
RBC: 4.42 MIL/uL (ref 4.22–5.81)
RDW: 12.1 % (ref 11.5–15.5)
WBC Count: 8.1 10*3/uL (ref 4.0–10.5)
nRBC: 0 % (ref 0.0–0.2)

## 2022-01-10 LAB — TSH: TSH: 4.24 u[IU]/mL — ABNORMAL HIGH (ref 0.320–4.118)

## 2022-01-10 MED ORDER — SODIUM CHLORIDE 0.9% FLUSH
10.0000 mL | Freq: Once | INTRAVENOUS | Status: AC
  Administered 2022-01-10: 10 mL

## 2022-01-10 MED ORDER — SODIUM CHLORIDE 0.9 % IV SOLN: Freq: Once | INTRAVENOUS | Status: AC

## 2022-01-10 MED ORDER — SODIUM CHLORIDE 0.9 % IV SOLN
1500.0000 mg | Freq: Once | INTRAVENOUS | Status: AC
  Administered 2022-01-10: 1500 mg via INTRAVENOUS
  Filled 2022-01-10: qty 30

## 2022-01-10 MED ORDER — HEPARIN SOD (PORK) LOCK FLUSH 100 UNIT/ML IV SOLN
500.0000 [IU] | Freq: Once | INTRAVENOUS | Status: AC | PRN
  Administered 2022-01-10: 500 [IU]

## 2022-01-10 MED ORDER — SODIUM CHLORIDE 0.9% FLUSH
10.0000 mL | INTRAVENOUS | Status: DC | PRN
  Administered 2022-01-10: 10 mL

## 2022-01-10 NOTE — Progress Notes (Signed)
?    Austin Wells ?[phone]   [phone] ? ?OFFICE PROGRESS NOTE ? ?Clinic, [facility] ?[place] ?[place] [address] ? ?DIAGNOSIS: Extensive stage (T1b, N3, M1 a) small cell lung cancer presented with right upper lobe nodule in addition to multiple left lung nodule as well as bilateral hilar and mediastinal lymphadenopathy diagnosed in November 2022. ? ?PRIOR THERAPY: None ? ?CURRENT THERAPY: Systemic chemotherapy with carboplatin for AUC of 5 on day 1 and etoposide 100 Mg/M2 on days 1, 2 and 3 with Cosela for Myeloprotection as well as Imfinzi 1500 Mg IV every 3 weeks.  Started September 20, 2021.  Status post 5 cycles.  Starting from cycle #5 the patient is on maintenance treatment with Imfinzi 1500 Mg IV every 4 weeks. ? ?INTERVAL HISTORY: ?Austin Wells 76 y.o. male returns to the clinic today for follow-up visit accompanied by his wife.  The patient is feeling fine today with no concerning complaints.  He has been tolerating his treatment fairly well.  He denied having any chest pain, shortness of breath, cough or hemoptysis.  He denied having any fever or chills.  He has no nausea, vomiting, diarrhea or constipation.  He has no headache or visual changes.  He had repeat CT scan of the chest, abdomen and pelvis performed recently and he is here for evaluation and discussion of his discuss results. ? ? ?MEDICAL HISTORY: ?Past Medical History:  ?Diagnosis Date  ? Bipolar disorder ([place])   ? Depression   ? GERD (gastroesophageal reflux disease)   ? Hyperlipidemia   ? Hypertension   ? Orthostatic hypotension   ? Sleep apnea   ? has a cpap and doesn't use it  ? Syncope and collapse   ? Tobacco use   ? ? ?ALLERGIES:  has No Known Allergies. ? ?MEDICATIONS:  ?Current Outpatient Medications  ?Medication Sig Dispense Refill  ? ARIPiprazole (ABILIFY) 5 MG tablet Take 5 mg by mouth daily. For mood stabilization and depression    ? calcium-vitamin D (OSCAL  WITH D) 500-200 MG-UNIT tablet Take 1 tablet by mouth daily.    ? cyanocobalamin 500 MCG tablet Take 500 mcg by mouth daily. Vitamin B12    ? divalproex (DEPAKOTE) 500 MG DR tablet Take by mouth.    ? folic acid (FOLVITE) 1 MG tablet Take 1 tablet by mouth daily.    ? hydrochlorothiazide (HYDRODIURIL) 25 MG tablet Take 12.5 mg by mouth daily.    ? lidocaine-prilocaine (EMLA) cream Apply to the Port-A-Cath site 30-60-minute before chemotherapy 30 g 0  ? lisinopril (PRINIVIL,ZESTRIL) 40 MG tablet Take 1 tablet (40 mg total) by mouth daily. For hypertension. (Patient taking differently: Take 40 mg by mouth daily. For blood pressure)    ? MELATONIN PO Take 1 tablet by mouth at bedtime.    ? methimazole (TAPAZOLE) 10 MG tablet Take 2 tablets (20 mg total) by mouth 2 (two) times daily. 360 tablet 3  ? methimazole (TAPAZOLE) 10 MG tablet Take 2 tablets (20 mg total) by mouth 2 (two) times daily. 360 tablet 3  ? prochlorperazine (COMPAZINE) 10 MG tablet Take 1 tablet (10 mg total) by mouth every 6 (six) hours as needed for nausea or vomiting. 30 tablet 0  ? propranolol (INDERAL) 10 MG tablet Take 10 mg by mouth 2 (two) times daily.    ? rosuvastatin (CRESTOR) 40 MG tablet Take 40 mg by mouth daily.    ? sildenafil (VIAGRA) 100 MG tablet TAKE ONE-HALF  TABLET BY MOUTH AS INSTRUCTED (TAKE ONE HOUR PRIOR TO SEXUAL ACTIVITY) *DO NOT EXCEED ONE DOSE IN A 24 HOUR PERIOD*    ? Tiotropium Bromide-Olodaterol 2.5-2.5 MCG/ACT AERS INHALE 2 PUFFS BY MOUTH DAILY    ? traZODone (DESYREL) 150 MG tablet Take 150 mg by mouth at bedtime as needed for sleep.    ? venlafaxine XR (EFFEXOR-XR) 75 MG 24 hr capsule Take 75 mg by mouth daily with breakfast.    ? ?No current facility-administered medications for this visit.  ? ? ?SURGICAL HISTORY:  ?Past Surgical History:  ?Procedure Laterality Date  ? BRONCHIAL BIOPSY  08/30/2021  ? Procedure: BRONCHIAL BIOPSIES;  Surgeon: Garner Nash, DO;  Location: [facility];  Service: Pulmonary;;  ?  BRONCHIAL BRUSHINGS  08/30/2021  ? Procedure: BRONCHIAL BRUSHINGS;  Surgeon: Garner Nash, DO;  Location: [facility];  Service: Pulmonary;;  ? BRONCHIAL NEEDLE ASPIRATION BIOPSY  08/30/2021  ? Procedure: BRONCHIAL NEEDLE ASPIRATION BIOPSIES;  Surgeon: Garner Nash, DO;  Location: [facility];  Service: Pulmonary;;  ? BRONCHIAL WASHINGS  08/30/2021  ? Procedure: BRONCHIAL WASHINGS;  Surgeon: Garner Nash, DO;  Location: [facility];  Service: Pulmonary;;  ? COLONOSCOPY    ? FINE NEEDLE ASPIRATION  08/30/2021  ? Procedure: FINE NEEDLE ASPIRATION (FNA) LINEAR;  Surgeon: Garner Nash, DO;  Location: [facility];  Service: Pulmonary;;  ? HERNIA REPAIR    ? inquinal  ? IR IMAGING GUIDED PORT INSERTION  09/26/2021  ? VIDEO BRONCHOSCOPY WITH ENDOBRONCHIAL NAVIGATION Left 08/30/2021  ? Procedure: VIDEO BRONCHOSCOPY WITH ENDOBRONCHIAL NAVIGATION;  Surgeon: Garner Nash, DO;  Location: [facility];  Service: Pulmonary;  Laterality: Left;  ION w/ CIOS  ? VIDEO BRONCHOSCOPY WITH ENDOBRONCHIAL ULTRASOUND Left 08/30/2021  ? Procedure: VIDEO BRONCHOSCOPY WITH ENDOBRONCHIAL ULTRASOUND;  Surgeon: Garner Nash, DO;  Location: [facility];  Service: Pulmonary;  Laterality: Left;  ? VIDEO BRONCHOSCOPY WITH RADIAL ENDOBRONCHIAL ULTRASOUND  08/30/2021  ? Procedure: RADIAL ENDOBRONCHIAL ULTRASOUND;  Surgeon: Garner Nash, DO;  Location: [facility];  Service: Pulmonary;;  ? ? ?REVIEW OF SYSTEMS:  Constitutional: negative ?Eyes: negative ?Ears, nose, mouth, throat, and face: negative ?Respiratory: negative ?Cardiovascular: negative ?Gastrointestinal: negative ?Genitourinary:negative ?Integument/breast: negative ?Hematologic/lymphatic: negative ?Musculoskeletal:negative ?Neurological: negative ?Behavioral/Psych: negative ?Endocrine: negative ?Allergic/Immunologic: negative  ? ?PHYSICAL EXAMINATION: General appearance: alert, cooperative, and no distress ?Head: Normocephalic, without obvious abnormality,  atraumatic ?Neck: no adenopathy, no JVD, supple, symmetrical, trachea midline, and thyroid not enlarged, symmetric, no tenderness/mass/nodules ?Lymph nodes: Cervical, supraclavicular, and axillary nodes normal. ?Resp: clear to auscultation bilaterally ?Back: symmetric, no curvature. ROM normal. No CVA tenderness. ?Cardio: regular rate and rhythm, S1, S2 normal, no murmur, click, rub or gallop ?GI: soft, non-tender; bowel sounds normal; no masses,  no organomegaly ?Extremities: extremities normal, atraumatic, no cyanosis or edema ?Neurologic: Alert and oriented X 3, normal strength and tone. Normal symmetric reflexes. Normal coordination and gait ? ?ECOG PERFORMANCE STATUS: 1 - Symptomatic but completely ambulatory ? ?Blood pressure (!) 144/105, pulse 94, temperature 97.7 ?F (36.5 ?C), temperature source Tympanic, resp. rate 20, height 5\' 11"  (1.803 m), weight 211 lb 8 oz (95.9 kg), SpO2 99 %. ? ?LABORATORY DATA: ?Lab Results  ?Component Value Date  ? WBC 7.6 12/13/2021  ? HGB 13.6 12/13/2021  ? HCT 37.5 (L) 12/13/2021  ? MCV 91.9 12/13/2021  ? PLT 163 12/13/2021  ? ? ?  Chemistry   ?   ?Component Value Date/Time  ? NA 136 12/13/2021 1042  ? K 3.9 12/13/2021 1042  ?  CL 102 12/13/2021 1042  ? CO2 30 12/13/2021 1042  ? BUN 13 12/13/2021 1042  ? CREATININE 0.81 12/13/2021 1042  ?    ?Component Value Date/Time  ? CALCIUM 9.5 12/13/2021 1042  ? ALKPHOS 66 12/13/2021 1042  ? AST 19 12/13/2021 1042  ? ALT 23 12/13/2021 1042  ? BILITOT 0.6 12/13/2021 1042  ?  ? ? ? ?RADIOGRAPHIC STUDIES: ?CT Chest W Contrast ? ?Result Date: 01/07/2022 ?CLINICAL DATA:  Metastatic small-cell lung cancer, chemotherapy complete * Tracking Code: BO * EXAM: CT CHEST, ABDOMEN, AND PELVIS WITH CONTRAST TECHNIQUE: Multidetector CT imaging of the chest, abdomen and pelvis was performed following the standard protocol during bolus administration of intravenous contrast. RADIATION DOSE REDUCTION: This exam was performed according to the departmental  dose-optimization program which includes automated exposure control, adjustment of the mA and/or kV according to patient size and/or use of iterative reconstruction technique. CONTRAST:  169mL OMNIPAQUE IOHEXOL 300 MG/M

## 2022-01-10 NOTE — Patient Instructions (Signed)
[facility]  Discharge Instructions: Thank you for choosing [facility] to provide your oncology and hematology care.   If you have a lab appointment with the Cancer Center, please go directly to the Cancer Center and check in at the registration area.   Wear comfortable clothing and clothing appropriate for easy access to any Portacath or PICC line.   We strive to give you quality time with your provider. You may need to reschedule your appointment if you arrive late (15 or more minutes).  Arriving late affects you and other patients whose appointments are after yours.  Also, if you miss three or more appointments without notifying the office, you may be dismissed from the clinic at the provider's discretion.      For prescription refill requests, have your pharmacy contact our office and allow 72 hours for refills to be completed.    Today you received the following chemotherapy and/or immunotherapy agents Imfinzi      To help prevent nausea and vomiting after your treatment, we encourage you to take your nausea medication as directed.  BELOW ARE SYMPTOMS THAT SHOULD BE REPORTED IMMEDIATELY: *FEVER GREATER THAN 100.4 F (38 C) OR HIGHER *CHILLS OR SWEATING *NAUSEA AND VOMITING THAT IS NOT CONTROLLED WITH YOUR NAUSEA MEDICATION *UNUSUAL SHORTNESS OF BREATH *UNUSUAL BRUISING OR BLEEDING *URINARY PROBLEMS (pain or burning when urinating, or frequent urination) *BOWEL PROBLEMS (unusual diarrhea, constipation, pain near the anus) TENDERNESS IN MOUTH AND THROAT WITH OR WITHOUT PRESENCE OF ULCERS (sore throat, sores in mouth, or a toothache) UNUSUAL RASH, SWELLING OR PAIN  UNUSUAL VAGINAL DISCHARGE OR ITCHING   Items with * indicate a potential emergency and should be followed up as soon as possible or go to the Emergency Department if any problems should occur.  Please show the CHEMOTHERAPY ALERT CARD or IMMUNOTHERAPY ALERT CARD at check-in to the  Emergency Department and triage nurse.  Should you have questions after your visit or need to cancel or reschedule your appointment, please contact [facility]  Dept: [phone]  and follow the prompts.  Office hours are 8:00 a.m. to 4:30 p.m. Monday - Friday. Please note that voicemails left after 4:00 p.m. may not be returned until the following business day.  We are closed weekends and major holidays. You have access to a nurse at all times for urgent questions. Please call the main number to the clinic Dept: [phone] and follow the prompts.   For any non-urgent questions, you may also contact your provider using MyChart. We now offer e-Visits for anyone 18 and older to request care online for non-urgent symptoms. For details visit [email or web address].   Also download the MyChart app! Go to the app store, search "MyChart", open the app, select [facility], and log in with your MyChart username and password.  Due to Covid, a mask is required upon entering the hospital/clinic. If you do not have a mask, one will be given to you upon arrival. For doctor visits, patients may have 1 support Anneliese Leblond aged 74 or older with them. For treatment visits, patients cannot have anyone with them due to current Covid guidelines and our immunocompromised population.   

## 2022-01-20 ENCOUNTER — Ambulatory Visit (INDEPENDENT_AMBULATORY_CARE_PROVIDER_SITE_OTHER): Payer: No Typology Code available for payment source | Admitting: Endocrinology

## 2022-01-20 ENCOUNTER — Encounter: Payer: Self-pay | Admitting: Endocrinology

## 2022-01-20 VITALS — BP 142/88 | HR 72 | Ht 71.0 in | Wt 216.2 lb

## 2022-01-20 DIAGNOSIS — E0590 Thyrotoxicosis, unspecified without thyrotoxic crisis or storm: Secondary | ICD-10-CM

## 2022-01-20 NOTE — Progress Notes (Signed)
? ?Subjective:  ? ? Patient ID: Austin Wells, male    DOB: 01/04/1946, 76 y.o.   MRN: [id number] ? ?HPI ?Pt returns for f/u of hyperthyroidism (dx'ed 2022; he was rx'ed tapazole, but he sometimes misses; he has never had thyroid imaging; leukopenia resolved without change in rx; inderal is for tremor, not thyroid).  Tremor is now less now. He takes tapazole as rx'ed.   ?Past Medical History:  ?Diagnosis Date  ? Bipolar disorder ([place])   ? Depression   ? GERD (gastroesophageal reflux disease)   ? Hyperlipidemia   ? Hypertension   ? Orthostatic hypotension   ? Sleep apnea   ? has a cpap and doesn't use it  ? Syncope and collapse   ? Tobacco use   ? ? ?Past Surgical History:  ?Procedure Laterality Date  ? BRONCHIAL BIOPSY  08/30/2021  ? Procedure: BRONCHIAL BIOPSIES;  Surgeon: Garner Nash, DO;  Location: [facility];  Service: Pulmonary;;  ? BRONCHIAL BRUSHINGS  08/30/2021  ? Procedure: BRONCHIAL BRUSHINGS;  Surgeon: Garner Nash, DO;  Location: [facility];  Service: Pulmonary;;  ? BRONCHIAL NEEDLE ASPIRATION BIOPSY  08/30/2021  ? Procedure: BRONCHIAL NEEDLE ASPIRATION BIOPSIES;  Surgeon: Garner Nash, DO;  Location: [facility];  Service: Pulmonary;;  ? BRONCHIAL WASHINGS  08/30/2021  ? Procedure: BRONCHIAL WASHINGS;  Surgeon: Garner Nash, DO;  Location: [facility];  Service: Pulmonary;;  ? COLONOSCOPY    ? FINE NEEDLE ASPIRATION  08/30/2021  ? Procedure: FINE NEEDLE ASPIRATION (FNA) LINEAR;  Surgeon: Garner Nash, DO;  Location: [facility];  Service: Pulmonary;;  ? HERNIA REPAIR    ? inquinal  ? IR IMAGING GUIDED PORT INSERTION  09/26/2021  ? VIDEO BRONCHOSCOPY WITH ENDOBRONCHIAL NAVIGATION Left 08/30/2021  ? Procedure: VIDEO BRONCHOSCOPY WITH ENDOBRONCHIAL NAVIGATION;  Surgeon: Garner Nash, DO;  Location: [facility];  Service: Pulmonary;  Laterality: Left;  ION w/ CIOS  ? VIDEO BRONCHOSCOPY WITH ENDOBRONCHIAL ULTRASOUND Left 08/30/2021  ? Procedure: VIDEO BRONCHOSCOPY WITH ENDOBRONCHIAL  ULTRASOUND;  Surgeon: Garner Nash, DO;  Location: [facility];  Service: Pulmonary;  Laterality: Left;  ? VIDEO BRONCHOSCOPY WITH RADIAL ENDOBRONCHIAL ULTRASOUND  08/30/2021  ? Procedure: RADIAL ENDOBRONCHIAL ULTRASOUND;  Surgeon: Garner Nash, DO;  Location: [facility];  Service: Pulmonary;;  ? ? ?Social History  ? ?Socioeconomic History  ? Marital status: Married  ?  Spouse name: Not on file  ? Number of children: Not on file  ? Years of education: Not on file  ? Highest education level: Not on file  ?Occupational History  ? Not on file  ?Tobacco Use  ? Smoking status: Every Day  ?  Packs/day: 1.00  ?  Years: 55.00  ?  Pack years: 55.00  ?  Types: Cigarettes  ?  Start date: 35  ? Smokeless tobacco: Never  ? Tobacco comments:  ?  Currently smoking 1.5ppd as of 10/28/22ep  ?Vaping Use  ? Vaping Use: Never used  ?Substance and Sexual Activity  ? Alcohol use: Yes  ?  Comment: very rare  ? Drug use: Yes  ?  Types: "Crack" cocaine  ?  Comment: Patient stated he last used "last week" (week of Aug 23, 2021)  ? Sexual activity: Not on file  ?Other Topics Concern  ? Not on file  ?Social History Narrative  ? Not on file  ? ?Social Determinants of Health  ? ?Financial Resource Strain: Not on file  ?Food Insecurity: Not on file  ?Transportation Needs:  Not on file  ?Physical Activity: Not on file  ?Stress: Not on file  ?Social Connections: Not on file  ?Intimate Partner Violence: Not on file  ? ? ?Current Outpatient Medications on File Prior to Visit  ?Medication Sig Dispense Refill  ? ARIPiprazole (ABILIFY) 5 MG tablet Take 5 mg by mouth daily. For mood stabilization and depression    ? calcium-vitamin D (OSCAL WITH D) 500-200 MG-UNIT tablet Take 1 tablet by mouth daily.    ? cyanocobalamin 500 MCG tablet Take 500 mcg by mouth daily. Vitamin B12    ? divalproex (DEPAKOTE) 500 MG DR tablet Take by mouth.    ? folic acid (FOLVITE) 1 MG tablet Take 1 tablet by mouth daily.    ? hydrochlorothiazide (HYDRODIURIL) 25 MG  tablet Take 12.5 mg by mouth daily.    ? lidocaine-prilocaine (EMLA) cream Apply to the Port-A-Cath site 30-60-minute before chemotherapy 30 g 0  ? lisinopril (PRINIVIL,ZESTRIL) 40 MG tablet Take 1 tablet (40 mg total) by mouth daily. For hypertension. (Patient taking differently: Take 40 mg by mouth daily. For blood pressure)    ? MELATONIN PO Take 1 tablet by mouth at bedtime.    ? prochlorperazine (COMPAZINE) 10 MG tablet Take 1 tablet (10 mg total) by mouth every 6 (six) hours as needed for nausea or vomiting. 30 tablet 0  ? propranolol (INDERAL) 10 MG tablet Take 10 mg by mouth 2 (two) times daily.    ? rosuvastatin (CRESTOR) 40 MG tablet Take 40 mg by mouth daily.    ? sildenafil (VIAGRA) 100 MG tablet TAKE ONE-HALF TABLET BY MOUTH AS INSTRUCTED (TAKE ONE HOUR PRIOR TO SEXUAL ACTIVITY) *DO NOT EXCEED ONE DOSE IN A 24 HOUR PERIOD*    ? Tiotropium Bromide-Olodaterol 2.5-2.5 MCG/ACT AERS INHALE 2 PUFFS BY MOUTH DAILY    ? traZODone (DESYREL) 150 MG tablet Take 150 mg by mouth at bedtime as needed for sleep.    ? venlafaxine XR (EFFEXOR-XR) 75 MG 24 hr capsule Take 75 mg by mouth daily with breakfast.    ? ?No current facility-administered medications on file prior to visit.  ? ? ?No Known Allergies ? ?Family History  ?Problem Relation Age of Onset  ? Thyroid disease Neg Hx   ? ? ?BP (!) 142/88 (BP Location: Left Arm, Patient Position: Sitting, Cuff Size: Normal)   Pulse 72   Ht 5\' 11"  (1.803 m)   Wt 216 lb 3.2 oz (98.1 kg)   SpO2 96%   BMI 30.15 kg/m?  ? ? ?Review of Systems ?Denies fever.  ?   ?Objective:  ? Physical Exam ?VITAL SIGNS:  See vs page ?GENERAL: no distress ?NECK: There is no palpable thyroid enlargement.  No thyroid nodule is palpable.  No palpable lymphadenopathy at the anterior neck.   ? ? ?Lab Results  ?Component Value Date  ? TSH 21.30 (H) 01/20/2022  ? ?   ?Assessment & Plan:  ?Hyperthyroidism: overcontrolled.  D/c tapazole.  In 1 week, resume at 10 mg qd.  cancer ? ?

## 2022-01-20 NOTE — Patient Instructions (Addendum)
Your blood pressure is high today.  Please see your primary care provider soon, to have it rechecked.   ?If ever you have fever while taking methimazole, stop it and call us, even if the reason is obvious, because of the risk of a rare side-effect. ?It is best to never miss the medication.  However, if you do miss it, next best is to double up the next time.   ?Please come back for a follow-up appointment in 4-6 weeks.   ?

## 2022-01-21 LAB — TSH: TSH: 21.3 mIU/L — ABNORMAL HIGH (ref 0.40–4.50)

## 2022-01-21 LAB — T4, FREE: Free T4: 0.5 ng/dL — ABNORMAL LOW (ref 0.8–1.8)

## 2022-01-21 MED ORDER — METHIMAZOLE 10 MG PO TABS: 10.0000 mg | ORAL_TABLET | Freq: Every day | ORAL | 1 refills | Status: DC

## 2022-01-31 NOTE — Progress Notes (Signed)
[facility] ?OFFICE PROGRESS NOTE ? ?[facility], [facility] ?[address] ?[place] [address] ? ?DIAGNOSIS: Extensive stage (T1b, N3, M1 a) small cell lung cancer presented with right upper lobe nodule in addition to multiple left lung nodule as well as bilateral hilar and mediastinal lymphadenopathy diagnosed in November 2022. ? ?PRIOR THERAPY: None ? ?CURRENT THERAPY:  Systemic chemotherapy with carboplatin for AUC of 5 on day 1 and etoposide 100 Mg/M2 on days 1, 2 and 3 with Cosela for Myeloprotection as well as Imfinzi 1500 Mg IV every 3 weeks.  Started September 20, 2021.  Status post 6 cycles.  Starting from cycle #5, the patient started maintenance immunotherapy with Imfinzi. ? ?INTERVAL HISTORY: ?Austin Wells 76 y.o. male returns to the clinic today for a follow-up visit accompanied by his wife.  The patient is feeling fairly well today without any concerning complaints. He sees Dr. Loanne Drilling from endocrinology for his hyperthyroidism. He is currently on maintenance imfinzi. He has tolerated his chemotherapy previously well as well as immunotherapy. The patient denies any recent fever, chills, night sweats, or unexplained weight loss.  He reports a good appetite.  He denies any chest pain, cough, or hemoptysis.  He reports his baseline dyspnea on exertion. He reports fatigue but he is on several medications that can cause drowsiness. He does walk down and back from the driveway, which reportedly is almost the length of a football field, every day. Denies any nausea, vomiting, diarrhea, or constipation.  Denies any headache or visual changes.  Denies any rashes or skin changes. He took his anti-hypotensives about 45 minutes ago. The patient is here today for evaluation and repeat blood work before starting cycle #7.  ? ? ? ?MEDICAL HISTORY: ?Past Medical History:  ?Diagnosis Date  ? Bipolar disorder ([place])   ? Depression   ? GERD (gastroesophageal reflux disease)   ?  Hyperlipidemia   ? Hypertension   ? Orthostatic hypotension   ? Sleep apnea   ? has a cpap and doesn't use it  ? Syncope and collapse   ? Tobacco use   ? ? ?ALLERGIES:  has No Known Allergies. ? ?MEDICATIONS:  ?Current Outpatient Medications  ?Medication Sig Dispense Refill  ? ARIPiprazole (ABILIFY) 5 MG tablet Take 5 mg by mouth daily. For mood stabilization and depression    ? calcium-vitamin D (OSCAL WITH D) 500-200 MG-UNIT tablet Take 1 tablet by mouth daily.    ? cyanocobalamin 500 MCG tablet Take 500 mcg by mouth daily. Vitamin B12    ? divalproex (DEPAKOTE) 500 MG DR tablet Take by mouth.    ? folic acid (FOLVITE) 1 MG tablet Take 1 tablet by mouth daily.    ? hydrochlorothiazide (HYDRODIURIL) 25 MG tablet Take 12.5 mg by mouth daily.    ? lidocaine-prilocaine (EMLA) cream Apply to the Port-A-Cath site 30-60-minute before chemotherapy 30 g 0  ? lisinopril (PRINIVIL,ZESTRIL) 40 MG tablet Take 1 tablet (40 mg total) by mouth daily. For hypertension. (Patient taking differently: Take 40 mg by mouth daily. For blood pressure)    ? MELATONIN PO Take 1 tablet by mouth at bedtime.    ? methimazole (TAPAZOLE) 10 MG tablet Take 1 tablet (10 mg total) by mouth daily. 90 tablet 1  ? prochlorperazine (COMPAZINE) 10 MG tablet Take 1 tablet (10 mg total) by mouth every 6 (six) hours as needed for nausea or vomiting. 30 tablet 0  ? propranolol (INDERAL) 10 MG tablet Take 10 mg by mouth 2 (  two) times daily.    ? rosuvastatin (CRESTOR) 40 MG tablet Take 40 mg by mouth daily.    ? sildenafil (VIAGRA) 100 MG tablet TAKE ONE-HALF TABLET BY MOUTH AS INSTRUCTED (TAKE ONE HOUR PRIOR TO SEXUAL ACTIVITY) *DO NOT EXCEED ONE DOSE IN A 24 HOUR PERIOD*    ? Tiotropium Bromide-Olodaterol 2.5-2.5 MCG/ACT AERS INHALE 2 PUFFS BY MOUTH DAILY    ? traZODone (DESYREL) 150 MG tablet Take 150 mg by mouth at bedtime as needed for sleep.    ? venlafaxine XR (EFFEXOR-XR) 75 MG 24 hr capsule Take 75 mg by mouth daily with breakfast.    ? ?No current  facility-administered medications for this visit.  ? ? ?SURGICAL HISTORY:  ?Past Surgical History:  ?Procedure Laterality Date  ? BRONCHIAL BIOPSY  08/30/2021  ? Procedure: BRONCHIAL BIOPSIES;  Surgeon: Garner Nash, DO;  Location: [facility];  Service: Pulmonary;;  ? BRONCHIAL BRUSHINGS  08/30/2021  ? Procedure: BRONCHIAL BRUSHINGS;  Surgeon: Garner Nash, DO;  Location: [facility];  Service: Pulmonary;;  ? BRONCHIAL NEEDLE ASPIRATION BIOPSY  08/30/2021  ? Procedure: BRONCHIAL NEEDLE ASPIRATION BIOPSIES;  Surgeon: Garner Nash, DO;  Location: [facility];  Service: Pulmonary;;  ? BRONCHIAL WASHINGS  08/30/2021  ? Procedure: BRONCHIAL WASHINGS;  Surgeon: Garner Nash, DO;  Location: [facility];  Service: Pulmonary;;  ? COLONOSCOPY    ? FINE NEEDLE ASPIRATION  08/30/2021  ? Procedure: FINE NEEDLE ASPIRATION (FNA) LINEAR;  Surgeon: Garner Nash, DO;  Location: [facility];  Service: Pulmonary;;  ? HERNIA REPAIR    ? inquinal  ? IR IMAGING GUIDED PORT INSERTION  09/26/2021  ? VIDEO BRONCHOSCOPY WITH ENDOBRONCHIAL NAVIGATION Left 08/30/2021  ? Procedure: VIDEO BRONCHOSCOPY WITH ENDOBRONCHIAL NAVIGATION;  Surgeon: Garner Nash, DO;  Location: [facility];  Service: Pulmonary;  Laterality: Left;  ION w/ CIOS  ? VIDEO BRONCHOSCOPY WITH ENDOBRONCHIAL ULTRASOUND Left 08/30/2021  ? Procedure: VIDEO BRONCHOSCOPY WITH ENDOBRONCHIAL ULTRASOUND;  Surgeon: Garner Nash, DO;  Location: [facility];  Service: Pulmonary;  Laterality: Left;  ? VIDEO BRONCHOSCOPY WITH RADIAL ENDOBRONCHIAL ULTRASOUND  08/30/2021  ? Procedure: RADIAL ENDOBRONCHIAL ULTRASOUND;  Surgeon: Garner Nash, DO;  Location: [facility];  Service: Pulmonary;;  ? ? ?REVIEW OF SYSTEMS:   ?Constitutional: Negative for appetite change, chills, fatigue, fever and unexpected weight change.  ?HENT: Positive for hard of hearing. Negative for mouth sores, nosebleeds, sore throat and trouble swallowing.   ?Eyes: Negative for eye problems and  icterus.  ?Respiratory: Positive for baseline dyspnea on exertion. Negative for cough, hemoptysis, and wheezing.   ?Cardiovascular: Negative for chest pain and leg swelling.  ?Gastrointestinal: Negative for abdominal pain, constipation, diarrhea, nausea and vomiting.  ?Genitourinary: Negative for bladder incontinence, difficulty urinating, dysuria, frequency and hematuria.   ?Musculoskeletal: Negative for back pain, gait problem, neck pain and neck stiffness.  ?Skin: Negative for itching and rash.  ?Neurological: Negative for dizziness, extremity weakness, gait problem, headaches, light-headedness and seizures.  ?Hematological: Negative for adenopathy. Does not bruise/bleed easily except for one episode after stepping on a shard of glass.  ?Psychiatric/Behavioral: Negative for confusion, depression and sleep disturbance. The patient is not nervous/anxious.   ? ? ?PHYSICAL EXAMINATION:  ?There were no vitals taken for this visit. ? ?ECOG PERFORMANCE STATUS: 1 ? ?Physical Exam  ?Constitutional: Oriented to person, place, and time and well-developed, well-nourished, and in no distress.  ?HENT:  ?Head: Positive for hard of hearing. Normocephalic and atraumatic.  ?Mouth/Throat: Oropharynx is clear and moist.  No oropharyngeal exudate.  ?Eyes: Conjunctivae are normal. Right eye exhibits no discharge. Left eye exhibits no discharge. No scleral icterus.  ?Neck: Normal range of motion. Neck supple.  ?Cardiovascular: Normal rate, regular rhythm, normal heart sounds and intact distal pulses.   ?Pulmonary/Chest: Effort normal and breath sounds normal. No respiratory distress. No wheezes. No rales.  ?Abdominal: Soft. Bowel sounds are normal. Exhibits no distension and no mass. There is no tenderness.  ?Musculoskeletal: Normal range of motion. Exhibits no edema.  ?Lymphadenopathy:  ?  No cervical adenopathy.  ?Neurological: Alert and oriented to person, place, and time. Exhibits normal muscle tone. Gait normal. Coordination  normal.  ?Skin: Skin is warm and dry. No rash noted. Not diaphoretic. No erythema. No pallor.  ?Psychiatric: Mood, memory and judgment normal.  ?Vitals reviewed. ?  ? ?LABORATORY DATA: ?Lab Results  ?Component Va

## 2022-02-03 ENCOUNTER — Other Ambulatory Visit: Payer: Self-pay

## 2022-02-03 DIAGNOSIS — C3481 Malignant neoplasm of overlapping sites of right bronchus and lung: Secondary | ICD-10-CM

## 2022-02-07 ENCOUNTER — Other Ambulatory Visit: Payer: Self-pay

## 2022-02-07 ENCOUNTER — Encounter: Payer: Self-pay | Admitting: Physician Assistant

## 2022-02-07 ENCOUNTER — Inpatient Hospital Stay: Payer: No Typology Code available for payment source

## 2022-02-07 ENCOUNTER — Inpatient Hospital Stay (HOSPITAL_BASED_OUTPATIENT_CLINIC_OR_DEPARTMENT_OTHER): Payer: No Typology Code available for payment source | Admitting: Physician Assistant

## 2022-02-07 ENCOUNTER — Inpatient Hospital Stay: Payer: No Typology Code available for payment source | Attending: Internal Medicine

## 2022-02-07 VITALS — BP 150/100 | HR 79 | Temp 98.2°F | Resp 17 | Wt 220.2 lb

## 2022-02-07 DIAGNOSIS — I10 Essential (primary) hypertension: Secondary | ICD-10-CM | POA: Diagnosis not present

## 2022-02-07 DIAGNOSIS — Z5112 Encounter for antineoplastic immunotherapy: Secondary | ICD-10-CM | POA: Insufficient documentation

## 2022-02-07 DIAGNOSIS — E0590 Thyrotoxicosis, unspecified without thyrotoxic crisis or storm: Secondary | ICD-10-CM | POA: Insufficient documentation

## 2022-02-07 DIAGNOSIS — Z95828 Presence of other vascular implants and grafts: Secondary | ICD-10-CM

## 2022-02-07 DIAGNOSIS — C3481 Malignant neoplasm of overlapping sites of right bronchus and lung: Secondary | ICD-10-CM

## 2022-02-07 DIAGNOSIS — C3411 Malignant neoplasm of upper lobe, right bronchus or lung: Secondary | ICD-10-CM | POA: Insufficient documentation

## 2022-02-07 LAB — CBC WITH DIFFERENTIAL (CANCER CENTER ONLY)
Abs Immature Granulocytes: 0.01 10*3/uL (ref 0.00–0.07)
Basophils Absolute: 0 10*3/uL (ref 0.0–0.1)
Basophils Relative: 1 %
Eosinophils Absolute: 0.3 10*3/uL (ref 0.0–0.5)
Eosinophils Relative: 4 %
HCT: 43.4 % (ref 39.0–52.0)
Hemoglobin: 15.5 g/dL (ref 13.0–17.0)
Immature Granulocytes: 0 %
Lymphocytes Relative: 24 %
Lymphs Abs: 1.4 10*3/uL (ref 0.7–4.0)
MCH: 32.7 pg (ref 26.0–34.0)
MCHC: 35.7 g/dL (ref 30.0–36.0)
MCV: 91.6 fL (ref 80.0–100.0)
Monocytes Absolute: 0.3 10*3/uL (ref 0.1–1.0)
Monocytes Relative: 5 %
Neutro Abs: 3.8 10*3/uL (ref 1.7–7.7)
Neutrophils Relative %: 66 %
Platelet Count: 111 10*3/uL — ABNORMAL LOW (ref 150–400)
RBC: 4.74 MIL/uL (ref 4.22–5.81)
RDW: 11.3 % — ABNORMAL LOW (ref 11.5–15.5)
WBC Count: 5.8 10*3/uL (ref 4.0–10.5)
nRBC: 0 % (ref 0.0–0.2)

## 2022-02-07 LAB — CMP (CANCER CENTER ONLY)
ALT: 18 U/L (ref 0–44)
AST: 21 U/L (ref 15–41)
Albumin: 4.3 g/dL (ref 3.5–5.0)
Alkaline Phosphatase: 49 U/L (ref 38–126)
Anion gap: 5 (ref 5–15)
BUN: 17 mg/dL (ref 8–23)
CO2: 28 mmol/L (ref 22–32)
Calcium: 9.7 mg/dL (ref 8.9–10.3)
Chloride: 101 mmol/L (ref 98–111)
Creatinine: 1.07 mg/dL (ref 0.61–1.24)
GFR, Estimated: >60 mL/min (ref >60)
Glucose, Bld: 163 mg/dL — ABNORMAL HIGH (ref 70–99)
Potassium: 3.6 mmol/L (ref 3.5–5.1)
Sodium: 134 mmol/L — ABNORMAL LOW (ref 135–145)
Total Bilirubin: 1.2 mg/dL (ref 0.3–1.2)
Total Protein: 7.3 g/dL (ref 6.5–8.1)

## 2022-02-07 MED ORDER — SODIUM CHLORIDE 0.9% FLUSH
10.0000 mL | Freq: Once | INTRAVENOUS | Status: AC
  Administered 2022-02-07: 10 mL

## 2022-02-07 MED ORDER — SODIUM CHLORIDE 0.9 % IV SOLN: Freq: Once | INTRAVENOUS | Status: AC

## 2022-02-07 MED ORDER — SODIUM CHLORIDE 0.9 % IV SOLN
1500.0000 mg | Freq: Once | INTRAVENOUS | Status: AC
  Administered 2022-02-07: 1500 mg via INTRAVENOUS
  Filled 2022-02-07: qty 30

## 2022-02-07 MED ORDER — HEPARIN SOD (PORK) LOCK FLUSH 100 UNIT/ML IV SOLN
500.0000 [IU] | Freq: Once | INTRAVENOUS | Status: AC | PRN
  Administered 2022-02-07: 500 [IU]

## 2022-02-07 MED ORDER — SODIUM CHLORIDE 0.9% FLUSH
10.0000 mL | INTRAVENOUS | Status: DC | PRN
  Administered 2022-02-07: 10 mL

## 2022-03-03 ENCOUNTER — Ambulatory Visit: Payer: No Typology Code available for payment source | Admitting: Endocrinology

## 2022-03-06 ENCOUNTER — Telehealth: Payer: Self-pay | Admitting: Internal Medicine

## 2022-03-06 NOTE — Telephone Encounter (Signed)
Called patient regarding upcoming appointment, voicemail was full. ?

## 2022-03-07 ENCOUNTER — Inpatient Hospital Stay (HOSPITAL_BASED_OUTPATIENT_CLINIC_OR_DEPARTMENT_OTHER): Payer: No Typology Code available for payment source | Admitting: Internal Medicine

## 2022-03-07 ENCOUNTER — Inpatient Hospital Stay: Payer: No Typology Code available for payment source | Attending: Internal Medicine

## 2022-03-07 ENCOUNTER — Other Ambulatory Visit: Payer: Self-pay | Admitting: Internal Medicine

## 2022-03-07 ENCOUNTER — Other Ambulatory Visit: Payer: Self-pay | Admitting: Medical Oncology

## 2022-03-07 ENCOUNTER — Other Ambulatory Visit: Payer: Self-pay

## 2022-03-07 ENCOUNTER — Inpatient Hospital Stay: Payer: No Typology Code available for payment source

## 2022-03-07 ENCOUNTER — Encounter: Payer: Self-pay | Admitting: Internal Medicine

## 2022-03-07 VITALS — BP 147/124 | HR 83

## 2022-03-07 VITALS — BP 161/127 | HR 88 | Temp 97.5°F | Resp 18 | Wt 229.1 lb

## 2022-03-07 DIAGNOSIS — C3411 Malignant neoplasm of upper lobe, right bronchus or lung: Secondary | ICD-10-CM | POA: Diagnosis present

## 2022-03-07 DIAGNOSIS — I158 Other secondary hypertension: Secondary | ICD-10-CM

## 2022-03-07 DIAGNOSIS — E0590 Thyrotoxicosis, unspecified without thyrotoxic crisis or storm: Secondary | ICD-10-CM

## 2022-03-07 DIAGNOSIS — Z79899 Other long term (current) drug therapy: Secondary | ICD-10-CM | POA: Insufficient documentation

## 2022-03-07 DIAGNOSIS — Z639 Problem related to primary support group, unspecified: Secondary | ICD-10-CM

## 2022-03-07 DIAGNOSIS — Z5112 Encounter for antineoplastic immunotherapy: Secondary | ICD-10-CM | POA: Insufficient documentation

## 2022-03-07 DIAGNOSIS — C3481 Malignant neoplasm of overlapping sites of right bronchus and lung: Secondary | ICD-10-CM

## 2022-03-07 DIAGNOSIS — C3490 Malignant neoplasm of unspecified part of unspecified bronchus or lung: Secondary | ICD-10-CM

## 2022-03-07 DIAGNOSIS — Z95828 Presence of other vascular implants and grafts: Secondary | ICD-10-CM

## 2022-03-07 LAB — CMP (CANCER CENTER ONLY)
ALT: 21 U/L (ref 0–44)
AST: 29 U/L (ref 15–41)
Albumin: 4.4 g/dL (ref 3.5–5.0)
Alkaline Phosphatase: 51 U/L (ref 38–126)
Anion gap: 6 (ref 5–15)
BUN: 15 mg/dL (ref 8–23)
CO2: 29 mmol/L (ref 22–32)
Calcium: 9.6 mg/dL (ref 8.9–10.3)
Chloride: 100 mmol/L (ref 98–111)
Creatinine: 1.36 mg/dL — ABNORMAL HIGH (ref 0.61–1.24)
GFR, Estimated: 55 mL/min — ABNORMAL LOW (ref >60)
Glucose, Bld: 164 mg/dL — ABNORMAL HIGH (ref 70–99)
Potassium: 3.7 mmol/L (ref 3.5–5.1)
Sodium: 135 mmol/L (ref 135–145)
Total Bilirubin: 0.9 mg/dL (ref 0.3–1.2)
Total Protein: 7.6 g/dL (ref 6.5–8.1)

## 2022-03-07 LAB — CBC WITH DIFFERENTIAL (CANCER CENTER ONLY)
Abs Immature Granulocytes: 0.02 10*3/uL (ref 0.00–0.07)
Basophils Absolute: 0.1 10*3/uL (ref 0.0–0.1)
Basophils Relative: 1 %
Eosinophils Absolute: 0.2 10*3/uL (ref 0.0–0.5)
Eosinophils Relative: 3 %
HCT: 43.7 % (ref 39.0–52.0)
Hemoglobin: 16.4 g/dL (ref 13.0–17.0)
Immature Granulocytes: 0 %
Lymphocytes Relative: 25 %
Lymphs Abs: 1.7 10*3/uL (ref 0.7–4.0)
MCH: 34 pg (ref 26.0–34.0)
MCHC: 37.5 g/dL — ABNORMAL HIGH (ref 30.0–36.0)
MCV: 90.7 fL (ref 80.0–100.0)
Monocytes Absolute: 0.5 10*3/uL (ref 0.1–1.0)
Monocytes Relative: 7 %
Neutro Abs: 4.2 10*3/uL (ref 1.7–7.7)
Neutrophils Relative %: 64 %
Platelet Count: 121 10*3/uL — ABNORMAL LOW (ref 150–400)
RBC: 4.82 MIL/uL (ref 4.22–5.81)
RDW: 11.6 % (ref 11.5–15.5)
WBC Count: 6.7 10*3/uL (ref 4.0–10.5)
nRBC: 0 % (ref 0.0–0.2)

## 2022-03-07 LAB — TSH: TSH: 80.47 u[IU]/mL — ABNORMAL HIGH (ref 0.350–4.500)

## 2022-03-07 MED ORDER — LEVOTHYROXINE SODIUM 50 MCG PO TABS: 50.0000 ug | ORAL_TABLET | Freq: Every day | ORAL | 2 refills | Status: DC

## 2022-03-07 MED ORDER — SODIUM CHLORIDE 0.9% FLUSH
10.0000 mL | INTRAVENOUS | Status: DC | PRN
  Administered 2022-03-07: 10 mL

## 2022-03-07 MED ORDER — SODIUM CHLORIDE 0.9 % IV SOLN: Freq: Once | INTRAVENOUS | Status: AC

## 2022-03-07 MED ORDER — SODIUM CHLORIDE 0.9 % IV SOLN
1500.0000 mg | Freq: Once | INTRAVENOUS | Status: AC
  Administered 2022-03-07: 1500 mg via INTRAVENOUS
  Filled 2022-03-07: qty 30

## 2022-03-07 MED ORDER — HEPARIN SOD (PORK) LOCK FLUSH 100 UNIT/ML IV SOLN
500.0000 [IU] | Freq: Once | INTRAVENOUS | Status: AC | PRN
  Administered 2022-03-07: 500 [IU]

## 2022-03-07 MED ORDER — SODIUM CHLORIDE 0.9% FLUSH
10.0000 mL | Freq: Once | INTRAVENOUS | Status: AC
  Administered 2022-03-07: 10 mL

## 2022-03-07 MED ORDER — CLONIDINE HCL 0.1 MG PO TABS
0.1000 mg | ORAL_TABLET | Freq: Once | ORAL | Status: AC
  Administered 2022-03-07: 0.1 mg via ORAL
  Filled 2022-03-07: qty 1

## 2022-03-07 NOTE — Addendum Note (Signed)
Addended by: Ardeen Garland on: 03/07/2022 12:27 PM ? ? Modules accepted: Orders ? ?

## 2022-03-07 NOTE — Patient Instructions (Signed)
[facility]   Discharge Instructions: Thank you for choosing [facility] to provide your oncology and hematology care.   If you have a lab appointment with the Cancer Center, please go directly to the Cancer Center and check in at the registration area.   Wear comfortable clothing and clothing appropriate for easy access to any Portacath or PICC line.   We strive to give you quality time with your provider. You may need to reschedule your appointment if you arrive late (15 or more minutes).  Arriving late affects you and other patients whose appointments are after yours.  Also, if you miss three or more appointments without notifying the office, you may be dismissed from the clinic at the provider's discretion.      For prescription refill requests, have your pharmacy contact our office and allow 72 hours for refills to be completed.    Today you received the following chemotherapy and/or immunotherapy agents: Durvalumab (Imfinzi).      To help prevent nausea and vomiting after your treatment, we encourage you to take your nausea medication as directed.  BELOW ARE SYMPTOMS THAT SHOULD BE REPORTED IMMEDIATELY: *FEVER GREATER THAN 100.4 F (38 C) OR HIGHER *CHILLS OR SWEATING *NAUSEA AND VOMITING THAT IS NOT CONTROLLED WITH YOUR NAUSEA MEDICATION *UNUSUAL SHORTNESS OF BREATH *UNUSUAL BRUISING OR BLEEDING *URINARY PROBLEMS (pain or burning when urinating, or frequent urination) *BOWEL PROBLEMS (unusual diarrhea, constipation, pain near the anus) TENDERNESS IN MOUTH AND THROAT WITH OR WITHOUT PRESENCE OF ULCERS (sore throat, sores in mouth, or a toothache) UNUSUAL RASH, SWELLING OR PAIN  UNUSUAL VAGINAL DISCHARGE OR ITCHING   Items with * indicate a potential emergency and should be followed up as soon as possible or go to the Emergency Department if any problems should occur.  Please show the CHEMOTHERAPY ALERT CARD or IMMUNOTHERAPY ALERT CARD  at check-in to the Emergency Department and triage nurse.  Should you have questions after your visit or need to cancel or reschedule your appointment, please contact [facility]  Dept: [phone]  and follow the prompts.  Office hours are 8:00 a.m. to 4:30 p.m. Monday - Friday. Please note that voicemails left after 4:00 p.m. may not be returned until the following business day.  We are closed weekends and major holidays. You have access to a nurse at all times for urgent questions. Please call the main number to the clinic Dept: [phone] and follow the prompts.   For any non-urgent questions, you may also contact your provider using MyChart. We now offer e-Visits for anyone 18 and older to request care online for non-urgent symptoms. For details visit [email or web address].   Also download the MyChart app! Go to the app store, search "MyChart", open the app, select [facility], and log in with your MyChart username and password.  Due to Covid, a mask is required upon entering the hospital/clinic. If you do not have a mask, one will be given to you upon arrival. For doctor visits, patients may have 1 support person aged 18 or older with them. For treatment visits, patients cannot have anyone with them due to current Covid guidelines and our immunocompromised population.   

## 2022-03-07 NOTE — Progress Notes (Signed)
?    [facility] ?[phone]   [phone] ? ?OFFICE PROGRESS NOTE ? ?[facility] ?[address] ?[place] [address] ? ?DIAGNOSIS: Extensive stage (T1b, N3, M1 a) small cell lung cancer presented with right upper lobe nodule in addition to multiple left lung nodule as well as bilateral hilar and mediastinal lymphadenopathy diagnosed in November 2022. ? ?PRIOR THERAPY: None ? ?CURRENT THERAPY: Systemic chemotherapy with carboplatin for AUC of 5 on day 1 and etoposide 100 Mg/M2 on days 1, 2 and 3 with Cosela for Myeloprotection as well as Imfinzi 1500 Mg IV every 3 weeks.  Started September 20, 2021.  Status post 7 cycles.  Starting from cycle #5 the patient is on maintenance treatment with Imfinzi 1500 Mg IV every 4 weeks. ? ?INTERVAL HISTORY: ?Macari Zalesky 76 y.o. male returns to the clinic today for follow-up visit accompanied by his wife.  The patient has significant hearing deficit and he is not contributing much to the visit but he has no complaints according to the wife except for lack of concentration recently.  He left his car running and also the water running at home.  There is some change in his mental status recently.  He denied having any current chest pain, shortness of breath, cough or hemoptysis.  He has no nausea, vomiting, diarrhea or constipation.  He has no headache or visual changes.  He is here today for evaluation before starting cycle #8 of his treatment. ? ? ?MEDICAL HISTORY: ?Past Medical History:  ?Diagnosis Date  ? Bipolar disorder ([place])   ? Depression   ? GERD (gastroesophageal reflux disease)   ? Hyperlipidemia   ? Hypertension   ? Orthostatic hypotension   ? Sleep apnea   ? has a cpap and doesn't use it  ? Syncope and collapse   ? Tobacco use   ? ? ?ALLERGIES:  has No Known Allergies. ? ?MEDICATIONS:  ?Current Outpatient Medications  ?Medication Sig Dispense Refill  ? ARIPiprazole (ABILIFY) 5 MG tablet Take 5 mg by  mouth daily. For mood stabilization and depression    ? calcium-vitamin D (OSCAL WITH D) 500-200 MG-UNIT tablet Take 1 tablet by mouth daily.    ? cyanocobalamin 500 MCG tablet Take 500 mcg by mouth daily. Vitamin B12    ? divalproex (DEPAKOTE) 500 MG DR tablet Take by mouth.    ? folic acid (FOLVITE) 1 MG tablet Take 1 tablet by mouth daily.    ? hydrochlorothiazide (HYDRODIURIL) 25 MG tablet Take 12.5 mg by mouth daily.    ? lidocaine-prilocaine (EMLA) cream Apply to the Port-A-Cath site 30-60-minute before chemotherapy 30 g 0  ? lisinopril (PRINIVIL,ZESTRIL) 40 MG tablet Take 1 tablet (40 mg total) by mouth daily. For hypertension. (Patient taking differently: Take 40 mg by mouth daily. For blood pressure)    ? MELATONIN PO Take 1 tablet by mouth at bedtime.    ? methimazole (TAPAZOLE) 10 MG tablet Take 1 tablet (10 mg total) by mouth daily. 90 tablet 1  ? prochlorperazine (COMPAZINE) 10 MG tablet Take 1 tablet (10 mg total) by mouth every 6 (six) hours as needed for nausea or vomiting. 30 tablet 0  ? propranolol (INDERAL) 10 MG tablet Take 10 mg by mouth 2 (two) times daily.    ? rosuvastatin (CRESTOR) 40 MG tablet Take 40 mg by mouth daily.    ? sildenafil (VIAGRA) 100 MG tablet TAKE ONE-HALF TABLET BY MOUTH AS INSTRUCTED (TAKE ONE HOUR PRIOR TO  SEXUAL ACTIVITY) *DO NOT EXCEED ONE DOSE IN A 24 HOUR PERIOD*    ? Tiotropium Bromide-Olodaterol 2.5-2.5 MCG/ACT AERS INHALE 2 PUFFS BY MOUTH DAILY    ? traZODone (DESYREL) 150 MG tablet Take 150 mg by mouth at bedtime as needed for sleep.    ? venlafaxine XR (EFFEXOR-XR) 75 MG 24 hr capsule Take 75 mg by mouth daily with breakfast.    ? ?No current facility-administered medications for this visit.  ? ? ?SURGICAL HISTORY:  ?Past Surgical History:  ?Procedure Laterality Date  ? BRONCHIAL BIOPSY  08/30/2021  ? Procedure: BRONCHIAL BIOPSIES;  Surgeon: Garner Nash, DO;  Location: [facility];  Service: Pulmonary;;  ? BRONCHIAL BRUSHINGS  08/30/2021  ? Procedure:  BRONCHIAL BRUSHINGS;  Surgeon: Garner Nash, DO;  Location: [facility];  Service: Pulmonary;;  ? BRONCHIAL NEEDLE ASPIRATION BIOPSY  08/30/2021  ? Procedure: BRONCHIAL NEEDLE ASPIRATION BIOPSIES;  Surgeon: Garner Nash, DO;  Location: [facility];  Service: Pulmonary;;  ? BRONCHIAL WASHINGS  08/30/2021  ? Procedure: BRONCHIAL WASHINGS;  Surgeon: Garner Nash, DO;  Location: [facility];  Service: Pulmonary;;  ? COLONOSCOPY    ? FINE NEEDLE ASPIRATION  08/30/2021  ? Procedure: FINE NEEDLE ASPIRATION (FNA) LINEAR;  Surgeon: Garner Nash, DO;  Location: [facility];  Service: Pulmonary;;  ? HERNIA REPAIR    ? inquinal  ? IR IMAGING GUIDED PORT INSERTION  09/26/2021  ? VIDEO BRONCHOSCOPY WITH ENDOBRONCHIAL NAVIGATION Left 08/30/2021  ? Procedure: VIDEO BRONCHOSCOPY WITH ENDOBRONCHIAL NAVIGATION;  Surgeon: Garner Nash, DO;  Location: [facility];  Service: Pulmonary;  Laterality: Left;  ION w/ CIOS  ? VIDEO BRONCHOSCOPY WITH ENDOBRONCHIAL ULTRASOUND Left 08/30/2021  ? Procedure: VIDEO BRONCHOSCOPY WITH ENDOBRONCHIAL ULTRASOUND;  Surgeon: Garner Nash, DO;  Location: [facility];  Service: Pulmonary;  Laterality: Left;  ? VIDEO BRONCHOSCOPY WITH RADIAL ENDOBRONCHIAL ULTRASOUND  08/30/2021  ? Procedure: RADIAL ENDOBRONCHIAL ULTRASOUND;  Surgeon: Garner Nash, DO;  Location: [facility];  Service: Pulmonary;;  ? ? ?REVIEW OF SYSTEMS:  A comprehensive review of systems was negative except for: Constitutional: positive for fatigue ?Ears, nose, mouth, throat, and face: positive for hearing loss ?Neurological: positive for coordination problems and memory problems  ? ?PHYSICAL EXAMINATION: General appearance: alert, cooperative, fatigued, and no distress ?Head: Normocephalic, without obvious abnormality, atraumatic ?Neck: no adenopathy, no JVD, supple, symmetrical, trachea midline, and thyroid not enlarged, symmetric, no tenderness/mass/nodules ?Lymph nodes: Cervical, supraclavicular, and axillary  nodes normal. ?Resp: clear to auscultation bilaterally ?Back: symmetric, no curvature. ROM normal. No CVA tenderness. ?Cardio: regular rate and rhythm, S1, S2 normal, no murmur, click, rub or gallop ?GI: soft, non-tender; bowel sounds normal; no masses,  no organomegaly ?Extremities: extremities normal, atraumatic, no cyanosis or edema ? ?ECOG PERFORMANCE STATUS: 1 - Symptomatic but completely ambulatory ? ?Blood pressure (!) 161/127, pulse 88, temperature (!) 97.5 ?F (36.4 ?C), temperature source Tympanic, resp. rate 18, weight 229 lb 2 oz (103.9 kg), SpO2 99 %. ? ?LABORATORY DATA: ?Lab Results  ?Component Value Date  ? WBC 6.7 03/07/2022  ? HGB 16.4 03/07/2022  ? HCT 43.7 03/07/2022  ? MCV 90.7 03/07/2022  ? PLT 121 (L) 03/07/2022  ? ? ?  Chemistry   ?   ?Component Value Date/Time  ? NA 134 (L) 02/07/2022 1126  ? K 3.6 02/07/2022 1126  ? CL 101 02/07/2022 1126  ? CO2 28 02/07/2022 1126  ? BUN 17 02/07/2022 1126  ? CREATININE 1.07 02/07/2022 1126  ?    ?  Component Value Date/Time  ? CALCIUM 9.7 02/07/2022 1126  ? ALKPHOS 49 02/07/2022 1126  ? AST 21 02/07/2022 1126  ? ALT 18 02/07/2022 1126  ? BILITOT 1.2 02/07/2022 1126  ?  ? ? ? ?RADIOGRAPHIC STUDIES: ?No results found. ? ?ASSESSMENT AND PLAN: This is a very pleasant 76 years old white male diagnosed with extensive stage (T1b, N3, M1 a) small cell lung cancer presented with right upper lobe lung nodule in addition to multiple left lung nodules and bilateral and mediastinal lymphadenopathy diagnosed in November 2022. ?The patient is currently undergoing systemic chemotherapy with carboplatin for AUC of 5 on day 1, etoposide 100 Mg/M2 on days 1, 2 and 3 with Cosela 240 Mg/M2 for Myeloprotection before chemotherapy as well as Imfinzi 1500 Mg IV every 3 weeks with chemo.  He is status post 7 cycle of treatment.  Starting from cycle #5 the patient is on treatment with single agent Imfinzi 1500 Mg IV every 4 weeks. ?The patient has been tolerating his treatment with  immunotherapy fairly well but recently has a lot of changes in his mental status. ?I recommended for him to proceed with cycle #8 today as planned. ?For the mental status change, I will order stat MRI of the brai

## 2022-03-07 NOTE — Progress Notes (Signed)
Per Dr. Julien Nordmann - okay to proceed with treatment with elevated BP. Pt to received clonidine. ? ?Patient's BP still elevated upon rechecks. MD made aware.  ?Per Dr. Julien Nordmann - okay to continue with treatment and for discharge. Patient is to follow-up with primary care MD for further evaluation and management of hypertension. ?Additional education provided to patient regarding hypertension management. Patient knows that he is to follow-up with PCP and to take his home BP medications appropriately. Pt verbalized an understanding of the education. ?Pt discharged in stable condition. Pt remained asymptomatic.  ?

## 2022-03-08 ENCOUNTER — Encounter: Payer: Self-pay | Admitting: Licensed Clinical Social Worker

## 2022-03-08 DIAGNOSIS — C3481 Malignant neoplasm of overlapping sites of right bronchus and lung: Secondary | ICD-10-CM

## 2022-03-08 NOTE — Progress Notes (Signed)
[facility]  ?Initial Assessment ? ? ?Austin Wells is a 76 y.o. year old male contacted caregiver by phone. Clinical Social Work was referred by nurse for assessment of psychosocial needs.  ? ?SDOH (Social Determinants of Health) assessments performed: Yes ?  ?SDOH Screenings  ? ?Alcohol Screen: Not on file  ?Depression (PHQ2-9): Not on file  ?Financial Resource Strain: Not on file  ?Food Insecurity: Not on file  ?Housing: Not on file  ?Physical Activity: Not on file  ?Social Connections: Not on file  ?Stress: Not on file  ?Tobacco Use: High Risk  ? Smoking Tobacco Use: Every Day  ? Smokeless Tobacco Use: Never  ? Passive Exposure: Not on file  ?Transportation Needs: Not on file  ? ? ? ?Distress Screen completed: No ?   ? View : No data to display.  ?  ?  ?  ? ? ? ? ?Family/Social Information:  ?Housing Arrangement: patient lives with his spouse Austin Wells).   ?Family members/support persons in your life? Per spouse pt has children from a previous relationship that reside "all over the country" and do not provide any form of support for pt.  Spouse has a daughter who resides in [place] with whom she speaks multiple times a day; however, pt's spouse is the only person providing physical care for pt.  The neighbor has assisted from time to time if needed and pt's spouse does have a good friend she can call in an emergency. ?Transportation concerns: no  ?Employment: Disabled Pt disabled due to mental health diagnosis of bi-polar disorder.  ?Income source: Social Security Disability ?Financial concerns:  Per spouse pt has VA benefits which are listed in the chart; however, pt also has Medicare which is not listed.  According to spouse pt lost his Medicare card and she does not know the number.  Spouse given a contact number for the Department of Social Security to inquire about steps to obtain Medicare number and re-issue card.  In the interim pt is receiving numerous medical bills as his VA benefits do not  cover all of his care.  ?Type of concern: Medical bills ?Food access concerns: no ?Religious or spiritual practice: Not known ?Services Currently in place:  [place] benefits, SSD ? ?Coping/ Adjustment to diagnosis: ?Patient understands treatment plan and what happens next? Per spouse pt has significantly declined in the past 3 weeks.  Pt reportedly has always had night terrors which has caused him to scream through the night, but this has become significantly worse in the past three weeks and spouse has been unable to sleep as a result of needing to try to calm pt overnight.  Pt also has become increasingly forgetful, leaving the car running after getting out, as well as walking away from the water while it was still running.  Spouse informed CSW pt has a long history of crack cocaine addiction and would disappear for days at a time on a "bender".  Pt's current behavior reportedly mimics his behavior when he was using.  Spouse states she does not believe it is possible he is using at this time as he does not leave the house.  Pt also has a PMhx of bi-polar disorder for which he takes medication.  Spouse reports compliance w/ medication.  Spouse did state that the only time pt would go off his psychiatric medication was when he was on a bender.  Pt is being scheduled for an MRI to check if there is progression of disease.  ?  Concerns about diagnosis and/or treatment:  Spouse is significantly concerned about providing care to pt as he declines as it is causing her an enormous amount of anxiety and she is unsure if she will be able to continue to provide his care if his behavior continues to escalate. ?Patient reported stressors:  Spouse reported the main stressor at the moment is pt's decline in overall status and her ability to continue to look after pt. ?Hopes and/or priorities:  ?Patient enjoys  ?Current coping skills/ strengths:  ? ? ? SUMMARY: ?Current SDOH Barriers:  ?Limited social support ? ?Clinical Social Work  Clinical Goal(s):  ?CSW goals are to connect spouse to community agencies to rectify the issues created by pt's lost Medicare card, assist spouse w/ processing possible progression of disease as well as future planning for pt's care.   ? ?Interventions: ?Discussed common feeling and emotions when being diagnosed with cancer, and the importance of support during treatment ?Informed patient of the support team roles and support services at [facility] ?Provided CSW contact information and encouraged patient to call with any questions or concerns ?Referred patient to financial counselor to determine if pt may be eligible for [employer].  Provided spouse w/ contact information for the Department of Social Security to find out the necessary steps to obtain pt's Medicare number and have a new card issued.  Spoke at length w/ spouse regarding future planning w/ regards to financial concerns as well as emotional concerns.  Supportive counseling provided. ? ? ?Follow Up Plan:  CSW will follow up w/ spouse next week to offer additional emotional support. ?Patient verbalizes understanding of plan: Yes ? ? ? ?Henriette Combs, LCSW ? ? ?

## 2022-03-09 ENCOUNTER — Encounter: Payer: Self-pay | Admitting: Internal Medicine

## 2022-03-09 ENCOUNTER — Other Ambulatory Visit: Payer: Self-pay | Admitting: Medical Oncology

## 2022-03-09 ENCOUNTER — Telehealth: Payer: Self-pay | Admitting: Medical Oncology

## 2022-03-09 NOTE — Progress Notes (Unsigned)
Called patient's spouse Quita Skye to follow up on financial concerns discussed with Education officer, museum.  Introduced myself as Arboriculturist and to listen and offer resources.  Patient states her husband lost his Medicare card and they have to go to the office to get a new one and has been receiving bills. Advised once she has received card, to call th number on the bills to provide Medicare number. She verbalized understanding.  Discussed one-time $1000 Radio broadcast assistant to assist with personal expenses while going through treatment. Based on verbal income guidelines, they do qualify. She will drop off income when she gets a chance to be scanned to me and to receive paperwork to be completed. She will then call me at earliest convenience to discuss grant details. My card will be included with my contact name and number.

## 2022-03-09 NOTE — Telephone Encounter (Signed)
TSH elevated-Per Dr. Julien Nordmann , I told pts wife  that his TSH was elevated secondary to his immunotherapy .  She said Dr Loanne Drilling prescribed Methimazole and he goes back next Tuesday for f/u with provider Loanne Drilling retired.  Wife notified not to pick up levothyroxine rx. It is cancelled.   LVM re this information to Dr Cordelia Pen clinical staff so he can follow pt for his thyroid function.

## 2022-03-13 ENCOUNTER — Ambulatory Visit (HOSPITAL_COMMUNITY)
Admission: RE | Admit: 2022-03-13 | Discharge: 2022-03-13 | Disposition: A | Discharge status: Home / Self Care | Payer: No Typology Code available for payment source | Source: Ambulatory Visit | Attending: Internal Medicine | Admitting: Internal Medicine

## 2022-03-13 DIAGNOSIS — C3490 Malignant neoplasm of unspecified part of unspecified bronchus or lung: Secondary | ICD-10-CM | POA: Insufficient documentation

## 2022-03-13 MED ORDER — GADOBUTROL 1 MMOL/ML IV SOLN
10.0000 mL | Freq: Once | INTRAVENOUS | Status: AC | PRN
  Administered 2022-03-13: 10 mL via INTRAVENOUS

## 2022-03-14 ENCOUNTER — Ambulatory Visit (INDEPENDENT_AMBULATORY_CARE_PROVIDER_SITE_OTHER): Payer: No Typology Code available for payment source | Admitting: Endocrinology

## 2022-03-14 ENCOUNTER — Encounter: Payer: Self-pay | Admitting: Endocrinology

## 2022-03-14 VITALS — BP 138/88 | HR 86 | Ht 71.0 in | Wt 228.0 lb

## 2022-03-14 DIAGNOSIS — Z8639 Personal history of other endocrine, nutritional and metabolic disease: Secondary | ICD-10-CM

## 2022-03-14 DIAGNOSIS — E032 Hypothyroidism due to medicaments and other exogenous substances: Secondary | ICD-10-CM

## 2022-03-14 MED ORDER — LEVOTHYROXINE SODIUM 75 MCG PO TABS: 75.0000 ug | ORAL_TABLET | Freq: Every day | ORAL | 0 refills | Status: DC

## 2022-03-14 NOTE — Progress Notes (Addendum)
Patient ID: Starling Christofferson, male   DOB: 1945-11-17, 76 y.o.   MRN: [id number]                                                                                                               Reason for Appointment: Follow-up of thyroid    Chief complaint: Tiredness   History of Present Illness:   The patient's history is somewhat unclear but apparently was diagnosed to have hyperthyroidism when he was having routine labs done by his oncologist in 11/22 Does not appear to have had any symptoms of palpitations, sweating, unusual weight loss at that time He was having fatigue related to his cancer treatments  Although he has been treated with methimazole no baseline free T4 level is available TSH was suppressed between 11/22 and 2/23 Patient is not able to give a good history because of hearing loss and most of his history is obtained through his wife  Patient was given as much as 20 mg twice daily of methimazole on his first visit in 2/23 However he does not think he started this until a couple of weeks later at least He did not feel different with starting this medication  When he was seen in follow-up in 3/23 he was not having any new symptoms or unusual fatigue because of high TSH he was told to stop his methimazole for 10 days and then reduce the dose to 10 mg instead of 20  In the last 3 weeks he has been feeling more fatigued and has significant somnolence, difficulty remembering and overall mental functioning He has gained 13 pounds since March No cold intolerance  Wt Readings from Last 3 Encounters:  03/14/22 228 lb (103.4 kg)  03/07/22 229 lb 2 oz (103.9 kg)  02/07/22 220 lb 3.2 oz (99.9 kg)     Treatments so far:  Thyroid function tests as follows:     Lab Results  Component Value Date   FREET4 0.5 (L) 01/20/2022   TSH 80.470 (H) 03/07/2022   TSH 21.30 (H) 01/20/2022   TSH 4.240 (H) 01/10/2022   Lab Results  Component Value Date   TSH 80.470 (H) 03/07/2022   TSH  21.30 (H) 01/20/2022   TSH 4.240 (H) 01/10/2022   TSH <0.080 (L) 12/06/2021   TSH <0.080 (L) 11/22/2021   TSH <0.080 (L) 11/01/2021   TSH <0.080 (L) 10/11/2021   TSH <0.010 (L) 09/20/2021   TSH 1.569 06/17/2013    No results found for: THYROTRECAB   Allergies as of 03/14/2022   No Known Allergies      Medication List        Accurate as of Mar 14, 2022  4:47 PM. If you have any questions, ask your nurse or doctor.          ARIPiprazole 5 MG tablet Commonly known as: ABILIFY Take 5 mg by mouth daily. For mood stabilization and depression   calcium-vitamin D 500-200 MG-UNIT tablet Commonly known as: OSCAL WITH D Take 1 tablet by mouth daily.  divalproex 500 MG DR tablet Commonly known as: DEPAKOTE Take by mouth.   folic acid 1 MG tablet Commonly known as: FOLVITE Take 1 tablet by mouth daily.   hydrochlorothiazide 25 MG tablet Commonly known as: HYDRODIURIL Take 12.5 mg by mouth daily.   levothyroxine 75 MCG tablet Commonly known as: SYNTHROID Take 1 tablet (75 mcg total) by mouth daily. On empty stomach Started by: Elayne Snare, MD   lidocaine-prilocaine cream Commonly known as: EMLA Apply to the Port-A-Cath site 30-60-minute before chemotherapy   lisinopril 40 MG tablet Commonly known as: ZESTRIL Take 1 tablet (40 mg total) by mouth daily. For hypertension. What changed: additional instructions   MELATONIN PO Take 1 tablet by mouth at bedtime.   methimazole 10 MG tablet Commonly known as: TAPAZOLE Take 1 tablet (10 mg total) by mouth daily.   prochlorperazine 10 MG tablet Commonly known as: COMPAZINE Take 1 tablet (10 mg total) by mouth every 6 (six) hours as needed for nausea or vomiting.   propranolol 10 MG tablet Commonly known as: INDERAL Take 10 mg by mouth 2 (two) times daily.   rosuvastatin 40 MG tablet Commonly known as: CRESTOR Take 40 mg by mouth daily.   sildenafil 100 MG tablet Commonly known as: VIAGRA TAKE ONE-HALF TABLET  BY MOUTH AS INSTRUCTED (TAKE ONE HOUR PRIOR TO SEXUAL ACTIVITY) *DO NOT EXCEED ONE DOSE IN A 24 HOUR PERIOD*   Tiotropium Bromide-Olodaterol 2.5-2.5 MCG/ACT Aers INHALE 2 PUFFS BY MOUTH DAILY   traZODone 150 MG tablet Commonly known as: DESYREL Take 150 mg by mouth at bedtime as needed for sleep.   venlafaxine XR 75 MG 24 hr capsule Commonly known as: EFFEXOR-XR Take 75 mg by mouth daily with breakfast.   vitamin B-12 500 MCG tablet Commonly known as: CYANOCOBALAMIN Take 500 mcg by mouth daily. Vitamin B12            Past Medical History:  Diagnosis Date   Bipolar disorder ([place])    Depression    GERD (gastroesophageal reflux disease)    Hyperlipidemia    Hypertension    Orthostatic hypotension    Sleep apnea    has a cpap and doesn't use it   Syncope and collapse    Tobacco use     Past Surgical History:  Procedure Laterality Date   BRONCHIAL BIOPSY  08/30/2021   Procedure: BRONCHIAL BIOPSIES;  Surgeon: Garner Nash, DO;  Location: [facility];  Service: Pulmonary;;   BRONCHIAL BRUSHINGS  08/30/2021   Procedure: BRONCHIAL BRUSHINGS;  Surgeon: Garner Nash, DO;  Location: [facility];  Service: Pulmonary;;   BRONCHIAL NEEDLE ASPIRATION BIOPSY  08/30/2021   Procedure: BRONCHIAL NEEDLE ASPIRATION BIOPSIES;  Surgeon: Garner Nash, DO;  Location: [facility];  Service: Pulmonary;;   BRONCHIAL WASHINGS  08/30/2021   Procedure: BRONCHIAL WASHINGS;  Surgeon: Garner Nash, DO;  Location: [facility];  Service: Pulmonary;;   COLONOSCOPY     FINE NEEDLE ASPIRATION  08/30/2021   Procedure: FINE NEEDLE ASPIRATION (FNA) LINEAR;  Surgeon: Garner Nash, DO;  Location: [facility];  Service: Pulmonary;;   HERNIA REPAIR     inquinal   IR IMAGING GUIDED PORT INSERTION  09/26/2021   VIDEO BRONCHOSCOPY WITH ENDOBRONCHIAL NAVIGATION Left 08/30/2021   Procedure: VIDEO BRONCHOSCOPY WITH ENDOBRONCHIAL NAVIGATION;  Surgeon: Garner Nash, DO;  Location: [facility];   Service: Pulmonary;  Laterality: Left;  ION w/ CIOS   VIDEO BRONCHOSCOPY WITH ENDOBRONCHIAL ULTRASOUND Left 08/30/2021   Procedure: VIDEO BRONCHOSCOPY  WITH ENDOBRONCHIAL ULTRASOUND;  Surgeon: Garner Nash, DO;  Location: [facility];  Service: Pulmonary;  Laterality: Left;   VIDEO BRONCHOSCOPY WITH RADIAL ENDOBRONCHIAL ULTRASOUND  08/30/2021   Procedure: RADIAL ENDOBRONCHIAL ULTRASOUND;  Surgeon: Garner Nash, DO;  Location: [facility];  Service: Pulmonary;;    Family History  Problem Relation Age of Onset   Thyroid disease Neg Hx     Social History:  reports that he has been smoking cigarettes. He started smoking about 56 years ago. He has a 55.00 pack-year smoking history. He has never used smokeless tobacco. He reports current alcohol use. He reports current drug use. Drug: "Crack" cocaine.  Allergies: No Known Allergies   Review of Systems  Respiratory:  Positive for shortness of breath.   Endocrine: Positive for fatigue.      Examination:   BP 138/88   Pulse 86   Ht 5\' 11"  (1.803 m)   Wt 228 lb (103.4 kg)   SpO2 98%   BMI 31.80 kg/m    General Appearance:  well-built and nourished, pleasant, not anxious       Eyes: No abnormal prominence, lid lag or stare present.  No swelling of the eyelids   Neck: The thyroid is not palpable   There is no lymphadenopathy in the neck .   Extremities: No ankle edema.  Neurological:  No fine tremors are present. Deep tendon reflexes at biceps are showing mild slowing of the relaxation.  Skin: No rash   Assessment/Plan:   Hyperthyroidism, by history, etiology unclear He may have had suppressed TSH with unknown degree of hyperthyroidism in 11/22 from his immunotherapy As above baseline free T4 level is not available  However even with taking only 10 mg methimazole in the last month or so he is profoundly hypothyroid both symptomatically and with TSH about 80 No thyroid enlargement  Since it is unclear whether he  truly has persistent hyperthyroidism we will continue to hold his methimazole Also since he is significantly hypothyroid with symptoms he will be given levothyroxine 75 mcg daily for 2 weeks   he will be seen back in follow-up in 3 weeks   Khadar Monger 03/14/2022, 4:47 PM    Note: This office note was prepared with Dragon voice recognition system technology. Any transcriptional errors that result from this process are unintentional.

## 2022-03-27 ENCOUNTER — Encounter: Payer: Self-pay | Admitting: Physician Assistant

## 2022-03-27 NOTE — Progress Notes (Signed)
[place] [address]  DIAGNOSIS: Extensive stage (T1b, N3, M1 a) small cell lung cancer presented with right upper lobe nodule in addition to multiple left lung nodule as well as bilateral hilar and mediastinal lymphadenopathy diagnosed in November 2022.  PRIOR THERAPY: Systemic chemotherapy with carboplatin for AUC of 5 on day 1 and etoposide 100 Mg/M2 on days 1, 2 and 3 with Cosela for Myeloprotection as well as Imfinzi 1500 Mg IV every 3 weeks.  Started September 20, 2021.  Status post 8 cycles.  Starting from cycle #5, the patient started maintenance immunotherapy with Imfinzi. Last dose on 03/07/22  CURRENT THERAPY: Palliative systemic chemotherapy with Zepzelca 3.2 mg/m2 IV every 3 weeks. First dose 04/11/22.   INTERVAL HISTORY: Austin Wells 76 y.o. male returns to the clinic today for a follow-up visit accompanied by his wife.  The patient is feeling fairly well today without any concerning complaints. He saw Dr. Loanne Drilling from endocrinology for his hyperthyroidism, who has recently retired. Therefore, he is going to establish with his new endocrinologist later this week. He is currently on maintenance imfinzi.  He is tolerating this well without any concerning complaints except for fatigue.  At the patient's last appointment with Dr. Julien Nordmann, his wife was endorsing decreased concentration. He had a stat brain MRI which was negative for metastatic disease.   The patient's blood pressure was elevated today but he is asymptomatic.  He believes he took lisinopril and propranolol today as prescribed.  His antihypertensives are prescribed by the [place].  They are unsure who the primary care provider is at the [place].  The patient denies any dizziness, visual changes, speech changes, or chest pain.  The patient denies any recent fever, chills, night sweats, or unexplained weight loss.  He reports a good  appetite.  He denies any chest pain, cough, or hemoptysis.  He reports his baseline dyspnea on exertion.  Denies any nausea, vomiting, diarrhea, or constipation.  Denies any headache or visual changes.  Denies any rashes or skin changes.  He recently had a CT scan of the chest, abdomen, pelvis.  He is here for evaluation and to review his scan before stating cycle #9.       MEDICAL HISTORY: Past Medical History:  Diagnosis Date   Bipolar disorder ([place])    Depression    GERD (gastroesophageal reflux disease)    Hyperlipidemia    Hypertension    Orthostatic hypotension    Sleep apnea    has a cpap and doesn't use it   Syncope and collapse    Tobacco use     ALLERGIES:  has No Known Allergies.  MEDICATIONS:  Current Outpatient Medications  Medication Sig Dispense Refill   ARIPiprazole (ABILIFY) 5 MG tablet Take 5 mg by mouth daily. For mood stabilization and depression     calcium-vitamin D (OSCAL WITH D) 500-200 MG-UNIT tablet Take 1 tablet by mouth daily.     cyanocobalamin 500 MCG tablet Take 500 mcg by mouth daily. Vitamin B12     divalproex (DEPAKOTE) 500 MG DR tablet Take by mouth.     folic acid (FOLVITE) 1 MG tablet Take 1 tablet by mouth daily.     hydrochlorothiazide (HYDRODIURIL) 25 MG tablet Take 12.5 mg by mouth daily.     levothyroxine (SYNTHROID) 75 MCG tablet Take 1 tablet (75 mcg total) by mouth daily. On empty stomach 14 tablet 0   lidocaine-prilocaine (EMLA)  cream Apply to the Port-A-Cath site 30-60-minute before chemotherapy 30 g 0   lisinopril (PRINIVIL,ZESTRIL) 40 MG tablet Take 1 tablet (40 mg total) by mouth daily. For hypertension. (Patient taking differently: Take 40 mg by mouth daily. For blood pressure)     MELATONIN PO Take 1 tablet by mouth at bedtime.     methimazole (TAPAZOLE) 10 MG tablet Take 1 tablet (10 mg total) by mouth daily. 90 tablet 1   prochlorperazine (COMPAZINE) 10 MG tablet Take 1 tablet (10 mg total) by mouth every 6 (six) hours as  needed for nausea or vomiting. 30 tablet 0   propranolol (INDERAL) 10 MG tablet Take 10 mg by mouth 2 (two) times daily.     rosuvastatin (CRESTOR) 40 MG tablet Take 40 mg by mouth daily.     sildenafil (VIAGRA) 100 MG tablet TAKE ONE-HALF TABLET BY MOUTH AS INSTRUCTED (TAKE ONE HOUR PRIOR TO SEXUAL ACTIVITY) *DO NOT EXCEED ONE DOSE IN A 24 HOUR PERIOD*     Tiotropium Bromide-Olodaterol 2.5-2.5 MCG/ACT AERS INHALE 2 PUFFS BY MOUTH DAILY     traZODone (DESYREL) 150 MG tablet Take 150 mg by mouth at bedtime as needed for sleep.     venlafaxine XR (EFFEXOR-XR) 75 MG 24 hr capsule Take 75 mg by mouth daily with breakfast.     No current facility-administered medications for this visit.   Facility-Administered Medications Ordered in Other Visits  Medication Dose Route Frequency Provider Last Rate Last Admin   sodium chloride flush (NS) 0.9 % injection 10 mL  10 mL Intravenous PRN Exzavier Ruderman L, PA-C   10 mL at 04/04/22 1127    SURGICAL HISTORY:  Past Surgical History:  Procedure Laterality Date   BRONCHIAL BIOPSY  08/30/2021   Procedure: BRONCHIAL BIOPSIES;  Surgeon: Garner Nash, DO;  Location: [facility];  Service: Pulmonary;;   BRONCHIAL BRUSHINGS  08/30/2021   Procedure: BRONCHIAL BRUSHINGS;  Surgeon: Garner Nash, DO;  Location: [facility];  Service: Pulmonary;;   BRONCHIAL NEEDLE ASPIRATION BIOPSY  08/30/2021   Procedure: BRONCHIAL NEEDLE ASPIRATION BIOPSIES;  Surgeon: Garner Nash, DO;  Location: [facility];  Service: Pulmonary;;   BRONCHIAL WASHINGS  08/30/2021   Procedure: BRONCHIAL WASHINGS;  Surgeon: Garner Nash, DO;  Location: [facility];  Service: Pulmonary;;   COLONOSCOPY     FINE NEEDLE ASPIRATION  08/30/2021   Procedure: FINE NEEDLE ASPIRATION (FNA) LINEAR;  Surgeon: Garner Nash, DO;  Location: [facility];  Service: Pulmonary;;   HERNIA REPAIR     inquinal   IR IMAGING GUIDED PORT INSERTION  09/26/2021   VIDEO BRONCHOSCOPY WITH  ENDOBRONCHIAL NAVIGATION Left 08/30/2021   Procedure: VIDEO BRONCHOSCOPY WITH ENDOBRONCHIAL NAVIGATION;  Surgeon: Garner Nash, DO;  Location: [facility];  Service: Pulmonary;  Laterality: Left;  ION w/ CIOS   VIDEO BRONCHOSCOPY WITH ENDOBRONCHIAL ULTRASOUND Left 08/30/2021   Procedure: VIDEO BRONCHOSCOPY WITH ENDOBRONCHIAL ULTRASOUND;  Surgeon: Garner Nash, DO;  Location: [facility];  Service: Pulmonary;  Laterality: Left;   VIDEO BRONCHOSCOPY WITH RADIAL ENDOBRONCHIAL ULTRASOUND  08/30/2021   Procedure: RADIAL ENDOBRONCHIAL ULTRASOUND;  Surgeon: Garner Nash, DO;  Location: [facility];  Service: Pulmonary;;    REVIEW OF SYSTEMS:   Review of Systems  Constitutional: Positive for fatigue.  Negative for appetite change, chills, fever and unexpected weight change.  HENT: Negative for mouth sores, nosebleeds, sore throat and trouble swallowing.   Eyes: Negative for eye problems and icterus.  Respiratory: Positive for baseline dyspnea on exertion.  Negative for cough, hemoptysis, and wheezing.    Cardiovascular: Negative for chest pain and leg swelling.  Gastrointestinal: Negative for abdominal pain, constipation, diarrhea, nausea and vomiting.  Genitourinary: Negative for bladder incontinence, difficulty urinating, dysuria, frequency and hematuria.   Musculoskeletal: Negative for back pain, gait problem, neck pain and neck stiffness.  Skin: Negative for itching and rash.  Neurological: Negative for dizziness, extremity weakness, gait problem, headaches, light-headedness and seizures.  Hematological: Negative for adenopathy. Does not bruise/bleed easily.  Psychiatric/Behavioral: Negative for confusion, depression and sleep disturbance. The patient is not nervous/anxious.     PHYSICAL EXAMINATION:  Blood pressure (!) 147/119, pulse 80, temperature (!) 97.5 F (36.4 C), temperature source Tympanic, resp. rate 20, height 5\' 11"  (1.803 m), weight 223 lb (101.2 kg), SpO2 99 %.  ECOG  PERFORMANCE STATUS: 1  Physical Exam  Constitutional: Oriented to person, place, and time and well-developed, well-nourished, and in no distress.  HENT:  Head: Normocephalic and atraumatic.  Mouth/Throat: Oropharynx is clear and moist. No oropharyngeal exudate.  Eyes: Conjunctivae are normal. Right eye exhibits no discharge. Left eye exhibits no discharge. No scleral icterus.  Neck: Normal range of motion. Neck supple.  Cardiovascular: Normal rate, regular rhythm, normal heart sounds and intact distal pulses.   Pulmonary/Chest: Effort normal and breath sounds normal. No respiratory distress. No wheezes. No rales.  Abdominal: Soft. Bowel sounds are normal. Exhibits no distension and no mass. There is no tenderness.  Musculoskeletal: Normal range of motion. Exhibits no edema.  Lymphadenopathy:    No cervical adenopathy.  Neurological: Alert and oriented to person, place, and time. Exhibits normal muscle tone. Gait normal. Coordination normal.  Skin: Skin is warm and dry. No rash noted. Not diaphoretic. No erythema. No pallor.  Psychiatric: Mood, memory and judgment normal.  Vitals reviewed.  LABORATORY DATA: Lab Results  Component Value Date   WBC 7.2 04/04/2022   HGB 15.5 04/04/2022   HCT 42.0 04/04/2022   MCV 91.9 04/04/2022   PLT 122 (L) 04/04/2022      Chemistry      Component Value Date/Time   NA 135 04/04/2022 1012   K 3.6 04/04/2022 1012   CL 101 04/04/2022 1012   CO2 29 04/04/2022 1012   BUN 19 04/04/2022 1012   CREATININE 1.22 04/04/2022 1012      Component Value Date/Time   CALCIUM 10.5 (H) 04/04/2022 1012   ALKPHOS 51 04/04/2022 1012   AST 25 04/04/2022 1012   ALT 20 04/04/2022 1012   BILITOT 1.1 04/04/2022 1012       RADIOGRAPHIC STUDIES:  CT Chest W Contrast  Result Date: 04/01/2022 CLINICAL DATA:  Small-cell lung cancer. Restaging. * Tracking Code: BO * EXAM: CT CHEST, ABDOMEN, AND PELVIS WITH CONTRAST TECHNIQUE: Multidetector CT imaging of the chest,  abdomen and pelvis was performed following the standard protocol during bolus administration of intravenous contrast. RADIATION DOSE REDUCTION: This exam was performed according to the departmental dose-optimization program which includes automated exposure control, adjustment of the mA and/or kV according to patient size and/or use of iterative reconstruction technique. CONTRAST:  182mL OMNIPAQUE IOHEXOL 300 MG/ML  SOLN COMPARISON:  01/06/2022 FINDINGS: CT CHEST FINDINGS Cardiovascular: The heart size is normal. No substantial pericardial effusion. There is mild atherosclerotic calcification of the abdominal aorta without aneurysm. Right Port-A-Cath tip is positioned in the mid SVC. Mass-effect from worsening left hilar lymphadenopathy attenuates pulmonary arterial and venous anatomy in the left hilum. Given the central necrosis in this lymphadenopathy, the appearance mimics pulmonary  embolus although no definite filling defects can be identified in the pulmonary arterial anatomy when reviewing coronal and sagittal reformations. Mediastinum/Nodes: Interval progression of left hilar lymphadenopathy. 12 mm anterior left hilar lymph node seen previously is now 23 mm short axis with central necrosis. Clustered necrotic lymphadenopathy in the left hilum attenuates pulmonary arterial anatomy and the superior left pulmonary vein. This hilar lymphadenopathy is progressive since prior with 14 mm short axis left hilar node on 28/2 increased from 7 mm previously (remeasured). Precarinal and subcarinal mild lymphadenopathy is similar to prior. Small lymph nodes are seen in the right hilum. The esophagus has normal imaging features. There is no axillary lymphadenopathy. Lungs/Pleura: Subpleural reticulation noted in both lungs compatible with underlying pulmonary fibrosis. 9 mm subpleural left lower lobe nodule identified previously is now 22 mm on image 82/4. Subpleural anterior right upper lobe index nodule measured  previously at 9 x 8 mm is similar at 8 mm today (52/4). No new suspicious pulmonary nodule or mass. No pleural effusion. Musculoskeletal: No worrisome lytic or sclerotic osseous abnormality. CT ABDOMEN PELVIS FINDINGS Hepatobiliary: Calcified gallstone evident. No suspicious focal abnormality within the liver parenchyma. There is no evidence for gallstones, gallbladder wall thickening, or pericholecystic fluid. Pancreas: No focal mass lesion. No dilatation of the main duct. No intraparenchymal cyst. No peripancreatic edema. Spleen: No splenomegaly. No focal mass lesion. Adrenals/Urinary Tract: No adrenal nodule or mass. Tiny cyst noted right kidney. Left kidney unremarkable. No evidence for hydroureter. The urinary bladder appears normal for the degree of distention. Stomach/Bowel: Stomach is unremarkable. No gastric wall thickening. No evidence of outlet obstruction. Duodenum is normally positioned as is the ligament of Treitz. No small bowel wall thickening. No small bowel dilatation. The terminal ileum is normal. The appendix is not well visualized, but there is no edema or inflammation in the region of the cecum. No gross colonic mass. No colonic wall thickening. Vascular/Lymphatic: There is mild atherosclerotic calcification of the abdominal aorta without aneurysm. There is no gastrohepatic or hepatoduodenal ligament lymphadenopathy. No retroperitoneal or mesenteric lymphadenopathy. No pelvic sidewall lymphadenopathy. Reproductive: The prostate gland and seminal vesicles are unremarkable. Other: No intraperitoneal free fluid. Musculoskeletal: No worrisome lytic or sclerotic osseous abnormality. IMPRESSION: 1. Interval progression of left hilar lymphadenopathy with central necrosis. Mass-effect from the lymphadenopathy attenuates pulmonary arterial and venous anatomy in the left hilum. 2. Similar appearance of mild mediastinal lymphadenopathy. 3. Interval progression of subpleural left lower lobe pulmonary  nodule, now measuring 22 mm. This represents the hypermetabolic nodule seen on PET imaging 06/10/2021. 4. Stable appearance of index right upper lobe pulmonary nodule. 5. No evidence for metastatic disease in the abdomen or pelvis. 6. Cholelithiasis. 7. Aortic Atherosclerosis (ICD10-I70.0). Electronically Signed   By: Misty Stanley M.D.   On: 04/01/2022 13:37   CT Abdomen Pelvis W Contrast  Result Date: 04/01/2022 CLINICAL DATA:  Small-cell lung cancer. Restaging. * Tracking Code: BO * EXAM: CT CHEST, ABDOMEN, AND PELVIS WITH CONTRAST TECHNIQUE: Multidetector CT imaging of the chest, abdomen and pelvis was performed following the standard protocol during bolus administration of intravenous contrast. RADIATION DOSE REDUCTION: This exam was performed according to the departmental dose-optimization program which includes automated exposure control, adjustment of the mA and/or kV according to patient size and/or use of iterative reconstruction technique. CONTRAST:  174mL OMNIPAQUE IOHEXOL 300 MG/ML  SOLN COMPARISON:  01/06/2022 FINDINGS: CT CHEST FINDINGS Cardiovascular: The heart size is normal. No substantial pericardial effusion. There is mild atherosclerotic calcification of the abdominal aorta without aneurysm.  Right Port-A-Cath tip is positioned in the mid SVC. Mass-effect from worsening left hilar lymphadenopathy attenuates pulmonary arterial and venous anatomy in the left hilum. Given the central necrosis in this lymphadenopathy, the appearance mimics pulmonary embolus although no definite filling defects can be identified in the pulmonary arterial anatomy when reviewing coronal and sagittal reformations. Mediastinum/Nodes: Interval progression of left hilar lymphadenopathy. 12 mm anterior left hilar lymph node seen previously is now 23 mm short axis with central necrosis. Clustered necrotic lymphadenopathy in the left hilum attenuates pulmonary arterial anatomy and the superior left pulmonary vein. This  hilar lymphadenopathy is progressive since prior with 14 mm short axis left hilar node on 28/2 increased from 7 mm previously (remeasured). Precarinal and subcarinal mild lymphadenopathy is similar to prior. Small lymph nodes are seen in the right hilum. The esophagus has normal imaging features. There is no axillary lymphadenopathy. Lungs/Pleura: Subpleural reticulation noted in both lungs compatible with underlying pulmonary fibrosis. 9 mm subpleural left lower lobe nodule identified previously is now 22 mm on image 82/4. Subpleural anterior right upper lobe index nodule measured previously at 9 x 8 mm is similar at 8 mm today (52/4). No new suspicious pulmonary nodule or mass. No pleural effusion. Musculoskeletal: No worrisome lytic or sclerotic osseous abnormality. CT ABDOMEN PELVIS FINDINGS Hepatobiliary: Calcified gallstone evident. No suspicious focal abnormality within the liver parenchyma. There is no evidence for gallstones, gallbladder wall thickening, or pericholecystic fluid. Pancreas: No focal mass lesion. No dilatation of the main duct. No intraparenchymal cyst. No peripancreatic edema. Spleen: No splenomegaly. No focal mass lesion. Adrenals/Urinary Tract: No adrenal nodule or mass. Tiny cyst noted right kidney. Left kidney unremarkable. No evidence for hydroureter. The urinary bladder appears normal for the degree of distention. Stomach/Bowel: Stomach is unremarkable. No gastric wall thickening. No evidence of outlet obstruction. Duodenum is normally positioned as is the ligament of Treitz. No small bowel wall thickening. No small bowel dilatation. The terminal ileum is normal. The appendix is not well visualized, but there is no edema or inflammation in the region of the cecum. No gross colonic mass. No colonic wall thickening. Vascular/Lymphatic: There is mild atherosclerotic calcification of the abdominal aorta without aneurysm. There is no gastrohepatic or hepatoduodenal ligament lymphadenopathy.  No retroperitoneal or mesenteric lymphadenopathy. No pelvic sidewall lymphadenopathy. Reproductive: The prostate gland and seminal vesicles are unremarkable. Other: No intraperitoneal free fluid. Musculoskeletal: No worrisome lytic or sclerotic osseous abnormality. IMPRESSION: 1. Interval progression of left hilar lymphadenopathy with central necrosis. Mass-effect from the lymphadenopathy attenuates pulmonary arterial and venous anatomy in the left hilum. 2. Similar appearance of mild mediastinal lymphadenopathy. 3. Interval progression of subpleural left lower lobe pulmonary nodule, now measuring 22 mm. This represents the hypermetabolic nodule seen on PET imaging 06/10/2021. 4. Stable appearance of index right upper lobe pulmonary nodule. 5. No evidence for metastatic disease in the abdomen or pelvis. 6. Cholelithiasis. 7. Aortic Atherosclerosis (ICD10-I70.0). Electronically Signed   By: Misty Stanley M.D.   On: 04/01/2022 13:37   MR Brain W Wo Contrast  Result Date: 03/13/2022 CLINICAL DATA:  Provided history: Malignant neoplasm of unspecified part of unspecified bronchus or lung. Small cell lung cancer, staging. EXAM: MRI HEAD WITHOUT AND WITH CONTRAST TECHNIQUE: Multiplanar, multiecho pulse sequences of the brain and surrounding structures were obtained without and with intravenous contrast. CONTRAST:  76mL GADAVIST GADOBUTROL 1 MMOL/ML IV SOLN COMPARISON:  CT 07/16/2019. FINDINGS: Brain: Mild generalized parenchymal atrophy. Multifocal T2 FLAIR hyperintense signal abnormality within the cerebral white matter, nonspecific but  compatible with mild chronic small vessel ischemic disease. There is no acute infarct. No evidence of an intracranial mass. No extra-axial fluid collection. No midline shift. No pathologic intracranial enhancement identified. Vascular: Maintained flow voids within the proximal large arterial vessels. Skull and upper cervical spine: No focal suspicious marrow lesion. Sinuses/Orbits: No  mass or acute finding within the imaged orbits. Minimal mucosal thickening within the left frontal and bilateral ethmoid sinuses. Other: Fluid within the mastoid air cells (right greater than left). IMPRESSION: No evidence of intracranial metastatic disease. Mild chronic small vessel image changes within the cerebral white matter. Mild generalized parenchymal atrophy. Mild mucosal thickening within the paranasal sinuses, as described. Fluid within the mastoid air cells (right greater than left). Electronically Signed   By: Kellie Simmering D.O.   On: 03/13/2022 20:39     ASSESSMENT/PLAN:  This is a very pleasant 76 year old Caucasian male diagnosed with extensive stage (T1b, N3, M1 a) small cell lung cancer.  He presented with a right upper lobe lung nodule in addition to multiple left lung nodules and bilateral mediastinal lymphadenopathy.  He was diagnosed in November 2022.  The patient is currently undergoing palliative systemic chemotherapy with carboplatin for an AUC of 5 on day 1, etoposide 100 mg per metered squared on days 1, 2, and 3 and Imfinzi on day 115 mg IV every 3 weeks.  He receives [employer] for myeloprotection.  He is status post 7 cycles.  Starting from cycle #5, the patient has been on single agent immunotherapy with Imfinzi IV every 4 weeks. He tolerated treatment very well without any concerning adverse side effects of significant lab abnormalities.    Dr. Julien Nordmann personally and independently reviewed his brain MRI and CT scan and discussed the results with the patient.  The brain MRI did not show any evidence of metastatic disease to the brain.  The patient's restaging CT scan showed evidence of disease progression.  Dr. Julien Nordmann had a lengthy discussion with the patient today about his current condition and treatment options.  Dr. Julien Nordmann discussed that we will need to switch his treatment to chemotherapy with Zepzelca 3.2 mg per metered squared IV every 3 weeks.  Dr. Julien Nordmann discussed the  adverse side effects of treatment including fatigue, myelosuppression, nausea, vomiting, renal, and liver dysfunction.  We will need to monitor his labs closely on a weekly basis.  The patient's wife mentioned that they will be out of town from 7/1 to 7/12.  I gave the patient's wife the orders for a CBC and a CMP and instructed them to get his labs drawn at [facility] and to have them faxed to the clinic.  We will see him back for follow-up visit around 7/13 for cycle #2  The patient's blood pressure was elevated today.  Reviewed his blood pressure with Dr. Julien Nordmann. The patient states he took his lisinopril and propranolol today.  His blood pressure is managed by the [place].  He was given 0.2 mg of clonidine.  His blood pressure still continued to be 147/119.  We will reach out to the [place] to call the patient to adjust his blood pressure regimen and asked that they get in touch with the patient soon the patient is asymptomatic.  I reviewed signs and symptoms that would warrant emergency room evaluation including dizziness, headaches, visual changes, balance changes, speech changes, shortness of breath, and chest discomfort.   He will see his new endocrinologist later this week for his hyperthyroidism.   The patient was advised to  call immediately if he has any concerning symptoms in the interval. The patient voices understanding of current disease status and treatment options and is in agreement with the current care plan. All questions were answered. The patient knows to call the clinic with any problems, questions or concerns. We can certainly see the patient much sooner if necessary  Orders Placed This Encounter  Procedures   CBC with Differential ([facility] Only)    Standing Status:   Standing    Number of Occurrences:   10    Standing Expiration Date:   04/05/2023   CMP ([facility] only)    Standing Status:   Standing    Number of Occurrences:   10    Standing Expiration Date:   04/05/2023      Tobe Sos Cathyann Kilfoyle, PA-C 04/04/22  ADDENDUM: Hematology/Oncology Attending: I had a face-to-face encounter with the patient today.  I reviewed his record, lab, scan and recommended his care plan.  This is a very pleasant 76 years old white male diagnosed with extensive stage small cell lung cancer in November 2022 status post palliative systemic chemotherapy with carboplatin, etoposide, Imfinzi as well as Cosela for 4 cycles followed by maintenance treatment with Imfinzi every 4 weeks status post 3 more cycles. He was tolerating his treatment with immunotherapy fairly well with no concerning adverse effects. The patient had repeat CT scan of the chest, abdomen and pelvis performed recently.  I personally and independently reviewed the scan images and discussed the results with the patient and his wife. Unfortunately his scan showed evidence for disease progression of the left hilar lymphadenopathy as well as progression of the subpleural left lower lobe pulmonary nodule. I recommended for the patient to discontinue his current treatment with Imfinzi at this point. I discussed with the patient other treatment options including palliative care versus proceeding with second line systemic chemotherapy with Zepzelca (lurbinectedin) 3.2 Mg/M2 every 3 weeks. The patient and his wife are interested in proceeding with additional treatment. I discussed with him the adverse effect of this treatment including but not limited to alopecia, myelosuppression, nausea and vomiting, peripheral neuropathy, liver or renal dysfunction. The patient is expected to start the first dose of this treatment next week. He will come back for follow-up visit in 2 weeks for evaluation and discussion of any adverse effect of his treatment. The patient was advised to call immediately if he has any other concerning symptoms in the interval. The total time spent in the appointment was 30 minutes. Disclaimer: This note was  dictated with voice recognition software. Similar sounding words can inadvertently be transcribed and may be missed upon review. Eilleen Kempf, MD

## 2022-04-01 ENCOUNTER — Ambulatory Visit (HOSPITAL_COMMUNITY)
Admission: RE | Admit: 2022-04-01 | Discharge: 2022-04-01 | Disposition: A | Discharge status: Home / Self Care | Payer: No Typology Code available for payment source | Source: Ambulatory Visit | Attending: Internal Medicine | Admitting: Internal Medicine

## 2022-04-01 DIAGNOSIS — C3490 Malignant neoplasm of unspecified part of unspecified bronchus or lung: Secondary | ICD-10-CM | POA: Diagnosis present

## 2022-04-01 MED ORDER — IOHEXOL 300 MG/ML  SOLN
100.0000 mL | Freq: Once | INTRAMUSCULAR | Status: AC | PRN
  Administered 2022-04-01: 100 mL via INTRAVENOUS

## 2022-04-01 MED ORDER — SODIUM CHLORIDE (PF) 0.9 % IJ SOLN
INTRAMUSCULAR | Status: AC
  Filled 2022-04-01: qty 50

## 2022-04-04 ENCOUNTER — Other Ambulatory Visit: Payer: Self-pay

## 2022-04-04 ENCOUNTER — Other Ambulatory Visit: Payer: Self-pay | Admitting: Internal Medicine

## 2022-04-04 ENCOUNTER — Inpatient Hospital Stay: Payer: No Typology Code available for payment source

## 2022-04-04 ENCOUNTER — Other Ambulatory Visit: Payer: Self-pay | Admitting: *Deleted

## 2022-04-04 ENCOUNTER — Inpatient Hospital Stay: Payer: No Typology Code available for payment source | Attending: Internal Medicine

## 2022-04-04 ENCOUNTER — Inpatient Hospital Stay (HOSPITAL_BASED_OUTPATIENT_CLINIC_OR_DEPARTMENT_OTHER): Payer: No Typology Code available for payment source | Admitting: Physician Assistant

## 2022-04-04 VITALS — BP 147/119 | HR 80 | Temp 97.5°F | Resp 20 | Ht 71.0 in | Wt 223.0 lb

## 2022-04-04 DIAGNOSIS — C3481 Malignant neoplasm of overlapping sites of right bronchus and lung: Secondary | ICD-10-CM

## 2022-04-04 DIAGNOSIS — Z5111 Encounter for antineoplastic chemotherapy: Secondary | ICD-10-CM | POA: Insufficient documentation

## 2022-04-04 DIAGNOSIS — I159 Secondary hypertension, unspecified: Secondary | ICD-10-CM | POA: Diagnosis not present

## 2022-04-04 DIAGNOSIS — Z95828 Presence of other vascular implants and grafts: Secondary | ICD-10-CM

## 2022-04-04 DIAGNOSIS — C3411 Malignant neoplasm of upper lobe, right bronchus or lung: Secondary | ICD-10-CM | POA: Diagnosis present

## 2022-04-04 DIAGNOSIS — E0590 Thyrotoxicosis, unspecified without thyrotoxic crisis or storm: Secondary | ICD-10-CM | POA: Insufficient documentation

## 2022-04-04 DIAGNOSIS — Z452 Encounter for adjustment and management of vascular access device: Secondary | ICD-10-CM | POA: Diagnosis not present

## 2022-04-04 DIAGNOSIS — Z7189 Other specified counseling: Secondary | ICD-10-CM | POA: Diagnosis not present

## 2022-04-04 LAB — CBC WITH DIFFERENTIAL (CANCER CENTER ONLY)
Abs Immature Granulocytes: 0.02 10*3/uL (ref 0.00–0.07)
Basophils Absolute: 0 10*3/uL (ref 0.0–0.1)
Basophils Relative: 0 %
Eosinophils Absolute: 0.2 10*3/uL (ref 0.0–0.5)
Eosinophils Relative: 2 %
HCT: 42 % (ref 39.0–52.0)
Hemoglobin: 15.5 g/dL (ref 13.0–17.0)
Immature Granulocytes: 0 %
Lymphocytes Relative: 20 %
Lymphs Abs: 1.4 10*3/uL (ref 0.7–4.0)
MCH: 33.9 pg (ref 26.0–34.0)
MCHC: 36.9 g/dL — ABNORMAL HIGH (ref 30.0–36.0)
MCV: 91.9 fL (ref 80.0–100.0)
Monocytes Absolute: 0.5 10*3/uL (ref 0.1–1.0)
Monocytes Relative: 7 %
Neutro Abs: 5.1 10*3/uL (ref 1.7–7.7)
Neutrophils Relative %: 71 %
Platelet Count: 122 10*3/uL — ABNORMAL LOW (ref 150–400)
RBC: 4.57 MIL/uL (ref 4.22–5.81)
RDW: 12.4 % (ref 11.5–15.5)
WBC Count: 7.2 10*3/uL (ref 4.0–10.5)
nRBC: 0 % (ref 0.0–0.2)

## 2022-04-04 LAB — CMP (CANCER CENTER ONLY)
ALT: 20 U/L (ref 0–44)
AST: 25 U/L (ref 15–41)
Albumin: 4.7 g/dL (ref 3.5–5.0)
Alkaline Phosphatase: 51 U/L (ref 38–126)
Anion gap: 5 (ref 5–15)
BUN: 19 mg/dL (ref 8–23)
CO2: 29 mmol/L (ref 22–32)
Calcium: 10.5 mg/dL — ABNORMAL HIGH (ref 8.9–10.3)
Chloride: 101 mmol/L (ref 98–111)
Creatinine: 1.22 mg/dL (ref 0.61–1.24)
GFR, Estimated: >60 mL/min (ref >60)
Glucose, Bld: 165 mg/dL — ABNORMAL HIGH (ref 70–99)
Potassium: 3.6 mmol/L (ref 3.5–5.1)
Sodium: 135 mmol/L (ref 135–145)
Total Bilirubin: 1.1 mg/dL (ref 0.3–1.2)
Total Protein: 7.9 g/dL (ref 6.5–8.1)

## 2022-04-04 LAB — TSH: TSH: 36.976 u[IU]/mL — ABNORMAL HIGH (ref 0.350–4.500)

## 2022-04-04 MED ORDER — CLONIDINE HCL 0.1 MG PO TABS
0.2000 mg | ORAL_TABLET | Freq: Once | ORAL | Status: AC
  Administered 2022-04-04: 0.2 mg via ORAL
  Filled 2022-04-04: qty 2

## 2022-04-04 MED ORDER — HEPARIN SOD (PORK) LOCK FLUSH 100 UNIT/ML IV SOLN
500.0000 [IU] | Freq: Once | INTRAVENOUS | Status: AC
  Administered 2022-04-04: 500 [IU] via INTRAVENOUS

## 2022-04-04 MED ORDER — SODIUM CHLORIDE 0.9% FLUSH
10.0000 mL | Freq: Once | INTRAVENOUS | Status: AC
  Administered 2022-04-04: 10 mL

## 2022-04-04 MED ORDER — SODIUM CHLORIDE 0.9% FLUSH
10.0000 mL | INTRAVENOUS | Status: DC | PRN
  Administered 2022-04-04: 10 mL via INTRAVENOUS

## 2022-04-04 NOTE — Progress Notes (Signed)
DISCONTINUE ON PATHWAY REGIMEN - Small Cell Lung     Cycles 1 through 4: A cycle is every 21 days:     Durvalumab      Carboplatin      Etoposide    Cycles 5 and beyond: A cycle is every 28 days:     Durvalumab   **Always confirm dose/schedule in your pharmacy ordering system**  REASON: Disease Progression PRIOR TREATMENT: LOS420: Durvalumab 1,500 mg D1 + Carboplatin AUC=5 D1 + Etoposide 100 mg/m2 D1-3 q21 Days x 4 Cycles, Followed by Durvalumab 1,500 mg q28 Days Until Progression or Unacceptable Toxicity TREATMENT RESPONSE: Partial Response (PR)  START ON PATHWAY REGIMEN - Small Cell Lung     A cycle is every 21 days:     Lurbinectedin   **Always confirm dose/schedule in your pharmacy ordering system**  Patient Characteristics: Relapsed or Progressive Disease, Second Line, Relapse ? 6 Months Therapeutic Status: Relapsed or Progressive Disease Line of Therapy: Second Line Time to Relapse: Relapse ? 6 Months Intent of Therapy: Non-Curative / Palliative Intent, Discussed with Patient

## 2022-04-04 NOTE — Patient Instructions (Signed)
Your new treatment is given once every 3 weeks. This is just one chemotherapy medication. We will need to monitor your labs on a weekly basis.  -Please make sure to call the [facility] about blood pressure adjustment. Please monitor at home.

## 2022-04-04 NOTE — Progress Notes (Signed)
No Infusion received today.  Port de accessed.  Clonidine given due to high blood pressure.  Will recheck 30 minutes after administration of Clonidine.

## 2022-04-06 ENCOUNTER — Ambulatory Visit (INDEPENDENT_AMBULATORY_CARE_PROVIDER_SITE_OTHER): Payer: No Typology Code available for payment source | Admitting: Endocrinology

## 2022-04-06 ENCOUNTER — Encounter: Payer: Self-pay | Admitting: Endocrinology

## 2022-04-06 VITALS — BP 138/86 | HR 78 | Ht 71.0 in | Wt 222.8 lb

## 2022-04-06 DIAGNOSIS — E032 Hypothyroidism due to medicaments and other exogenous substances: Secondary | ICD-10-CM

## 2022-04-06 NOTE — Progress Notes (Signed)
Patient ID: Austin Wells, male   DOB: May 06, 1946, 76 y.o.   MRN: [id number]                                                                                                               Reason for Appointment: Follow-up of thyroid    Chief complaint: Tiredness   History of Present Illness:   Prior history: The patient's history is somewhat unclear but apparently was diagnosed to have hyperthyroidism when he was having routine labs done by his oncologist in 11/22 Does not appear to have had any symptoms of palpitations, sweating, unusual weight loss at that time He was having fatigue related to his cancer treatments  Although he has been treated with methimazole no baseline free T4 level is available TSH was suppressed between 11/22 and 2/23 Patient is not able to give a good history because of hearing loss and most of his history is obtained through his wife  Patient was given as much as 20 mg twice daily of methimazole on his first visit in 2/23 He did not feel different with starting this medication When he was seen in follow-up in 3/23 he was not having any new symptoms or unusual fatigue because of high TSH he was told to stop his methimazole for 10 days and then reduce the dose to 10 mg instead of 20  RECENT HISTORY:  For several weeks has been feeling more fatigued and has significant somnolence, difficulty remembering and overall mental functioning Because of his significant hypothyroidism as of 5/23 he was told to stop his methimazole and start LEVOTHYROXINE 75 mcg daily He does not think he is feeling any better and his wife still thinks that he is still somewhat somnolent and fatigued However his weight is coming down  On questioning it appears that he may not be taking his levothyroxine which his wife had picked up from the pharmacy but it is unclear whether he has taken this at all Although his TSH is improving it is still about 40  Wt Readings from Last 3 Encounters:   04/06/22 222 lb 12.8 oz (101.1 kg)  04/04/22 223 lb (101.2 kg)  03/14/22 228 lb (103.4 kg)    Thyroid function tests as follows:     Lab Results  Component Value Date   FREET4 0.5 (L) 01/20/2022   TSH 36.976 (H) 04/04/2022   TSH 80.470 (H) 03/07/2022   TSH 21.30 (H) 01/20/2022   Lab Results  Component Value Date   TSH 36.976 (H) 04/04/2022   TSH 80.470 (H) 03/07/2022   TSH 21.30 (H) 01/20/2022   TSH 4.240 (H) 01/10/2022   TSH <0.080 (L) 12/06/2021   TSH <0.080 (L) 11/22/2021   TSH <0.080 (L) 11/01/2021   TSH <0.080 (L) 10/11/2021   TSH <0.010 (L) 09/20/2021    No results found for: "THYROTRECAB"   Allergies as of 04/06/2022   No Known Allergies      Medication List        Accurate as of April 06, 2022 11:33  AM. If you have any questions, ask your nurse or doctor.          ARIPiprazole 5 MG tablet Commonly known as: ABILIFY Take 5 mg by mouth daily. For mood stabilization and depression   calcium-vitamin D 500-200 MG-UNIT tablet Commonly known as: OSCAL WITH D Take 1 tablet by mouth daily.   divalproex 500 MG DR tablet Commonly known as: DEPAKOTE Take by mouth.   folic acid 1 MG tablet Commonly known as: FOLVITE Take 1 tablet by mouth daily.   hydrochlorothiazide 25 MG tablet Commonly known as: HYDRODIURIL Take 12.5 mg by mouth daily.   levothyroxine 75 MCG tablet Commonly known as: SYNTHROID Take 1 tablet (75 mcg total) by mouth daily. On empty stomach   lidocaine-prilocaine cream Commonly known as: EMLA Apply to the Port-A-Cath site 30-60-minute before chemotherapy   lisinopril 40 MG tablet Commonly known as: ZESTRIL Take 1 tablet (40 mg total) by mouth daily. For hypertension. What changed: additional instructions   MELATONIN PO Take 1 tablet by mouth at bedtime.   methimazole 10 MG tablet Commonly known as: TAPAZOLE Take 1 tablet (10 mg total) by mouth daily.   prochlorperazine 10 MG tablet Commonly known as: COMPAZINE Take 1  tablet (10 mg total) by mouth every 6 (six) hours as needed for nausea or vomiting.   propranolol 10 MG tablet Commonly known as: INDERAL Take 10 mg by mouth 2 (two) times daily.   rosuvastatin 40 MG tablet Commonly known as: CRESTOR Take 40 mg by mouth daily.   sildenafil 100 MG tablet Commonly known as: VIAGRA TAKE ONE-HALF TABLET BY MOUTH AS INSTRUCTED (TAKE ONE HOUR PRIOR TO SEXUAL ACTIVITY) *DO NOT EXCEED ONE DOSE IN A 24 HOUR PERIOD*   Tiotropium Bromide-Olodaterol 2.5-2.5 MCG/ACT Aers INHALE 2 PUFFS BY MOUTH DAILY   traZODone 150 MG tablet Commonly known as: DESYREL Take 150 mg by mouth at bedtime as needed for sleep.   venlafaxine XR 75 MG 24 hr capsule Commonly known as: EFFEXOR-XR Take 75 mg by mouth daily with breakfast.   vitamin B-12 500 MCG tablet Commonly known as: CYANOCOBALAMIN Take 500 mcg by mouth daily. Vitamin B12            Past Medical History:  Diagnosis Date   Bipolar disorder ([place])    Depression    GERD (gastroesophageal reflux disease)    Hyperlipidemia    Hypertension    Orthostatic hypotension    Sleep apnea    has a cpap and doesn't use it   Syncope and collapse    Tobacco use     Past Surgical History:  Procedure Laterality Date   BRONCHIAL BIOPSY  08/30/2021   Procedure: BRONCHIAL BIOPSIES;  Surgeon: Garner Nash, DO;  Location: [facility];  Service: Pulmonary;;   BRONCHIAL BRUSHINGS  08/30/2021   Procedure: BRONCHIAL BRUSHINGS;  Surgeon: Garner Nash, DO;  Location: [facility];  Service: Pulmonary;;   BRONCHIAL NEEDLE ASPIRATION BIOPSY  08/30/2021   Procedure: BRONCHIAL NEEDLE ASPIRATION BIOPSIES;  Surgeon: Garner Nash, DO;  Location: [facility];  Service: Pulmonary;;   BRONCHIAL WASHINGS  08/30/2021   Procedure: BRONCHIAL WASHINGS;  Surgeon: Garner Nash, DO;  Location: [facility];  Service: Pulmonary;;   COLONOSCOPY     FINE NEEDLE ASPIRATION  08/30/2021   Procedure: FINE NEEDLE ASPIRATION (FNA) LINEAR;   Surgeon: Garner Nash, DO;  Location: [facility];  Service: Pulmonary;;   HERNIA REPAIR     inquinal   IR IMAGING GUIDED  PORT INSERTION  09/26/2021   VIDEO BRONCHOSCOPY WITH ENDOBRONCHIAL NAVIGATION Left 08/30/2021   Procedure: VIDEO BRONCHOSCOPY WITH ENDOBRONCHIAL NAVIGATION;  Surgeon: Garner Nash, DO;  Location: [facility];  Service: Pulmonary;  Laterality: Left;  ION w/ CIOS   VIDEO BRONCHOSCOPY WITH ENDOBRONCHIAL ULTRASOUND Left 08/30/2021   Procedure: VIDEO BRONCHOSCOPY WITH ENDOBRONCHIAL ULTRASOUND;  Surgeon: Garner Nash, DO;  Location: [facility];  Service: Pulmonary;  Laterality: Left;   VIDEO BRONCHOSCOPY WITH RADIAL ENDOBRONCHIAL ULTRASOUND  08/30/2021   Procedure: RADIAL ENDOBRONCHIAL ULTRASOUND;  Surgeon: Garner Nash, DO;  Location: [facility];  Service: Pulmonary;;    Family History  Problem Relation Age of Onset   Thyroid disease Neg Hx     Social History:  reports that he has been smoking cigarettes. He started smoking about 56 years ago. He has a 55.00 pack-year smoking history. He has never used smokeless tobacco. He reports current alcohol use. He reports current drug use. Drug: "Crack" cocaine.  Allergies: No Known Allergies   Review of Systems     Examination:   BP 138/86   Pulse 78   Ht 5\' 11"  (1.803 m)   Wt 222 lb 12.8 oz (101.1 kg)   SpO2 97%   BMI 31.07 kg/m    General Appearance: Somewhat disheveled looking     No significant facial puffiness or swelling of the eyes  Deep tendon reflexes at biceps are showing mild slowing of the relaxation.  No peripheral edema   Assessment/Plan:   Hyperthyroidism, resolved  He may have had suppressed TSH with unknown degree of hyperthyroidism in 11/22 from his immunotherapy As above baseline free T4 level before treatment was unknown   Currently he has hypothyroidism which may be autoimmune in nature  Since it is unclear whether he has taken his levothyroxine or not his wife will  check on his prescription bottle and let us know  He is still significantly hypothyroid with symptoms and will need to determine what dose of levothyroxine he will take from now on, may likely need this long-term  he will be seen back in follow-up in about 6 weeks   Elayne Snare 04/06/2022, 11:33 AM    Note: This office note was prepared with Dragon voice recognition system technology. Any transcriptional errors that result from this process are unintentional.

## 2022-04-07 ENCOUNTER — Other Ambulatory Visit: Payer: Self-pay | Admitting: Endocrinology

## 2022-04-07 DIAGNOSIS — E032 Hypothyroidism due to medicaments and other exogenous substances: Secondary | ICD-10-CM

## 2022-04-12 ENCOUNTER — Encounter: Payer: Self-pay | Admitting: Internal Medicine

## 2022-04-12 ENCOUNTER — Other Ambulatory Visit: Payer: Self-pay

## 2022-04-12 ENCOUNTER — Telehealth: Payer: Self-pay

## 2022-04-12 ENCOUNTER — Inpatient Hospital Stay: Payer: No Typology Code available for payment source

## 2022-04-12 VITALS — BP 143/108 | HR 77 | Temp 97.7°F | Resp 18 | Wt 222.8 lb

## 2022-04-12 DIAGNOSIS — Z5111 Encounter for antineoplastic chemotherapy: Secondary | ICD-10-CM | POA: Diagnosis not present

## 2022-04-12 DIAGNOSIS — C3481 Malignant neoplasm of overlapping sites of right bronchus and lung: Secondary | ICD-10-CM

## 2022-04-12 DIAGNOSIS — I10 Essential (primary) hypertension: Secondary | ICD-10-CM

## 2022-04-12 DIAGNOSIS — Z95828 Presence of other vascular implants and grafts: Secondary | ICD-10-CM

## 2022-04-12 LAB — CMP (CANCER CENTER ONLY)
ALT: 12 U/L (ref 0–44)
AST: 19 U/L (ref 15–41)
Albumin: 4.7 g/dL (ref 3.5–5.0)
Alkaline Phosphatase: 49 U/L (ref 38–126)
Anion gap: 5 (ref 5–15)
BUN: 15 mg/dL (ref 8–23)
CO2: 28 mmol/L (ref 22–32)
Calcium: 10 mg/dL (ref 8.9–10.3)
Chloride: 101 mmol/L (ref 98–111)
Creatinine: 1.16 mg/dL (ref 0.61–1.24)
GFR, Estimated: >60 mL/min
Glucose, Bld: 110 mg/dL — ABNORMAL HIGH (ref 70–99)
Potassium: 3.9 mmol/L (ref 3.5–5.1)
Sodium: 134 mmol/L — ABNORMAL LOW (ref 135–145)
Total Bilirubin: 0.9 mg/dL (ref 0.3–1.2)
Total Protein: 7.9 g/dL (ref 6.5–8.1)

## 2022-04-12 LAB — CBC WITH DIFFERENTIAL (CANCER CENTER ONLY)
Abs Immature Granulocytes: 0.02 K/uL (ref 0.00–0.07)
Basophils Absolute: 0 K/uL (ref 0.0–0.1)
Basophils Relative: 0 %
Eosinophils Absolute: 0.2 K/uL (ref 0.0–0.5)
Eosinophils Relative: 3 %
HCT: 40 % (ref 39.0–52.0)
Hemoglobin: 14.8 g/dL (ref 13.0–17.0)
Immature Granulocytes: 0 %
Lymphocytes Relative: 26 %
Lymphs Abs: 1.6 K/uL (ref 0.7–4.0)
MCH: 33.9 pg (ref 26.0–34.0)
MCHC: 37 g/dL — ABNORMAL HIGH (ref 30.0–36.0)
MCV: 91.7 fL (ref 80.0–100.0)
Monocytes Absolute: 0.5 K/uL (ref 0.1–1.0)
Monocytes Relative: 8 %
Neutro Abs: 3.8 K/uL (ref 1.7–7.7)
Neutrophils Relative %: 63 %
Platelet Count: 121 K/uL — ABNORMAL LOW (ref 150–400)
RBC: 4.36 MIL/uL (ref 4.22–5.81)
RDW: 12.2 % (ref 11.5–15.5)
WBC Count: 6.1 K/uL (ref 4.0–10.5)
nRBC: 0 % (ref 0.0–0.2)

## 2022-04-12 MED ORDER — PALONOSETRON HCL INJECTION 0.25 MG/5ML
0.2500 mg | Freq: Once | INTRAVENOUS | Status: AC
  Administered 2022-04-12: 0.25 mg via INTRAVENOUS
  Filled 2022-04-12: qty 5

## 2022-04-12 MED ORDER — SODIUM CHLORIDE 0.9% FLUSH
10.0000 mL | INTRAVENOUS | Status: DC | PRN
  Administered 2022-04-12: 10 mL

## 2022-04-12 MED ORDER — SODIUM CHLORIDE 0.9 % IV SOLN: Freq: Once | INTRAVENOUS | Status: AC

## 2022-04-12 MED ORDER — CLONIDINE HCL 0.1 MG PO TABS
0.2000 mg | ORAL_TABLET | Freq: Once | ORAL | Status: AC
  Administered 2022-04-12: 0.2 mg via ORAL
  Filled 2022-04-12: qty 2

## 2022-04-12 MED ORDER — SODIUM CHLORIDE 0.9% FLUSH
10.0000 mL | Freq: Once | INTRAVENOUS | Status: AC
  Administered 2022-04-12: 10 mL

## 2022-04-12 MED ORDER — HEPARIN SOD (PORK) LOCK FLUSH 100 UNIT/ML IV SOLN
500.0000 [IU] | Freq: Once | INTRAVENOUS | Status: AC | PRN
  Administered 2022-04-12: 500 [IU]

## 2022-04-12 MED ORDER — SODIUM CHLORIDE 0.9 % IV SOLN
3.2000 mg/m2 | Freq: Once | INTRAVENOUS | Status: AC
  Administered 2022-04-12: 7.2 mg via INTRAVENOUS
  Filled 2022-04-12: qty 14.4

## 2022-04-12 MED ORDER — SODIUM CHLORIDE 0.9 % IV SOLN
10.0000 mg | Freq: Once | INTRAVENOUS | Status: AC
  Administered 2022-04-12: 10 mg via INTRAVENOUS
  Filled 2022-04-12: qty 10

## 2022-04-12 NOTE — Telephone Encounter (Signed)
Reached out to wife. She says that she doesn't believe pt is taking medications as prescribed. I also called the [facility] and they said that they have attempted to contact pt. Wife has recently purchased a pill container and will monitor his meds. She will also reach out to PCP- Dr. Vonna Drafts at the [facility] to discuss increased blood pressure issues. Explained that elevated blood pressure can cause a stroke if left untreated.

## 2022-04-12 NOTE — Addendum Note (Signed)
Addended by: Marlowe Aschoff B on: 04/12/2022 01:32 PM   Modules accepted: Orders

## 2022-04-12 NOTE — Patient Instructions (Addendum)
[facility] ONCOLOG[facility]   Discharge Instructions: Thank you for choosing [facility] to provide your oncology and hematology care.   If you have a lab appointment with the [facility], please go directly to the [facility] and check in at the registration area.   Wear comfortable clothing and clothing appropriate for easy access to any Portacath or PICC line.   We strive to give you quality time with your provider. You may need to reschedule your appointment if you arrive late (15 or more minutes).  Arriving late affects you and other patients whose appointments are after yours.  Also, if you miss three or more appointments without notifying the office, you may be dismissed from the clinic at the provider's discretion.      For prescription refill requests, have your pharmacy contact our office and allow 72 hours for refills to be completed.    Today you received the following chemotherapy and/or immunotherapy agents: Lurbinectedin (Zepzelca)      To help prevent nausea and vomiting after your treatment, we encourage you to take your nausea medication as directed.  BELOW ARE SYMPTOMS THAT SHOULD BE REPORTED IMMEDIATELY: *FEVER GREATER THAN 100.4 F (38 C) OR HIGHER *CHILLS OR SWEATING *NAUSEA AND VOMITING THAT IS NOT CONTROLLED WITH YOUR NAUSEA MEDICATION *UNUSUAL SHORTNESS OF BREATH *UNUSUAL BRUISING OR BLEEDING *URINARY PROBLEMS (pain or burning when urinating, or frequent urination) *BOWEL PROBLEMS (unusual diarrhea, constipation, pain near the anus) TENDERNESS IN MOUTH AND THROAT WITH OR WITHOUT PRESENCE OF ULCERS (sore throat, sores in mouth, or a toothache) UNUSUAL RASH, SWELLING OR PAIN  UNUSUAL VAGINAL DISCHARGE OR ITCHING   Items with * indicate a potential emergency and should be followed up as soon as possible or go to the Emergency Department if any problems should occur.  Please show the CHEMOTHERAPY ALERT CARD or IMMUNOTHERAPY ALERT  CARD at check-in to the Emergency Department and triage nurse.  Should you have questions after your visit or need to cancel or reschedule your appointment, please contact [facility]  Dept: [phone]  and follow the prompts.  Office hours are 8:00 a.m. to 4:30 p.m. Monday - Friday. Please note that voicemails left after 4:00 p.m. may not be returned until the following business day.  We are closed weekends and major holidays. You have access to a nurse at all times for urgent questions. Please call the main number to the clinic Dept: [phone] and follow the prompts.   For any non-urgent questions, you may also contact your provider using MyChart. We now offer e-Visits for anyone 49 and older to request care online for non-urgent symptoms. For details visit [email or web address].   Also download the MyChart app! Go to the app store, search "MyChart", open the app, select [facility], and log in with your MyChart username and password.  Due to Covid, a mask is required upon entering the hospital/clinic. If you do not have a mask, one will be given to you upon arrival. For doctor visits, patients may have 1 support person aged 42 or older with them. For treatment visits, patients cannot have anyone with them due to current Covid guidelines and our immunocompromised population.   Lurbinectedin Injection What is this medication? LURBINECTEDIN (LOOR bin EK te din) treats lung cancer. It works by slowing down the growth of cancer cells. This medicine may be used for other purposes; ask your health care provider or pharmacist if you have questions. COMMON BRAND NAME(S):  ZEPZELCA What should I tell my care team before I take this medication? They need to know if you have any of these conditions: Liver disease Low blood counts--white cells, platelets, red blood cells An unusual or allergic reaction to lurbinectedin, other medicines, foods, dyes or  preservatives Pregnant or trying to get pregnant Beast-feeding How should I use this medication? This medication is injected into a vein. It is given in a hospital or clinic setting. Talk to your care team about the use of this medicine in children. Special care may be needed. Overdosage: If you think you have taken too much of this medicine contact a poison control center or emergency room at once. NOTE: This medicine is only for you. Do not share this medicine with others. What if I miss a dose? Keep appointments for follow-up doses. It is important not to miss your dose. Call your care team if you are unable to keep an appointment. What may interact with this medication? Certain antibiotics like erythromycin or clarithromycin Certain antivirals for HIV or hepatitis Certain medicines for fungal infections like ketoconazole, itraconazole, posaconazole Certain medicines for seizures like carbamazepine, phenobarbital, phenytoin Grapefruit or grapefruit juice St. John's Wort This list may not describe all possible interactions. Give your health care provider a list of all the medicines, herbs, non-prescription drugs, or dietary supplements you use. Also tell them if you smoke, drink alcohol, or use illegal drugs. Some items may interact with your medicine. What should I watch for while using this medication? This medication may make you feel generally unwell. This is not uncommon as chemotherapy can affect healthy cells as well as cancer cells. Report any side effects. Continue your course of treatment even though you feel ill unless your care team tells you to stop. This medication may increase your risk of getting an infection. Call your care team for advice if you get a fever, chills, sore throat, or other symptoms of a cold or flu. Do not treat yourself. Try to avoid being around people who are sick. Avoid taking medications that contain aspirin, acetaminophen, ibuprofen, naproxen, or  ketoprofen unless instructed by your care team. These medications may hide a fever. Be careful brushing or flossing your teeth or using a toothpick because you may get an infection or bleed more easily. If you have any dental work done, tell your dentist you are receiving this medication. Do not become pregnant while taking this medication or for 6 months after stopping it. Women should inform their care team if they wish to become pregnant or think they might be pregnant. Men should not father a child while taking this medication and for 4 months after stopping it. There is potential for serious side effects to an unborn child. Talk to your care team for more information. Do not breast-feed a child while taking this medication or for 2 weeks after stopping it. What side effects may I notice from receiving this medication? Side effects that you should report to your care team as soon as possible: Allergic reactions--skin rash, itching, hives, swelling of the face, lips, tongue, or throat Infection--fever, chills, cough, or sore throat Kidney injury--decrease in the amount of urine, swelling of the ankles, hands, or feet Leakage of medicine out of your vein during the infusion Liver injury--right upper belly pain, loss of appetite, nausea, light-colored stool, dark yellow or brown urine, yellowing skin or eyes, unusual weakness or fatigue Low red blood cell count--unusual weakness or fatigue, dizziness, headache, trouble breathing Muscle injury--unusual weakness  or fatigue, muscle pain, dark yellow or brown urine, decrease in amount of urine Painful swelling, warmth, or redness of the skin, blisters, or sores Unusual bruising or bleeding Vomiting Side effects that usually do not require medical attention (report these to your care team if they continue or are bothersome): Constipation Cough Diarrhea High blood sugar (hyperglycemia)--increased thirst or amount of urine, unusual weakness or fatigue,  blurry vision Loss of appetite Low magnesium level--muscle pain or cramps, unusual weakness or fatigue, fast or irregular heartbeat, tremors Low sodium level--muscle weakness, fatigue, dizziness, headache, confusion Nausea Trouble breathing This list may not describe all possible side effects. Call your doctor for medical advice about side effects. You may report side effects to FDA at 1-800-FDA-1088. Where should I keep my medication? This medicine is given in a hospital or clinic and will not be stored at home. NOTE: This sheet is a summary. It may not cover all possible information. If you have questions about this medicine, talk to your doctor, pharmacist, or health care provider.  2023 Elsevier/Gold Standard (2021-02-02 00:00:00)

## 2022-04-12 NOTE — Progress Notes (Signed)
Received signed grant paperwork for one-time [employer].  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 Copy of approval letter and expense sheet were given to spouse. She called me to discuss grant details and how they are covered. Answered questions she had.  She has my card for any additional financial questions or concerns.

## 2022-04-12 NOTE — Telephone Encounter (Signed)
Opened erroneously

## 2022-04-12 NOTE — Telephone Encounter (Signed)
OK to treat with B/P of 143/108 per Dr. Julien Nordmann.

## 2022-04-13 ENCOUNTER — Telehealth: Payer: Self-pay | Admitting: Medical Oncology

## 2022-04-13 ENCOUNTER — Telehealth: Payer: Self-pay

## 2022-04-13 NOTE — Telephone Encounter (Signed)
Patient's wife Quita Skye states that Austin Wells is doing well. He is currently sleeping. He is eating, drinking, and urinating well. They know to call the office at [phone] if they have any questions or concerns.

## 2022-04-13 NOTE — Telephone Encounter (Signed)
-----   Message from Anastasia Pall, RN sent at 04/12/2022  3:26 PM EDT ----- Regarding: 1st time Zepzelca, being seen by Dr. Julien Nordmann. Hello, Austin Wells received his 1st Zepzelca infusion today. He tolerated the treatment well. Please follow up with him tomorrow. Thank you.

## 2022-04-13 NOTE — Telephone Encounter (Signed)
VS flow sheet faxed to Dr Vonna Drafts.  Monique at [place] stated [place] nurse spoke to pts wife and pt has appt Monday with his [facility] PCP.

## 2022-04-13 NOTE — Telephone Encounter (Signed)
Faxed recent BP flow sheet and office notes to [place] Dr Vonna Drafts.

## 2022-04-14 ENCOUNTER — Inpatient Hospital Stay: Payer: No Typology Code available for payment source | Admitting: Licensed Clinical Social Worker

## 2022-04-14 ENCOUNTER — Other Ambulatory Visit: Payer: Self-pay

## 2022-04-14 DIAGNOSIS — C3481 Malignant neoplasm of overlapping sites of right bronchus and lung: Secondary | ICD-10-CM

## 2022-04-17 ENCOUNTER — Encounter: Payer: Self-pay | Admitting: *Deleted

## 2022-04-18 ENCOUNTER — Inpatient Hospital Stay: Payer: No Typology Code available for payment source

## 2022-04-24 ENCOUNTER — Encounter: Payer: Self-pay | Admitting: Licensed Clinical Social Worker

## 2022-04-24 DIAGNOSIS — C3481 Malignant neoplasm of overlapping sites of right bronchus and lung: Secondary | ICD-10-CM
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
## 2022-05-02 ENCOUNTER — Other Ambulatory Visit: Payer: No Typology Code available for payment source

## 2022-05-02 ENCOUNTER — Ambulatory Visit: Payer: No Typology Code available for payment source

## 2022-05-02 ENCOUNTER — Ambulatory Visit: Payer: No Typology Code available for payment source | Admitting: Internal Medicine

## 2022-05-03 MED FILL — Dexamethasone Sodium Phosphate Inj 100 MG/10ML: INTRAMUSCULAR | Qty: 1 | Status: AC

## 2022-05-04 ENCOUNTER — Inpatient Hospital Stay (HOSPITAL_BASED_OUTPATIENT_CLINIC_OR_DEPARTMENT_OTHER): Payer: No Typology Code available for payment source | Admitting: Internal Medicine

## 2022-05-04 ENCOUNTER — Other Ambulatory Visit: Payer: Self-pay

## 2022-05-04 ENCOUNTER — Inpatient Hospital Stay: Payer: No Typology Code available for payment source

## 2022-05-04 ENCOUNTER — Inpatient Hospital Stay: Payer: No Typology Code available for payment source | Attending: Internal Medicine

## 2022-05-04 VITALS — BP 140/118 | HR 92 | Temp 98.7°F | Resp 19 | Wt 213.4 lb

## 2022-05-04 VITALS — BP 148/110 | HR 93 | Resp 20

## 2022-05-04 DIAGNOSIS — Z5111 Encounter for antineoplastic chemotherapy: Secondary | ICD-10-CM | POA: Diagnosis present

## 2022-05-04 DIAGNOSIS — C3411 Malignant neoplasm of upper lobe, right bronchus or lung: Secondary | ICD-10-CM | POA: Insufficient documentation

## 2022-05-04 DIAGNOSIS — D649 Anemia, unspecified: Secondary | ICD-10-CM | POA: Diagnosis not present

## 2022-05-04 DIAGNOSIS — C3481 Malignant neoplasm of overlapping sites of right bronchus and lung: Secondary | ICD-10-CM | POA: Diagnosis not present

## 2022-05-04 DIAGNOSIS — Z95828 Presence of other vascular implants and grafts: Secondary | ICD-10-CM

## 2022-05-04 LAB — CBC WITH DIFFERENTIAL (CANCER CENTER ONLY)
Abs Immature Granulocytes: 0.03 10*3/uL (ref 0.00–0.07)
Basophils Absolute: 0 10*3/uL (ref 0.0–0.1)
Basophils Relative: 0 %
Eosinophils Absolute: 0 10*3/uL (ref 0.0–0.5)
Eosinophils Relative: 0 %
HCT: 32.3 % — ABNORMAL LOW (ref 39.0–52.0)
Hemoglobin: 12.1 g/dL — ABNORMAL LOW (ref 13.0–17.0)
Immature Granulocytes: 1 %
Lymphocytes Relative: 30 %
Lymphs Abs: 1.5 10*3/uL (ref 0.7–4.0)
MCH: 33.8 pg (ref 26.0–34.0)
MCHC: 37.5 g/dL — ABNORMAL HIGH (ref 30.0–36.0)
MCV: 90.2 fL (ref 80.0–100.0)
Monocytes Absolute: 0.7 10*3/uL (ref 0.1–1.0)
Monocytes Relative: 15 %
Neutro Abs: 2.6 10*3/uL (ref 1.7–7.7)
Neutrophils Relative %: 54 %
Platelet Count: 171 10*3/uL (ref 150–400)
RBC: 3.58 MIL/uL — ABNORMAL LOW (ref 4.22–5.81)
RDW: 12.4 % (ref 11.5–15.5)
WBC Count: 4.8 10*3/uL (ref 4.0–10.5)
nRBC: 0 % (ref 0.0–0.2)

## 2022-05-04 LAB — CMP (CANCER CENTER ONLY)
ALT: 11 U/L (ref 0–44)
AST: 16 U/L (ref 15–41)
Albumin: 4.4 g/dL (ref 3.5–5.0)
Alkaline Phosphatase: 60 U/L (ref 38–126)
Anion gap: 5 (ref 5–15)
BUN: 13 mg/dL (ref 8–23)
CO2: 27 mmol/L (ref 22–32)
Calcium: 9.9 mg/dL (ref 8.9–10.3)
Chloride: 102 mmol/L (ref 98–111)
Creatinine: 1.03 mg/dL (ref 0.61–1.24)
GFR, Estimated: >60 mL/min (ref >60)
Glucose, Bld: 100 mg/dL — ABNORMAL HIGH (ref 70–99)
Potassium: 3.9 mmol/L (ref 3.5–5.1)
Sodium: 134 mmol/L — ABNORMAL LOW (ref 135–145)
Total Bilirubin: 0.7 mg/dL (ref 0.3–1.2)
Total Protein: 7.6 g/dL (ref 6.5–8.1)

## 2022-05-04 MED ORDER — SODIUM CHLORIDE 0.9 % IV SOLN
10.0000 mg | Freq: Once | INTRAVENOUS | Status: AC
  Administered 2022-05-04: 10 mg via INTRAVENOUS
  Filled 2022-05-04: qty 10

## 2022-05-04 MED ORDER — SODIUM CHLORIDE 0.9% FLUSH
10.0000 mL | Freq: Once | INTRAVENOUS | Status: AC
  Administered 2022-05-04: 10 mL

## 2022-05-04 MED ORDER — SODIUM CHLORIDE 0.9 % IV SOLN: Freq: Once | INTRAVENOUS | Status: AC

## 2022-05-04 MED ORDER — SODIUM CHLORIDE 0.9% FLUSH
10.0000 mL | INTRAVENOUS | Status: DC | PRN
  Administered 2022-05-04: 10 mL

## 2022-05-04 MED ORDER — PALONOSETRON HCL INJECTION 0.25 MG/5ML
0.2500 mg | Freq: Once | INTRAVENOUS | Status: AC
  Administered 2022-05-04: 0.25 mg via INTRAVENOUS
  Filled 2022-05-04: qty 5

## 2022-05-04 MED ORDER — HEPARIN SOD (PORK) LOCK FLUSH 100 UNIT/ML IV SOLN
500.0000 [IU] | Freq: Once | INTRAVENOUS | Status: AC | PRN
  Administered 2022-05-04: 500 [IU]

## 2022-05-04 MED ORDER — SODIUM CHLORIDE 0.9 % IV SOLN
3.2000 mg/m2 | Freq: Once | INTRAVENOUS | Status: AC
  Administered 2022-05-04: 7.2 mg via INTRAVENOUS
  Filled 2022-05-04: qty 14.4

## 2022-05-04 NOTE — Progress Notes (Signed)
Ok to treat with bp per Dr Julien Nordmann

## 2022-05-04 NOTE — Progress Notes (Signed)
Austin Wells [phone]   Fax:([phone] [place] [address]  DIAGNOSIS: Extensive stage (T1b, N3, M1 a) small cell lung cancer presented with right upper lobe nodule in addition to multiple left lung nodule as well as bilateral hilar and mediastinal lymphadenopathy diagnosed in November 2022.  PRIOR THERAPY: Systemic chemotherapy with carboplatin for AUC of 5 on day 1 and etoposide 100 Mg/M2 on days 1, 2 and 3 with Cosela for Myeloprotection as well as Imfinzi 1500 Mg IV every 3 weeks.  Started September 20, 2021.  Status post 8 cycles.  Starting from cycle #5 the patient is on maintenance treatment with Imfinzi 1500 Mg IV every 4 weeks.  CURRENT THERAPY: Second line systemic chemotherapy with Zepzelca (lurbinectedin) 3.2 Mg/M2 every 3 weeks.  First dose 04/11/2022.  Status post 1 cycle  INTERVAL HISTORY: Austin Wells 76 y.o. male returns to the clinic today for follow-up visit accompanied by his wife.  The patient is feeling fine today with no concerning complaints except for mild fatigue.  He tolerated the first cycle of his treatment with Zepzelca (lurbinectedin) fairly well.  He denied having any chest pain, shortness of breath, cough or hemoptysis.  He denied having any fever or chills.  He has no nausea, vomiting, diarrhea or constipation.  He has no headache or visual changes.  He is here today for evaluation before starting cycle #2.   MEDICAL HISTORY: Past Medical History:  Diagnosis Date   Bipolar disorder ([place])    Depression    GERD (gastroesophageal reflux disease)    Hyperlipidemia    Hypertension    Orthostatic hypotension    Sleep apnea    has a cpap and doesn't use it   Syncope and collapse    Tobacco use     ALLERGIES:  has No Known Allergies.  MEDICATIONS:  Current Outpatient Medications  Medication Sig Dispense Refill   ARIPiprazole (ABILIFY)  5 MG tablet Take 5 mg by mouth daily. For mood stabilization and depression     calcium-vitamin D (OSCAL WITH D) 500-200 MG-UNIT tablet Take 1 tablet by mouth daily.     cyanocobalamin 500 MCG tablet Take 500 mcg by mouth daily. Vitamin B12     divalproex (DEPAKOTE) 500 MG DR tablet Take by mouth.     folic acid (FOLVITE) 1 MG tablet Take 1 tablet by mouth daily.     hydrochlorothiazide (HYDRODIURIL) 25 MG tablet Take 12.5 mg by mouth daily.     levothyroxine (SYNTHROID) 75 MCG tablet Take 1 tablet by mouth daily(morning) on empty stomach 30 tablet 2   lidocaine-prilocaine (EMLA) cream Apply to the Port-A-Cath site 30-60-minute before chemotherapy 30 g 0   lisinopril (PRINIVIL,ZESTRIL) 40 MG tablet Take 1 tablet (40 mg total) by mouth daily. For hypertension. (Patient taking differently: Take 40 mg by mouth daily. For blood pressure)     MELATONIN PO Take 1 tablet by mouth at bedtime.     prochlorperazine (COMPAZINE) 10 MG tablet Take 1 tablet (10 mg total) by mouth every 6 (six) hours as needed for nausea or vomiting. (Patient not taking: Reported on 04/06/2022) 30 tablet 0   propranolol (INDERAL) 10 MG tablet Take 10 mg by mouth 2 (two) times daily.     rosuvastatin (CRESTOR) 40 MG tablet Take 40 mg by mouth daily.     sildenafil (VIAGRA) 100 MG tablet TAKE ONE-HALF TABLET BY MOUTH AS INSTRUCTED (  TAKE ONE HOUR PRIOR TO SEXUAL ACTIVITY) *DO NOT EXCEED ONE DOSE IN A 24 HOUR PERIOD*     Tiotropium Bromide-Olodaterol 2.5-2.5 MCG/ACT AERS INHALE 2 PUFFS BY MOUTH DAILY     traZODone (DESYREL) 150 MG tablet Take 150 mg by mouth at bedtime as needed for sleep.     venlafaxine XR (EFFEXOR-XR) 75 MG 24 hr capsule Take 75 mg by mouth daily with breakfast.     No current facility-administered medications for this visit.    SURGICAL HISTORY:  Past Surgical History:  Procedure Laterality Date   BRONCHIAL BIOPSY  08/30/2021   Procedure: BRONCHIAL BIOPSIES;  Surgeon: Garner Nash, DO;  Location: [facility];  Service: Pulmonary;;   BRONCHIAL BRUSHINGS  08/30/2021   Procedure: BRONCHIAL BRUSHINGS;  Surgeon: Garner Nash, DO;  Location: [facility];  Service: Pulmonary;;   BRONCHIAL NEEDLE ASPIRATION BIOPSY  08/30/2021   Procedure: BRONCHIAL NEEDLE ASPIRATION BIOPSIES;  Surgeon: Garner Nash, DO;  Location: [facility];  Service: Pulmonary;;   BRONCHIAL WASHINGS  08/30/2021   Procedure: BRONCHIAL WASHINGS;  Surgeon: Garner Nash, DO;  Location: [facility];  Service: Pulmonary;;   COLONOSCOPY     FINE NEEDLE ASPIRATION  08/30/2021   Procedure: FINE NEEDLE ASPIRATION (FNA) LINEAR;  Surgeon: Garner Nash, DO;  Location: [facility];  Service: Pulmonary;;   HERNIA REPAIR     inquinal   IR IMAGING GUIDED PORT INSERTION  09/26/2021   VIDEO BRONCHOSCOPY WITH ENDOBRONCHIAL NAVIGATION Left 08/30/2021   Procedure: VIDEO BRONCHOSCOPY WITH ENDOBRONCHIAL NAVIGATION;  Surgeon: Garner Nash, DO;  Location: [facility];  Service: Pulmonary;  Laterality: Left;  ION w/ CIOS   VIDEO BRONCHOSCOPY WITH ENDOBRONCHIAL ULTRASOUND Left 08/30/2021   Procedure: VIDEO BRONCHOSCOPY WITH ENDOBRONCHIAL ULTRASOUND;  Surgeon: Garner Nash, DO;  Location: [facility];  Service: Pulmonary;  Laterality: Left;   VIDEO BRONCHOSCOPY WITH RADIAL ENDOBRONCHIAL ULTRASOUND  08/30/2021   Procedure: RADIAL ENDOBRONCHIAL ULTRASOUND;  Surgeon: Garner Nash, DO;  Location: [facility];  Service: Pulmonary;;    REVIEW OF SYSTEMS:  A comprehensive review of systems was negative except for: Constitutional: positive for fatigue   PHYSICAL EXAMINATION: General appearance: alert, cooperative, fatigued, and no distress Head: Normocephalic, without obvious abnormality, atraumatic Neck: no adenopathy, no JVD, supple, symmetrical, trachea midline, and thyroid not enlarged, symmetric, no tenderness/mass/nodules Lymph nodes: Cervical, supraclavicular, and axillary nodes normal. Resp: clear to auscultation  bilaterally Back: symmetric, no curvature. ROM normal. No CVA tenderness. Cardio: regular rate and rhythm, S1, S2 normal, no murmur, click, rub or gallop GI: soft, non-tender; bowel sounds normal; no masses,  no organomegaly Extremities: extremities normal, atraumatic, no cyanosis or edema  ECOG PERFORMANCE STATUS: 1 - Symptomatic but completely ambulatory  Blood pressure (!) 140/118, pulse 92, temperature 98.7 F (37.1 C), temperature source Oral, resp. rate 19, weight 213 lb 6 oz (96.8 kg), SpO2 100 %.  LABORATORY DATA: Lab Results  Component Value Date   WBC 4.8 05/04/2022   HGB 12.1 (L) 05/04/2022   HCT 32.3 (L) 05/04/2022   MCV 90.2 05/04/2022   PLT 171 05/04/2022      Chemistry      Component Value Date/Time   NA 134 (L) 04/12/2022 1124   K 3.9 04/12/2022 1124   CL 101 04/12/2022 1124   CO2 28 04/12/2022 1124   BUN 15 04/12/2022 1124   CREATININE 1.16 04/12/2022 1124      Component Value Date/Time   CALCIUM 10.0 04/12/2022 1124   ALKPHOS 49  04/12/2022 1124   AST 19 04/12/2022 1124   ALT 12 04/12/2022 1124   BILITOT 0.9 04/12/2022 1124       RADIOGRAPHIC STUDIES: No results found.  ASSESSMENT AND PLAN: This is a very pleasant 76 years old white male diagnosed with extensive stage (T1b, N3, M1 a) small cell lung cancer presented with right upper lobe lung nodule in addition to multiple left lung nodules and bilateral and mediastinal lymphadenopathy diagnosed in November 2022. The patient underwent systemic chemotherapy with carboplatin for AUC of 5 on day 1, etoposide 100 Mg/M2 on days 1, 2 and 3 with Cosela 240 Mg/M2 for Myeloprotection before chemotherapy as well as Imfinzi 1500 Mg IV every 3 weeks with chemo.  He is status post 8 cycle of treatment.  Starting from cycle #5 the patient is on treatment with single agent Imfinzi 1500 Mg IV every 4 weeks.  This treatment was discontinued secondary to disease progression. The patient is started second line systemic  chemotherapy with Zepzelca (lurbinectedin) 3.2 Mg/M2 every 3 weeks.  Status post 1 cycle.  He tolerated the first cycle of his treatment well except for fatigue. CBC today is unremarkable except for mild anemia.  I recommended for the patient to proceed with cycle #2 today as planned. I will see him back for follow-up visit in 3 weeks for evaluation before the next cycle of his treatment. The patient was advised to call immediately if he has any other concerning symptoms in the interval.  All questions were answered. The patient knows to call the clinic with any problems, questions or concerns. We can certainly see the patient much sooner if necessary.  Disclaimer: This note was dictated with voice recognition software. Similar sounding words can inadvertently be transcribed and may not be corrected upon review.

## 2022-05-04 NOTE — Patient Instructions (Signed)
[facility]  Discharge Instructions: Thank you for choosing [facility] to provide your oncology and hematology care.   If you have a lab appointment with the [facility], please go directly to the [facility] and check in at the registration area.   Wear comfortable clothing and clothing appropriate for easy access to any Portacath or PICC line.   We strive to give you quality time with your provider. You may need to reschedule your appointment if you arrive late (15 or more minutes).  Arriving late affects you and other patients whose appointments are after yours.  Also, if you miss three or more appointments without notifying the office, you may be dismissed from the clinic at the provider's discretion.      For prescription refill requests, have your pharmacy contact our office and allow 72 hours for refills to be completed.    Today you received the following chemotherapy and/or immunotherapy agents: Zepzelca       To help prevent nausea and vomiting after your treatment, we encourage you to take your nausea medication as directed.  BELOW ARE SYMPTOMS THAT SHOULD BE REPORTED IMMEDIATELY: *FEVER GREATER THAN 100.4 F (38 C) OR HIGHER *CHILLS OR SWEATING *NAUSEA AND VOMITING THAT IS NOT CONTROLLED WITH YOUR NAUSEA MEDICATION *UNUSUAL SHORTNESS OF BREATH *UNUSUAL BRUISING OR BLEEDING *URINARY PROBLEMS (pain or burning when urinating, or frequent urination) *BOWEL PROBLEMS (unusual diarrhea, constipation, pain near the anus) TENDERNESS IN MOUTH AND THROAT WITH OR WITHOUT PRESENCE OF ULCERS (sore throat, sores in mouth, or a toothache) UNUSUAL RASH, SWELLING OR PAIN  UNUSUAL VAGINAL DISCHARGE OR ITCHING   Items with * indicate a potential emergency and should be followed up as soon as possible or go to the Emergency Department if any problems should occur.  Please show the CHEMOTHERAPY ALERT CARD or IMMUNOTHERAPY ALERT CARD at check-in to  the Emergency Department and triage nurse.  Should you have questions after your visit or need to cancel or reschedule your appointment, please contact [facility]  Dept: [phone]  and follow the prompts.  Office hours are 8:00 a.m. to 4:30 p.m. Monday - Friday. Please note that voicemails left after 4:00 p.m. may not be returned until the following business day.  We are closed weekends and major holidays. You have access to a nurse at all times for urgent questions. Please call the main number to the clinic Dept: [phone] and follow the prompts.   For any non-urgent questions, you may also contact your provider using MyChart. We now offer e-Visits for anyone 12 and older to request care online for non-urgent symptoms. For details visit [email or web address].   Also download the MyChart app! Go to the app store, search "MyChart", open the app, select [facility], and log in with your MyChart username and password.  Masks are optional in the cancer centers. If you would like for your care team to wear a mask while they are taking care of you, please let them know. For doctor visits, patients may have with them one support person who is at least 74 years old. At this time, visitors are not allowed in the infusion area.

## 2022-05-09 ENCOUNTER — Inpatient Hospital Stay: Payer: No Typology Code available for payment source

## 2022-05-15 ENCOUNTER — Other Ambulatory Visit: Payer: Self-pay

## 2022-05-16 ENCOUNTER — Inpatient Hospital Stay: Payer: No Typology Code available for payment source

## 2022-05-18 NOTE — Progress Notes (Signed)
[place] [address]  DIAGNOSIS: Extensive stage (T1b, N3, M1 a) small cell lung cancer presented with right upper lobe nodule in addition to multiple left lung nodule as well as bilateral hilar and mediastinal lymphadenopathy diagnosed in November 2022.  PRIOR THERAPY: Systemic chemotherapy with carboplatin for AUC of 5 on day 1 and etoposide 100 Mg/M2 on days 1, 2 and 3 with Cosela for Myeloprotection as well as Imfinzi 1500 Mg IV every 3 weeks.  Started September 20, 2021.  Status post 8 cycles.  Starting from cycle #5, the patient started maintenance immunotherapy with Imfinzi. Last dose on 03/07/22  CURRENT THERAPY: Palliative systemic chemotherapy with Zepzelca 3.2 mg/m2 IV every 3 weeks. First dose 04/11/22.  Status post 2 cycles.  INTERVAL HISTORY: Haile Bosler 76 y.o. male returns to the clinic today for a follow-up visit accompanied by his wife. The patient's wife mentioned that he has been losing weight. He reports he is not as hungry. She cooks him food but he reportedly is feeding it to the dog. He reportedly is only eating junk food. He is not drinking any supplemental drinks. He lost about 8 lbs since last being seen. He also is reporting fatigue. He is currently undergoing chemotherapy with Zepzelca.  He status post 2 cycles tolerating it well except for fatigue.  The patient denies any recent fever, chills, or night sweats.  Denies any cough or hemoptysis. In the mornings, he reports he sometimes has a band-link chest discomfort across his chest that is relieved by walking and drinking coffee. He reports baseline dyspnea on exertion.  Denies any nausea, vomiting, diarrhea, or constipation.  Denies any headache or visual changes.  The patient is also followed closely by endocrinology for his hypothyroidism. Per chart review, he was supposed to have a follow up in 6 weeks about 6  weeks ago. They do not have an appointment scheduled with endocrinology for his thyroid dysfunction. He is also followed by the [facility] for his hypertension.  He is here today for further evaluation and repeat blood work before starting cycle #3.   MEDICAL HISTORY: Past Medical History:  Diagnosis Date   Bipolar disorder ([place])    Depression    GERD (gastroesophageal reflux disease)    Hyperlipidemia    Hypertension    Orthostatic hypotension    Sleep apnea    has a cpap and doesn't use it   Syncope and collapse    Tobacco use     ALLERGIES:  has No Known Allergies.  MEDICATIONS:  Current Outpatient Medications  Medication Sig Dispense Refill   ARIPiprazole (ABILIFY) 5 MG tablet Take 5 mg by mouth daily. For mood stabilization and depression     calcium-vitamin D (OSCAL WITH D) 500-200 MG-UNIT tablet Take 1 tablet by mouth daily.     cyanocobalamin 500 MCG tablet Take 500 mcg by mouth daily. Vitamin B12     divalproex (DEPAKOTE) 500 MG DR tablet Take by mouth.     folic acid (FOLVITE) 1 MG tablet Take 1 tablet by mouth daily.     hydrochlorothiazide (HYDRODIURIL) 25 MG tablet Take 12.5 mg by mouth daily.     levothyroxine (SYNTHROID) 75 MCG tablet Take 1 tablet by mouth daily(morning) on empty stomach 30 tablet 2   lidocaine-prilocaine (EMLA) cream Apply to the Port-A-Cath site 30-60-minute before chemotherapy 30 g 0   lisinopril (PRINIVIL,ZESTRIL) 40 MG tablet Take 1 tablet (40 mg  total) by mouth daily. For hypertension. (Patient taking differently: Take 40 mg by mouth daily. For blood pressure)     MELATONIN PO Take 1 tablet by mouth at bedtime.     prochlorperazine (COMPAZINE) 10 MG tablet Take 1 tablet (10 mg total) by mouth every 6 (six) hours as needed for nausea or vomiting. 30 tablet 0   propranolol (INDERAL) 10 MG tablet Take 10 mg by mouth 2 (two) times daily.     rosuvastatin (CRESTOR) 40 MG tablet Take 40 mg by mouth daily.     Tiotropium Bromide-Olodaterol 2.5-2.5 MCG/ACT  AERS INHALE 2 PUFFS BY MOUTH DAILY     traZODone (DESYREL) 150 MG tablet Take 150 mg by mouth at bedtime as needed for sleep.     venlafaxine XR (EFFEXOR-XR) 75 MG 24 hr capsule Take 75 mg by mouth daily with breakfast.     No current facility-administered medications for this visit.   Facility-Administered Medications Ordered in Other Visits  Medication Dose Route Frequency Provider Last Rate Last Admin   heparin lock flush 100 unit/mL  500 Units Intracatheter Once PRN Curt Bears, MD       sodium chloride flush (NS) 0.9 % injection 10 mL  10 mL Intracatheter PRN Curt Bears, MD        SURGICAL HISTORY:  Past Surgical History:  Procedure Laterality Date   BRONCHIAL BIOPSY  08/30/2021   Procedure: BRONCHIAL BIOPSIES;  Surgeon: Garner Nash, DO;  Location: [facility];  Service: Pulmonary;;   BRONCHIAL BRUSHINGS  08/30/2021   Procedure: BRONCHIAL BRUSHINGS;  Surgeon: Garner Nash, DO;  Location: [facility];  Service: Pulmonary;;   BRONCHIAL NEEDLE ASPIRATION BIOPSY  08/30/2021   Procedure: BRONCHIAL NEEDLE ASPIRATION BIOPSIES;  Surgeon: Garner Nash, DO;  Location: [facility];  Service: Pulmonary;;   BRONCHIAL WASHINGS  08/30/2021   Procedure: BRONCHIAL WASHINGS;  Surgeon: Garner Nash, DO;  Location: [facility];  Service: Pulmonary;;   COLONOSCOPY     FINE NEEDLE ASPIRATION  08/30/2021   Procedure: FINE NEEDLE ASPIRATION (FNA) LINEAR;  Surgeon: Garner Nash, DO;  Location: [facility];  Service: Pulmonary;;   HERNIA REPAIR     inquinal   IR IMAGING GUIDED PORT INSERTION  09/26/2021   VIDEO BRONCHOSCOPY WITH ENDOBRONCHIAL NAVIGATION Left 08/30/2021   Procedure: VIDEO BRONCHOSCOPY WITH ENDOBRONCHIAL NAVIGATION;  Surgeon: Garner Nash, DO;  Location: [facility];  Service: Pulmonary;  Laterality: Left;  ION w/ CIOS   VIDEO BRONCHOSCOPY WITH ENDOBRONCHIAL ULTRASOUND Left 08/30/2021   Procedure: VIDEO BRONCHOSCOPY WITH ENDOBRONCHIAL ULTRASOUND;  Surgeon:  Garner Nash, DO;  Location: [facility];  Service: Pulmonary;  Laterality: Left;   VIDEO BRONCHOSCOPY WITH RADIAL ENDOBRONCHIAL ULTRASOUND  08/30/2021   Procedure: RADIAL ENDOBRONCHIAL ULTRASOUND;  Surgeon: Garner Nash, DO;  Location: [facility];  Service: Pulmonary;;    REVIEW OF SYSTEMS:   Constitutional: Positive for fatigue, decreased appetite, and weight loss.  Negative for chills and fever.  HENT: Negative for mouth sores, nosebleeds, sore throat and trouble swallowing.   Eyes: Negative for eye problems and icterus.  Respiratory: Positive for baseline dyspnea on exertion. Negative for cough, hemoptysis, and wheezing.    Cardiovascular: Negative for chest pain and leg swelling.  Gastrointestinal: Negative for abdominal pain, constipation, diarrhea, nausea and vomiting.  Genitourinary: Negative for bladder incontinence, difficulty urinating, dysuria, frequency and hematuria.   Musculoskeletal: Negative for back pain, gait problem, neck pain and neck stiffness.  Skin: Negative for itching and rash.  Neurological: Negative  for dizziness, extremity weakness, gait problem, headaches, light-headedness and seizures.  Hematological: Negative for adenopathy. Does not bruise/bleed easily.  Psychiatric/Behavioral: Negative for confusion, depression and sleep disturbance. The patient is not nervous/anxious.     PHYSICAL EXAMINATION:  Blood pressure 103/85, pulse 97, temperature 98.5 F (36.9 C), resp. rate 20, weight 205 lb 6.4 oz (93.2 kg), SpO2 100 %.  ECOG PERFORMANCE STATUS: 1  Physical Exam  Constitutional: Oriented to person, place, and time and well-developed, well-nourished, and in no distress.  HENT:  Head: Normocephalic and atraumatic.  Mouth/Throat: Oropharynx is clear and moist. No oropharyngeal exudate.  Eyes: Conjunctivae are normal. Right eye exhibits no discharge. Left eye exhibits no discharge. No scleral icterus.  Neck: Normal range of motion. Neck supple.   Cardiovascular: Normal rate, regular rhythm, normal heart sounds and intact distal pulses.   Pulmonary/Chest: Effort normal and breath sounds normal. No respiratory distress. No wheezes. No rales.  Abdominal: Soft. Bowel sounds are normal. Exhibits no distension and no mass. There is no tenderness.  Musculoskeletal: Normal range of motion. Exhibits no edema.  Lymphadenopathy:    No cervical adenopathy.  Neurological: Alert and oriented to person, place, and time. Exhibits normal muscle tone. Gait normal. Coordination normal.  Skin: Skin is warm and dry. No rash noted. Not diaphoretic. No erythema. No pallor.  Psychiatric: Mood, memory and judgment normal.  Vitals reviewed.  LABORATORY DATA: Lab Results  Component Value Date   WBC 2.9 (L) 05/23/2022   HGB 11.6 (L) 05/23/2022   HCT 30.9 (L) 05/23/2022   MCV 89.8 05/23/2022   PLT 206 05/23/2022      Chemistry      Component Value Date/Time   NA 134 (L) 05/23/2022 0912   K 3.3 (L) 05/23/2022 0912   CL 100 05/23/2022 0912   CO2 27 05/23/2022 0912   BUN 13 05/23/2022 0912   CREATININE 1.10 05/23/2022 0912      Component Value Date/Time   CALCIUM 9.4 05/23/2022 0912   ALKPHOS 59 05/23/2022 0912   AST 14 (L) 05/23/2022 0912   ALT 11 05/23/2022 0912   BILITOT 1.2 05/23/2022 0912       RADIOGRAPHIC STUDIES:  No results found.   ASSESSMENT/PLAN:  This is a very pleasant 76 year old Caucasian male diagnosed with extensive stage (T1b, N3, M1 a) small cell lung cancer.  He presented with a right upper lobe lung nodule in addition to multiple left lung nodules and bilateral mediastinal lymphadenopathy.  He was diagnosed in November 2022.   The patient previously underwent treatment with palliative systemic chemotherapy with carboplatin for an AUC of 5 on day 1, etoposide 100 mg per metered squared on days 1, 2, and 3 and Imfinzi on day 115 mg IV every 3 weeks.  He receives [employer] for myeloprotection.  He is status post 7 cycles.   Starting from cycle #5, the patient had been on single agent immunotherapy with Imfinzi IV every 4 weeks. He tolerated treatment very well without any concerning adverse side effects of significant lab abnormalities.  He was discontinued secondary to disease progression.  The patient is currently undergoing second line palliative systemic chemotherapy with Zepzelca 3.2 mg per metered squared IV every 3 weeks.  He is status post 2 cycles.  He has been tolerating it well without any concerning adverse side effects.    Labs were reviewed. His total WBC is 2.9. His ANC is 0.9. We will delay treatment by 1 week to allow more time for his labs to  recover.   The patient and his wife has been missing his weekly lab appointments. They thought these were just port flush appointments. I educated them that these are labs. Although his labs previously were stable, given the low WBC, I would like to continue to monitor this on a weekly basis. We will arrange for GCSF if needed if he develops significant neutropenia.   We will see him back for follow-up visit in 1 week for evaluation and repeat blood work before starting cycle #3.  I will arrange for restaging CT scan of the chest, abdomen, pelvis after cycle #3. I am concerned with the amount of weight loss which could be related to progressive malignancy, thyroid dysfunction, or treatment side effect. Discussed the importance of ensuring the scan is scheduled in the appropriate time frame.   I gave him the number to endocrinology to schedule his 6 week follow up labs.   I encouraged him to drink boost and ensure. Encouraged small frequent meals. Discussed there is not a lot of nutritional content in the junk food and would recommend making healthier choices which may help his energy and fatigue. As previously stated, we will ensure no disease progression on upcoming scan contributing to this weight loss.   The patient was advised to call immediately if he has any  concerning symptoms in the interval. The patient voices understanding of current disease status and treatment options and is in agreement with the current care plan. All questions were answered. The patient knows to call the clinic with any problems, questions or concerns. We can certainly see the patient much sooner if necessary           Orders Placed This Encounter  Procedures   CT Chest W Contrast    Standing Status:   Future    Standing Expiration Date:   05/23/2023    Order Specific Question:   If indicated for the ordered procedure, I authorize the administration of contrast media per Radiology protocol    Answer:   Yes    Order Specific Question:   Preferred imaging location?    Answer:   [facility]   CT Abdomen Pelvis W Contrast    Standing Status:   Future    Standing Expiration Date:   05/23/2023    Order Specific Question:   If indicated for the ordered procedure, I authorize the administration of contrast media per Radiology protocol    Answer:   Yes    Order Specific Question:   Preferred imaging location?    Answer:   [facility]    Order Specific Question:   Is Oral Contrast requested for this exam?    Answer:   Yes, Per Radiology protocol     The total time spent in the appointment was 30-39 minutes.   Arnesia Vincelette L Dominque Marlin, PA-C 05/23/22

## 2022-05-23 ENCOUNTER — Other Ambulatory Visit: Payer: Self-pay

## 2022-05-23 ENCOUNTER — Inpatient Hospital Stay: Payer: No Typology Code available for payment source

## 2022-05-23 ENCOUNTER — Inpatient Hospital Stay: Payer: No Typology Code available for payment source | Attending: Internal Medicine | Admitting: Physician Assistant

## 2022-05-23 ENCOUNTER — Encounter: Payer: Self-pay | Admitting: Physician Assistant

## 2022-05-23 VITALS — BP 103/85 | HR 97 | Temp 98.5°F | Resp 20 | Wt 205.4 lb

## 2022-05-23 DIAGNOSIS — I10 Essential (primary) hypertension: Secondary | ICD-10-CM | POA: Diagnosis not present

## 2022-05-23 DIAGNOSIS — E039 Hypothyroidism, unspecified: Secondary | ICD-10-CM | POA: Diagnosis not present

## 2022-05-23 DIAGNOSIS — R634 Abnormal weight loss: Secondary | ICD-10-CM | POA: Insufficient documentation

## 2022-05-23 DIAGNOSIS — C3411 Malignant neoplasm of upper lobe, right bronchus or lung: Secondary | ICD-10-CM | POA: Insufficient documentation

## 2022-05-23 DIAGNOSIS — C3481 Malignant neoplasm of overlapping sites of right bronchus and lung: Secondary | ICD-10-CM

## 2022-05-23 DIAGNOSIS — Z5111 Encounter for antineoplastic chemotherapy: Secondary | ICD-10-CM | POA: Diagnosis not present

## 2022-05-23 DIAGNOSIS — Z95828 Presence of other vascular implants and grafts: Secondary | ICD-10-CM

## 2022-05-23 LAB — CMP (CANCER CENTER ONLY)
ALT: 11 U/L (ref 0–44)
AST: 14 U/L — ABNORMAL LOW (ref 15–41)
Albumin: 4.4 g/dL (ref 3.5–5.0)
Alkaline Phosphatase: 59 U/L (ref 38–126)
Anion gap: 7 (ref 5–15)
BUN: 13 mg/dL (ref 8–23)
CO2: 27 mmol/L (ref 22–32)
Calcium: 9.4 mg/dL (ref 8.9–10.3)
Chloride: 100 mmol/L (ref 98–111)
Creatinine: 1.1 mg/dL (ref 0.61–1.24)
GFR, Estimated: >60 mL/min (ref >60)
Glucose, Bld: 131 mg/dL — ABNORMAL HIGH (ref 70–99)
Potassium: 3.3 mmol/L — ABNORMAL LOW (ref 3.5–5.1)
Sodium: 134 mmol/L — ABNORMAL LOW (ref 135–145)
Total Bilirubin: 1.2 mg/dL (ref 0.3–1.2)
Total Protein: 7.5 g/dL (ref 6.5–8.1)

## 2022-05-23 LAB — CBC WITH DIFFERENTIAL (CANCER CENTER ONLY)
Abs Immature Granulocytes: 0.02 10*3/uL (ref 0.00–0.07)
Basophils Absolute: 0 10*3/uL (ref 0.0–0.1)
Basophils Relative: 0 %
Eosinophils Absolute: 0.1 10*3/uL (ref 0.0–0.5)
Eosinophils Relative: 2 %
HCT: 30.9 % — ABNORMAL LOW (ref 39.0–52.0)
Hemoglobin: 11.6 g/dL — ABNORMAL LOW (ref 13.0–17.0)
Immature Granulocytes: 1 %
Lymphocytes Relative: 40 %
Lymphs Abs: 1.2 10*3/uL (ref 0.7–4.0)
MCH: 33.7 pg (ref 26.0–34.0)
MCHC: 37.5 g/dL — ABNORMAL HIGH (ref 30.0–36.0)
MCV: 89.8 fL (ref 80.0–100.0)
Monocytes Absolute: 0.8 10*3/uL (ref 0.1–1.0)
Monocytes Relative: 27 %
Neutro Abs: 0.9 10*3/uL — ABNORMAL LOW (ref 1.7–7.7)
Neutrophils Relative %: 30 %
Platelet Count: 206 10*3/uL (ref 150–400)
RBC: 3.44 MIL/uL — ABNORMAL LOW (ref 4.22–5.81)
RDW: 13.8 % (ref 11.5–15.5)
Smear Review: NORMAL
WBC Count: 2.9 10*3/uL — ABNORMAL LOW (ref 4.0–10.5)
nRBC: 0 % (ref 0.0–0.2)

## 2022-05-23 MED ORDER — SODIUM CHLORIDE 0.9% FLUSH
10.0000 mL | INTRAVENOUS | Status: DC | PRN
  Administered 2022-05-23: 10 mL

## 2022-05-23 MED ORDER — SODIUM CHLORIDE 0.9% FLUSH
10.0000 mL | Freq: Once | INTRAVENOUS | Status: AC
  Administered 2022-05-23: 10 mL

## 2022-05-23 MED ORDER — HEPARIN SOD (PORK) LOCK FLUSH 100 UNIT/ML IV SOLN
500.0000 [IU] | Freq: Once | INTRAVENOUS | Status: AC | PRN
  Administered 2022-05-23: 500 [IU]

## 2022-05-23 NOTE — Progress Notes (Signed)
Per Cassie, PA no treatment today with ANC 0.9 K/uL

## 2022-05-25 ENCOUNTER — Other Ambulatory Visit: Payer: Self-pay

## 2022-05-25 ENCOUNTER — Telehealth: Payer: Self-pay | Admitting: Internal Medicine

## 2022-05-25 NOTE — Telephone Encounter (Signed)
Scheduled per 08/01 los, called and spoke with patient's spouse. Patient will be notified.

## 2022-05-25 NOTE — Progress Notes (Signed)
[place] [address]  DIAGNOSIS: Extensive stage (T1b, N3, M1 a) small cell lung cancer presented with right upper lobe nodule in addition to multiple left lung nodule as well as bilateral hilar and mediastinal lymphadenopathy diagnosed in November 2022.  PRIOR THERAPY: Systemic chemotherapy with carboplatin for AUC of 5 on day 1 and etoposide 100 Mg/M2 on days 1, 2 and 3 with Cosela for Myeloprotection as well as Imfinzi 1500 Mg IV every 3 weeks.  Started September 20, 2021.  Status post 8 cycles.  Starting from cycle #5, the patient started maintenance immunotherapy with Imfinzi. Last dose on 03/07/22    CURRENT THERAPY:  Palliative systemic chemotherapy with Zepzelca 3.2 mg/m2 IV every 3 weeks. First dose 04/11/22.  Status post 2 cycles.  INTERVAL HISTORY: Austin Wells 76 y.o. male returns to the clinic today for a follow-up visit accompanied by his wife.  The patient was seen last week on 05/23/2022.  At that point time, the patient had some neutropenia and his treatment was delayed by 1 week until he can have repeat labs and be evaluated today.  At his last appointment, we discussed that the patient has been losing weight and he has not been feeling as hungry.  His wife has been cooking food for him and he is reportedly been feeding it to the dog.  He apparently is only eating junk food and not drinking any supplemental drinks.  He had loss 10 pounds in a 3-week interval. His weight is stable compared to last week. He is reporting ongoing fatigue/weakness but it has been worsening lately. He apparently sleeps a lot of the day but is up all night. A restaging CT scan was ordered at his last appointment for this month. It has not been scheduled at this time.  We would like to ensure no disease progression with his increased fatigue, weakness, and weight loss. Additionally, he has significant  thyroid dysfunction and sees endocrinology. At his last appointment, I noticed he is overdue for a follow up. I gave them the number to their office and instructed them to call to schedule an appointment. They have not had a chance to call at this time.   He denies any fever, chills, or night sweats.  Denies any cough or hemoptysis.  Reports baseline dyspnea on exertion.  He continues to smoke cigarettes. His wife mentions he is breathing "louder". Per chart review, he was seen by pulmonology in November 2022. He was supposed to start Spiriva but left the office before he can get his sample. Therefore, he is not prescribed anything for his COPD. Denies any nausea, vomiting, diarrhea, or constipation. Denies any headache or visual changes.  He is here today for evaluation and repeat blood work before considering starting cycle #3.  MEDICAL HISTORY: Past Medical History:  Diagnosis Date   Bipolar disorder ([place])    Depression    GERD (gastroesophageal reflux disease)    Hyperlipidemia    Hypertension    Orthostatic hypotension    Sleep apnea    has a cpap and doesn't use it   Syncope and collapse    Tobacco use     ALLERGIES:  has No Known Allergies.  MEDICATIONS:  Current Outpatient Medications  Medication Sig Dispense Refill   ARIPiprazole (ABILIFY) 5 MG tablet Take 5 mg by mouth daily. For mood stabilization and depression     calcium-vitamin D (OSCAL WITH D) 500-200  MG-UNIT tablet Take 1 tablet by mouth daily.     cyanocobalamin 500 MCG tablet Take 500 mcg by mouth daily. Vitamin B12     divalproex (DEPAKOTE) 500 MG DR tablet Take by mouth.     folic acid (FOLVITE) 1 MG tablet Take 1 tablet by mouth daily.     hydrochlorothiazide (HYDRODIURIL) 25 MG tablet Take 12.5 mg by mouth daily.     levothyroxine (SYNTHROID) 75 MCG tablet Take 1 tablet by mouth daily(morning) on empty stomach 30 tablet 2   lidocaine-prilocaine (EMLA) cream Apply to the Port-A-Cath site 30-60-minute before  chemotherapy 30 g 0   lisinopril (PRINIVIL,ZESTRIL) 40 MG tablet Take 1 tablet (40 mg total) by mouth daily. For hypertension. (Patient taking differently: Take 40 mg by mouth daily. For blood pressure)     MELATONIN PO Take 1 tablet by mouth at bedtime.     prochlorperazine (COMPAZINE) 10 MG tablet Take 1 tablet (10 mg total) by mouth every 6 (six) hours as needed for nausea or vomiting. 30 tablet 0   propranolol (INDERAL) 10 MG tablet Take 10 mg by mouth 2 (two) times daily.     rosuvastatin (CRESTOR) 40 MG tablet Take 40 mg by mouth daily.     Tiotropium Bromide-Olodaterol 2.5-2.5 MCG/ACT AERS INHALE 2 PUFFS BY MOUTH DAILY     traZODone (DESYREL) 150 MG tablet Take 150 mg by mouth at bedtime as needed for sleep.     venlafaxine XR (EFFEXOR-XR) 75 MG 24 hr capsule Take 75 mg by mouth daily with breakfast.     No current facility-administered medications for this visit.   Facility-Administered Medications Ordered in Other Visits  Medication Dose Route Frequency Provider Last Rate Last Admin   dexamethasone (DECADRON) 10 mg in sodium chloride 0.9 % 50 mL IVPB  10 mg Intravenous Once Curt Bears, MD 204 mL/hr at 05/31/22 1254 10 mg at 05/31/22 1254   heparin lock flush 100 unit/mL  500 Units Intracatheter Once PRN Curt Bears, MD       lurbinectedin [facility]) 7.2 mg in sodium chloride 0.9 % 250 mL chemo infusion  3.2 mg/m2 (Treatment Plan Recorded) Intravenous Once Curt Bears, MD       sodium chloride flush (NS) 0.9 % injection 10 mL  10 mL Intracatheter PRN Curt Bears, MD        SURGICAL HISTORY:  Past Surgical History:  Procedure Laterality Date   BRONCHIAL BIOPSY  08/30/2021   Procedure: BRONCHIAL BIOPSIES;  Surgeon: Garner Nash, DO;  Location: [facility];  Service: Pulmonary;;   BRONCHIAL BRUSHINGS  08/30/2021   Procedure: BRONCHIAL BRUSHINGS;  Surgeon: Garner Nash, DO;  Location: [facility];  Service: Pulmonary;;   BRONCHIAL NEEDLE ASPIRATION BIOPSY   08/30/2021   Procedure: BRONCHIAL NEEDLE ASPIRATION BIOPSIES;  Surgeon: Garner Nash, DO;  Location: [facility];  Service: Pulmonary;;   BRONCHIAL WASHINGS  08/30/2021   Procedure: BRONCHIAL WASHINGS;  Surgeon: Garner Nash, DO;  Location: [facility];  Service: Pulmonary;;   COLONOSCOPY     FINE NEEDLE ASPIRATION  08/30/2021   Procedure: FINE NEEDLE ASPIRATION (FNA) LINEAR;  Surgeon: Garner Nash, DO;  Location: [facility];  Service: Pulmonary;;   HERNIA REPAIR     inquinal   IR IMAGING GUIDED PORT INSERTION  09/26/2021   VIDEO BRONCHOSCOPY WITH ENDOBRONCHIAL NAVIGATION Left 08/30/2021   Procedure: VIDEO BRONCHOSCOPY WITH ENDOBRONCHIAL NAVIGATION;  Surgeon: Garner Nash, DO;  Location: [facility];  Service: Pulmonary;  Laterality: Left;  ION w/  CIOS   VIDEO BRONCHOSCOPY WITH ENDOBRONCHIAL ULTRASOUND Left 08/30/2021   Procedure: VIDEO BRONCHOSCOPY WITH ENDOBRONCHIAL ULTRASOUND;  Surgeon: Garner Nash, DO;  Location: [facility];  Service: Pulmonary;  Laterality: Left;   VIDEO BRONCHOSCOPY WITH RADIAL ENDOBRONCHIAL ULTRASOUND  08/30/2021   Procedure: RADIAL ENDOBRONCHIAL ULTRASOUND;  Surgeon: Garner Nash, DO;  Location: [facility];  Service: Pulmonary;;    REVIEW OF SYSTEMS:   Constitutional: Positive for fatigue, decreased appetite, and weight loss.  Negative for chills and fever.  HENT: Negative for mouth sores, nosebleeds, sore throat and trouble swallowing.   Eyes: Negative for eye problems and icterus.  Respiratory: Positive for baseline dyspnea on exertion. Negative for cough, hemoptysis, and wheezing.    Cardiovascular: Negative for chest pain and leg swelling.  Gastrointestinal: Negative for abdominal pain, constipation, diarrhea, nausea and vomiting.  Genitourinary: Negative for bladder incontinence, difficulty urinating, dysuria, frequency and hematuria.   Musculoskeletal: Negative for back pain, gait problem, neck pain and neck stiffness.  Skin:  Negative for itching and rash.  Neurological: Negative for dizziness, extremity weakness, gait problem, headaches, light-headedness and seizures.  Hematological: Negative for adenopathy. Does not bruise/bleed easily.  Psychiatric/Behavioral: Negative for confusion, depression and sleep disturbance. The patient is not nervous/anxious.       PHYSICAL EXAMINATION:  Blood pressure (!) 119/90, pulse (!) 108, temperature (!) 97.5 F (36.4 C), temperature source Tympanic, resp. rate 18, height 5\' 11"  (1.803 m), weight 205 lb 12.8 oz (93.4 kg), SpO2 100 %.  ECOG PERFORMANCE STATUS: 1  Physical Exam  Constitutional: Oriented to person, place, and time and well-developed, well-nourished, and in no distress.  HENT:  Head: Normocephalic and atraumatic.  Mouth/Throat: Oropharynx is clear and moist. No oropharyngeal exudate.  Eyes: Conjunctivae are normal. Right eye exhibits no discharge. Left eye exhibits no discharge. No scleral icterus.  Neck: Normal range of motion. Neck supple.  Cardiovascular: Normal rate, regular rhythm, normal heart sounds and intact distal pulses.   Pulmonary/Chest: Effort normal. Mild wheezing noted on exam bilaterally. No respiratory distress. No wheezes. No rales.  Abdominal: Soft. Bowel sounds are normal. Exhibits no distension and no mass. There is no tenderness.  Musculoskeletal: Normal range of motion. Exhibits no edema.  Lymphadenopathy:    No cervical adenopathy.  Neurological: Alert and oriented to person, place, and time. Exhibits normal muscle tone. Gait normal. Coordination normal.  Skin: Skin is warm and dry. No rash noted. Not diaphoretic. No erythema. No pallor.  Psychiatric: Mood, memory and judgment normal.  Vitals reviewed.  LABORATORY DATA: Lab Results  Component Value Date   WBC 5.7 05/31/2022   HGB 12.2 (L) 05/31/2022   HCT 32.9 (L) 05/31/2022   MCV 90.4 05/31/2022   PLT 128 (L) 05/31/2022      Chemistry      Component Value Date/Time   NA  134 (L) 05/31/2022 1115   K 3.8 05/31/2022 1115   CL 101 05/31/2022 1115   CO2 26 05/31/2022 1115   BUN 17 05/31/2022 1115   CREATININE 0.97 05/31/2022 1115      Component Value Date/Time   CALCIUM 9.3 05/31/2022 1115   ALKPHOS 59 05/31/2022 1115   AST 26 05/31/2022 1115   ALT 18 05/31/2022 1115   BILITOT 1.1 05/31/2022 1115       RADIOGRAPHIC STUDIES:  No results found.   ASSESSMENT/PLAN:  This is a very pleasant 76 year old Caucasian male diagnosed with extensive stage (T1b, N3, M1 a) small cell lung cancer.  He presented with a right  upper lobe lung nodule in addition to multiple left lung nodules and bilateral mediastinal lymphadenopathy.  He was diagnosed in November 2022.   The patient previously underwent treatment with palliative systemic chemotherapy with carboplatin for an AUC of 5 on day 1, etoposide 100 mg per metered squared on days 1, 2, and 3 and Imfinzi on day 115 mg IV every 3 weeks.  He receives Cisco for myeloprotection.  He is status post 7 cycles.  Starting from cycle #5, the patient had been on single agent immunotherapy with Imfinzi IV every 4 weeks. He tolerated treatment very well without any concerning adverse side effects of significant lab abnormalities.  He was discontinued secondary to disease progression.  The patient is currently undergoing second line palliative systemic chemotherapy with Zepzelca 3.2 mg per metered squared IV every 3 weeks.  He is status post 2 cycles.  He has been tolerating it well without any concerning adverse side effects except for fatigue.   Labs were reviewed.  Recommend that he proceed cycle #3 today scheduled.  We will continue to monitor his blood work closely on a weekly basis.   We will see him back for follow-up visit in 3 weeks for evaluation and repeat blood work and to review his upcoming scan.  I am concerned with his fatigue/weakness and weight loss he may have progression.   We will continue to follow closely  with endocrinology regarding his follow-up. I gave them the number to their office and strongly encouraged him to follow up.   He is not eating or drinking a lot. He is mildly tachycardic today with a pulse of 108. I will arrange for him to receive 1/2 a L of fluids today. Encouraged him to increase his caloric and protein intake.   His oxygen ins 100%. His wife mentions he is breathing "louder". He does seem to be breathing through his mouth a lot today. He has some mild wheezing bilaterally on exam. Per chart review, it appears he was supposed to receive Spiriva through pulmonology office. I gave the patient the number to their office to discuss how to go about starting that prescription. He was strongly encouraged to quit smoking. Additionally, we will assess for disease progression on upcoming imaging.   The patient was advised to call immediately if he has any concerning symptoms in the interval. The patient voices understanding of current disease status and treatment options and is in agreement with the current care plan. All questions were answered. The patient knows to call the clinic with any problems, questions or concerns. We can certainly see the patient much sooner if necessary        No orders of the defined types were placed in this encounter.    The total time spent in the appointment was 20-29 minutes.   Rudolph Dobler L Mikisha Roseland, PA-C 05/31/22

## 2022-05-30 ENCOUNTER — Inpatient Hospital Stay: Payer: No Typology Code available for payment source

## 2022-05-31 ENCOUNTER — Other Ambulatory Visit: Payer: No Typology Code available for payment source

## 2022-05-31 ENCOUNTER — Other Ambulatory Visit: Payer: Self-pay

## 2022-05-31 ENCOUNTER — Inpatient Hospital Stay (HOSPITAL_BASED_OUTPATIENT_CLINIC_OR_DEPARTMENT_OTHER): Payer: No Typology Code available for payment source | Admitting: Physician Assistant

## 2022-05-31 ENCOUNTER — Inpatient Hospital Stay: Payer: No Typology Code available for payment source

## 2022-05-31 VITALS — BP 119/90 | HR 108 | Temp 97.5°F | Resp 18 | Ht 71.0 in | Wt 205.8 lb

## 2022-05-31 VITALS — BP 106/81 | HR 100 | Resp 18

## 2022-05-31 DIAGNOSIS — C3481 Malignant neoplasm of overlapping sites of right bronchus and lung: Secondary | ICD-10-CM

## 2022-05-31 DIAGNOSIS — R000 Tachycardia, unspecified: Secondary | ICD-10-CM | POA: Diagnosis not present

## 2022-05-31 DIAGNOSIS — Z5111 Encounter for antineoplastic chemotherapy: Secondary | ICD-10-CM | POA: Diagnosis not present

## 2022-05-31 DIAGNOSIS — Z95828 Presence of other vascular implants and grafts: Secondary | ICD-10-CM

## 2022-05-31 LAB — CMP (CANCER CENTER ONLY)
ALT: 18 U/L (ref 0–44)
AST: 26 U/L (ref 15–41)
Albumin: 4.6 g/dL (ref 3.5–5.0)
Alkaline Phosphatase: 59 U/L (ref 38–126)
Anion gap: 7 (ref 5–15)
BUN: 17 mg/dL (ref 8–23)
CO2: 26 mmol/L (ref 22–32)
Calcium: 9.3 mg/dL (ref 8.9–10.3)
Chloride: 101 mmol/L (ref 98–111)
Creatinine: 0.97 mg/dL (ref 0.61–1.24)
GFR, Estimated: >60 mL/min (ref >60)
Glucose, Bld: 138 mg/dL — ABNORMAL HIGH (ref 70–99)
Potassium: 3.8 mmol/L (ref 3.5–5.1)
Sodium: 134 mmol/L — ABNORMAL LOW (ref 135–145)
Total Bilirubin: 1.1 mg/dL (ref 0.3–1.2)
Total Protein: 8 g/dL (ref 6.5–8.1)

## 2022-05-31 LAB — CBC WITH DIFFERENTIAL (CANCER CENTER ONLY)
Abs Immature Granulocytes: 0.03 10*3/uL (ref 0.00–0.07)
Basophils Absolute: 0 10*3/uL (ref 0.0–0.1)
Basophils Relative: 0 %
Eosinophils Absolute: 0 10*3/uL (ref 0.0–0.5)
Eosinophils Relative: 1 %
HCT: 32.9 % — ABNORMAL LOW (ref 39.0–52.0)
Hemoglobin: 12.2 g/dL — ABNORMAL LOW (ref 13.0–17.0)
Immature Granulocytes: 1 %
Lymphocytes Relative: 27 %
Lymphs Abs: 1.6 10*3/uL (ref 0.7–4.0)
MCH: 33.5 pg (ref 26.0–34.0)
MCHC: 37.1 g/dL — ABNORMAL HIGH (ref 30.0–36.0)
MCV: 90.4 fL (ref 80.0–100.0)
Monocytes Absolute: 0.7 10*3/uL (ref 0.1–1.0)
Monocytes Relative: 13 %
Neutro Abs: 3.4 10*3/uL (ref 1.7–7.7)
Neutrophils Relative %: 58 %
Platelet Count: 128 10*3/uL — ABNORMAL LOW (ref 150–400)
RBC: 3.64 MIL/uL — ABNORMAL LOW (ref 4.22–5.81)
RDW: 14.1 % (ref 11.5–15.5)
WBC Count: 5.7 10*3/uL (ref 4.0–10.5)
nRBC: 0 % (ref 0.0–0.2)

## 2022-05-31 MED ORDER — SODIUM CHLORIDE 0.9% FLUSH
10.0000 mL | INTRAVENOUS | Status: DC | PRN
  Administered 2022-05-31: 10 mL

## 2022-05-31 MED ORDER — HEPARIN SOD (PORK) LOCK FLUSH 100 UNIT/ML IV SOLN
500.0000 [IU] | Freq: Once | INTRAVENOUS | Status: AC | PRN
  Administered 2022-05-31: 500 [IU]

## 2022-05-31 MED ORDER — SODIUM CHLORIDE 0.9 % IV SOLN: Freq: Once | INTRAVENOUS | Status: AC

## 2022-05-31 MED ORDER — SODIUM CHLORIDE 0.9 % IV SOLN
10.0000 mg | Freq: Once | INTRAVENOUS | Status: AC
  Administered 2022-05-31: 10 mg via INTRAVENOUS
  Filled 2022-05-31: qty 10

## 2022-05-31 MED ORDER — PALONOSETRON HCL INJECTION 0.25 MG/5ML
0.2500 mg | Freq: Once | INTRAVENOUS | Status: AC
  Administered 2022-05-31: 0.25 mg via INTRAVENOUS

## 2022-05-31 MED ORDER — SODIUM CHLORIDE 0.9 % IV SOLN
3.2000 mg/m2 | Freq: Once | INTRAVENOUS | Status: AC
  Administered 2022-05-31: 7.2 mg via INTRAVENOUS
  Filled 2022-05-31: qty 14.4

## 2022-05-31 NOTE — Progress Notes (Signed)
Per Cassie PA-C OK to proceed w/ tx today w/ elevated HR of 108

## 2022-05-31 NOTE — Patient Instructions (Signed)
[facility]  Discharge Instructions: Thank you for choosing [facility] to provide your oncology and hematology care.   If you have a lab appointment with the [facility], please go directly to the [facility] and check in at the registration area.   Wear comfortable clothing and clothing appropriate for easy access to any Portacath or PICC line.   We strive to give you quality time with your provider. You may need to reschedule your appointment if you arrive late (15 or more minutes).  Arriving late affects you and other patients whose appointments are after yours.  Also, if you miss three or more appointments without notifying the office, you may be dismissed from the clinic at the provider's discretion.      For prescription refill requests, have your pharmacy contact our office and allow 72 hours for refills to be completed.    Today you received the following chemotherapy and/or immunotherapy agents: Zepzelca      To help prevent nausea and vomiting after your treatment, we encourage you to take your nausea medication as directed.  BELOW ARE SYMPTOMS THAT SHOULD BE REPORTED IMMEDIATELY: *FEVER GREATER THAN 100.4 F (38 C) OR HIGHER *CHILLS OR SWEATING *NAUSEA AND VOMITING THAT IS NOT CONTROLLED WITH YOUR NAUSEA MEDICATION *UNUSUAL SHORTNESS OF BREATH *UNUSUAL BRUISING OR BLEEDING *URINARY PROBLEMS (pain or burning when urinating, or frequent urination) *BOWEL PROBLEMS (unusual diarrhea, constipation, pain near the anus) TENDERNESS IN MOUTH AND THROAT WITH OR WITHOUT PRESENCE OF ULCERS (sore throat, sores in mouth, or a toothache) UNUSUAL RASH, SWELLING OR PAIN  UNUSUAL VAGINAL DISCHARGE OR ITCHING   Items with * indicate a potential emergency and should be followed up as soon as possible or go to the Emergency Department if any problems should occur.  Please show the CHEMOTHERAPY ALERT CARD or IMMUNOTHERAPY ALERT CARD at check-in to  the Emergency Department and triage nurse.  Should you have questions after your visit or need to cancel or reschedule your appointment, please contact [facility]  Dept: [phone]  and follow the prompts.  Office hours are 8:00 a.m. to 4:30 p.m. Monday - Friday. Please note that voicemails left after 4:00 p.m. may not be returned until the following business day.  We are closed weekends and major holidays. You have access to a nurse at all times for urgent questions. Please call the main number to the clinic Dept: [phone] and follow the prompts.   For any non-urgent questions, you may also contact your provider using MyChart. We now offer e-Visits for anyone 13 and older to request care online for non-urgent symptoms. For details visit [email or web address].   Also download the MyChart app! Go to the app store, search "MyChart", open the app, select [facility], and log in with your MyChart username and password.  Masks are optional in the cancer centers. If you would like for your care team to wear a mask while they are taking care of you, please let them know. You may have one support person who is at least 74 years old accompany you for your appointments.

## 2022-06-01 ENCOUNTER — Other Ambulatory Visit: Payer: Self-pay

## 2022-06-06 ENCOUNTER — Inpatient Hospital Stay: Payer: No Typology Code available for payment source

## 2022-06-07 ENCOUNTER — Inpatient Hospital Stay: Payer: No Typology Code available for payment source

## 2022-06-11 ENCOUNTER — Emergency Department (HOSPITAL_COMMUNITY): Payer: Medicare Other

## 2022-06-11 ENCOUNTER — Observation Stay (HOSPITAL_COMMUNITY)
Admission: EM | Admit: 2022-06-11 | Discharge: 2022-06-12 | Disposition: A | Discharge status: Home / Self Care | Payer: Medicare Other | Attending: Internal Medicine | Admitting: Internal Medicine

## 2022-06-11 ENCOUNTER — Encounter (HOSPITAL_COMMUNITY): Payer: Self-pay

## 2022-06-11 ENCOUNTER — Other Ambulatory Visit: Payer: Self-pay

## 2022-06-11 DIAGNOSIS — R6510 Systemic inflammatory response syndrome (SIRS) of non-infectious origin without acute organ dysfunction: Secondary | ICD-10-CM | POA: Diagnosis not present

## 2022-06-11 DIAGNOSIS — E876 Hypokalemia: Secondary | ICD-10-CM

## 2022-06-11 DIAGNOSIS — C3490 Malignant neoplasm of unspecified part of unspecified bronchus or lung: Secondary | ICD-10-CM

## 2022-06-11 DIAGNOSIS — E039 Hypothyroidism, unspecified: Secondary | ICD-10-CM | POA: Diagnosis not present

## 2022-06-11 DIAGNOSIS — D61818 Other pancytopenia: Secondary | ICD-10-CM

## 2022-06-11 DIAGNOSIS — J9600 Acute respiratory failure, unspecified whether with hypoxia or hypercapnia: Secondary | ICD-10-CM | POA: Diagnosis not present

## 2022-06-11 DIAGNOSIS — J449 Chronic obstructive pulmonary disease, unspecified: Secondary | ICD-10-CM | POA: Insufficient documentation

## 2022-06-11 DIAGNOSIS — C3412 Malignant neoplasm of upper lobe, left bronchus or lung: Secondary | ICD-10-CM | POA: Diagnosis not present

## 2022-06-11 DIAGNOSIS — F319 Bipolar disorder, unspecified: Secondary | ICD-10-CM | POA: Diagnosis present

## 2022-06-11 DIAGNOSIS — I10 Essential (primary) hypertension: Secondary | ICD-10-CM | POA: Diagnosis present

## 2022-06-11 DIAGNOSIS — D709 Neutropenia, unspecified: Secondary | ICD-10-CM

## 2022-06-11 DIAGNOSIS — R0602 Shortness of breath: Secondary | ICD-10-CM

## 2022-06-11 DIAGNOSIS — Z20822 Contact with and (suspected) exposure to covid-19: Secondary | ICD-10-CM | POA: Insufficient documentation

## 2022-06-11 MED ORDER — IPRATROPIUM-ALBUTEROL 0.5-2.5 (3) MG/3ML IN SOLN
3.0000 mL | Freq: Once | RESPIRATORY_TRACT | Status: AC
  Administered 2022-06-11: 3 mL via RESPIRATORY_TRACT
  Filled 2022-06-11: qty 3

## 2022-06-11 NOTE — ED Provider Notes (Signed)
[facility] DEPT Provider Note   CSN: [id number] Arrival date & time: 06/11/22  2301     History {Add pertinent medical, surgical, social history, OB history to HPI:1} Chief Complaint  Patient presents with   Shortness of Breath    Austin Wells is a 76 y.o. male.  The history is provided by the patient and medical records.  Shortness of Breath Associated symptoms: cough    76 y.o. M with hx of CKD, bipolar disorder, HTN, hyperthyroidism, syncope, small cell lung cancer with active chemo (last treatment 05/31/22), presenting to the ED with SOB.  Wife called EMS as respiratory status has declined over the past few days.  Upon EMS arrival sats were appropriate but with respiratory rate in the 40s.  He was given DuoNeb and Solu-Medrol with some improvement.  He does report recent cough with phlegm that "looks like water".  He denies any significant chest pain.  No fevers.  Normal urine output.  He has been drinking fluids but appetite has been poor.  No vomiting or diarrhea.  Home Medications Prior to Admission medications   Medication Sig Start Date End Date Taking? Authorizing Provider  ARIPiprazole (ABILIFY) 5 MG tablet Take 5 mg by mouth daily. For mood stabilization and depression    [provider]  calcium-vitamin D (OSCAL WITH D) 500-200 MG-UNIT tablet Take 1 tablet by mouth daily.    [provider]  cyanocobalamin 500 MCG tablet Take 500 mcg by mouth daily. Vitamin B12    [provider]  divalproex (DEPAKOTE) 500 MG DR tablet Take by mouth.    [provider]  folic acid (FOLVITE) 1 MG tablet Take 1 tablet by mouth daily. 07/07/21   [provider]  hydrochlorothiazide (HYDRODIURIL) 25 MG tablet Take 12.5 mg by mouth daily.    [provider]  levothyroxine (SYNTHROID) 75 MCG tablet Take 1 tablet by mouth daily(morning) on empty stomach 04/07/22   Elayne Snare, MD  lidocaine-prilocaine (EMLA) cream  Apply to the Port-A-Cath site 30-60-minute before chemotherapy 09/09/21   Curt Bears, MD  lisinopril (PRINIVIL,ZESTRIL) 40 MG tablet Take 1 tablet (40 mg total) by mouth daily. For hypertension. Patient taking differently: Take 40 mg by mouth daily. For blood pressure 06/19/13   Mashburn, Milta Deiters T, PA-C  MELATONIN PO Take 1 tablet by mouth at bedtime.    [provider]  prochlorperazine (COMPAZINE) 10 MG tablet Take 1 tablet (10 mg total) by mouth every 6 (six) hours as needed for nausea or vomiting. 09/09/21   Curt Bears, MD  propranolol (INDERAL) 10 MG tablet Take 10 mg by mouth 2 (two) times daily.    [provider]  rosuvastatin (CRESTOR) 40 MG tablet Take 40 mg by mouth daily.    [provider]  Tiotropium Bromide-Olodaterol 2.5-2.5 MCG/ACT AERS INHALE 2 PUFFS BY MOUTH DAILY 02/19/21   [provider]  traZODone (DESYREL) 150 MG tablet Take 150 mg by mouth at bedtime as needed for sleep.    [provider]  venlafaxine XR (EFFEXOR-XR) 75 MG 24 hr capsule Take 75 mg by mouth daily with breakfast. 11/18/20   [provider]      Allergies    Patient has no known allergies.    Review of Systems   Review of Systems  Respiratory:  Positive for cough and shortness of breath.   All other systems reviewed and are negative.   Physical Exam Updated Vital Signs There were no vitals taken for this  visit.  Physical Exam Vitals and nursing note reviewed.  Constitutional:      Appearance: He is well-developed.  HENT:     Head: Normocephalic and atraumatic.  Eyes:     Conjunctiva/sclera: Conjunctivae normal.     Pupils: Pupils are equal, round, and reactive to light.  Cardiovascular:     Rate and Rhythm: Normal rate and regular rhythm.     Heart sounds: Normal heart sounds.  Pulmonary:     Effort: Pulmonary effort is normal. Tachypnea present.     Breath sounds: Wheezing and rhonchi present.     Comments: Tachypneic,  scattered wheezes and rhonchi present Chest:     Comments: Port right chest wall Abdominal:     General: Bowel sounds are normal.     Palpations: Abdomen is soft.  Musculoskeletal:        General: Normal range of motion.     Cervical back: Normal range of motion.  Skin:    General: Skin is warm and dry.  Neurological:     Mental Status: He is alert and oriented to person, place, and time.     ED Results / Procedures / Treatments   Labs (all labs ordered are listed, but only abnormal results are displayed) Labs Reviewed  CULTURE, BLOOD (ROUTINE X 2)  CULTURE, BLOOD (ROUTINE X 2)  URINE CULTURE  RESP PANEL BY RT-PCR (FLU A&B, COVID) ARPGX2  LACTIC ACID, PLASMA  CBC WITH DIFFERENTIAL/PLATELET  LACTIC ACID, PLASMA  COMPREHENSIVE METABOLIC PANEL  VALPROIC ACID LEVEL  BRAIN NATRIURETIC PEPTIDE  URINALYSIS, ROUTINE W REFLEX MICROSCOPIC  TROPONIN I (HIGH SENSITIVITY)    EKG None  Radiology No results found.  Procedures Procedures  {Document cardiac monitor, telemetry assessment procedure when appropriate:1}  Medications Ordered in ED Medications  ipratropium-albuterol (DUONEB) 0.5-2.5 (3) MG/3ML nebulizer solution 3 mL (has no administration in time range)    ED Course/ Medical Decision Making/ A&P                           Medical Decision Making Amount and/or Complexity of Data Reviewed Labs: ordered. Radiology: ordered. ECG/medicine tests: ordered.  Risk Prescription drug management.   ***  {Document critical care time when appropriate:1} {Document review of labs and clinical decision tools ie heart score, Chads2Vasc2 etc:1}  {Document your independent review of radiology images, and any outside records:1} {Document your discussion with family members, caretakers, and with consultants:1} {Document social determinants of health affecting pt's care:1} {Document your decision making why or why not admission, treatments were needed:1} Final Clinical  Impression(s) / ED Diagnoses Final diagnoses:  None    Rx / DC Orders ED Discharge Orders     None

## 2022-06-11 NOTE — ED Triage Notes (Signed)
Pt bib [facility] from home. Pt c/o [facility]. Upon ems arrival pt breathing was labored. Pt wife reports pt breathing efforts increasing over the past week. HX of small cell cancer

## 2022-06-12 ENCOUNTER — Encounter: Payer: Self-pay | Admitting: Internal Medicine

## 2022-06-12 ENCOUNTER — Telehealth: Payer: Self-pay

## 2022-06-12 ENCOUNTER — Emergency Department (HOSPITAL_COMMUNITY): Payer: Medicare Other

## 2022-06-12 DIAGNOSIS — J9600 Acute respiratory failure, unspecified whether with hypoxia or hypercapnia: Secondary | ICD-10-CM | POA: Diagnosis not present

## 2022-06-12 DIAGNOSIS — T451X5A Adverse effect of antineoplastic and immunosuppressive drugs, initial encounter: Secondary | ICD-10-CM

## 2022-06-12 DIAGNOSIS — Z515 Encounter for palliative care: Secondary | ICD-10-CM | POA: Diagnosis not present

## 2022-06-12 DIAGNOSIS — Z7189 Other specified counseling: Secondary | ICD-10-CM

## 2022-06-12 DIAGNOSIS — F319 Bipolar disorder, unspecified: Secondary | ICD-10-CM

## 2022-06-12 DIAGNOSIS — Z66 Do not resuscitate: Secondary | ICD-10-CM | POA: Diagnosis not present

## 2022-06-12 DIAGNOSIS — D61818 Other pancytopenia: Secondary | ICD-10-CM

## 2022-06-12 DIAGNOSIS — E876 Hypokalemia: Secondary | ICD-10-CM

## 2022-06-12 DIAGNOSIS — R6510 Systemic inflammatory response syndrome (SIRS) of non-infectious origin without acute organ dysfunction: Secondary | ICD-10-CM

## 2022-06-12 DIAGNOSIS — D709 Neutropenia, unspecified: Secondary | ICD-10-CM

## 2022-06-12 DIAGNOSIS — C3490 Malignant neoplasm of unspecified part of unspecified bronchus or lung: Secondary | ICD-10-CM

## 2022-06-12 DIAGNOSIS — C3412 Malignant neoplasm of upper lobe, left bronchus or lung: Secondary | ICD-10-CM

## 2022-06-12 DIAGNOSIS — D701 Agranulocytosis secondary to cancer chemotherapy: Secondary | ICD-10-CM

## 2022-06-12 DIAGNOSIS — I10 Essential (primary) hypertension: Secondary | ICD-10-CM

## 2022-06-12 LAB — BLOOD CULTURE ID PANEL (REFLEXED) - BCID2

## 2022-06-12 LAB — CBC WITH DIFFERENTIAL/PLATELET
Abs Immature Granulocytes: 0.01 10*3/uL (ref 0.00–0.07)
Abs Immature Granulocytes: 0 10*3/uL (ref 0.00–0.07)
Basophils Absolute: 0 10*3/uL (ref 0.0–0.1)
Basophils Absolute: 0 10*3/uL (ref 0.0–0.1)
Basophils Relative: 0 %
Basophils Relative: 0 %
Eosinophils Absolute: 0 10*3/uL (ref 0.0–0.5)
Eosinophils Absolute: 0 10*3/uL (ref 0.0–0.5)
Eosinophils Relative: 3 %
Eosinophils Relative: 0 %
HCT: 29 % — ABNORMAL LOW (ref 39.0–52.0)
HCT: 27.2 % — ABNORMAL LOW (ref 39.0–52.0)
Hemoglobin: 10.6 g/dL — ABNORMAL LOW (ref 13.0–17.0)
Hemoglobin: 9.6 g/dL — ABNORMAL LOW (ref 13.0–17.0)
Immature Granulocytes: 1 %
Immature Granulocytes: 0 %
Lymphocytes Relative: 65 %
Lymphocytes Relative: 52 %
Lymphs Abs: 0.9 10*3/uL (ref 0.7–4.0)
Lymphs Abs: 0.2 10*3/uL — ABNORMAL LOW (ref 0.7–4.0)
MCH: 33.8 pg (ref 26.0–34.0)
MCH: 33.6 pg (ref 26.0–34.0)
MCHC: 36.6 g/dL — ABNORMAL HIGH (ref 30.0–36.0)
MCHC: 35.3 g/dL (ref 30.0–36.0)
MCV: 92.4 fL (ref 80.0–100.0)
MCV: 95.1 fL (ref 80.0–100.0)
Monocytes Absolute: 0.3 10*3/uL (ref 0.1–1.0)
Monocytes Absolute: 0 10*3/uL — ABNORMAL LOW (ref 0.1–1.0)
Monocytes Relative: 18 %
Monocytes Relative: 5 %
Neutro Abs: 0.2 10*3/uL — CL (ref 1.7–7.7)
Neutro Abs: 0.2 10*3/uL — CL (ref 1.7–7.7)
Neutrophils Relative %: 13 %
Neutrophils Relative %: 43 %
Platelets: 63 10*3/uL — ABNORMAL LOW (ref 150–400)
Platelets: 53 10*3/uL — ABNORMAL LOW (ref 150–400)
RBC: 3.14 MIL/uL — ABNORMAL LOW (ref 4.22–5.81)
RBC: 2.86 MIL/uL — ABNORMAL LOW (ref 4.22–5.81)
RDW: 13.7 % (ref 11.5–15.5)
RDW: 13.5 % (ref 11.5–15.5)
Smear Review: DECREASED
Smear Review: DECREASED
WBC: 1.4 10*3/uL — CL (ref 4.0–10.5)
WBC: 0.4 10*3/uL — CL (ref 4.0–10.5)
nRBC: 0 % (ref 0.0–0.2)
nRBC: 0 % (ref 0.0–0.2)

## 2022-06-12 LAB — URINALYSIS, ROUTINE W REFLEX MICROSCOPIC
Bacteria, UA: NONE SEEN
Bilirubin Urine: NEGATIVE
Glucose, UA: NEGATIVE mg/dL
Hgb urine dipstick: NEGATIVE
Ketones, ur: NEGATIVE mg/dL
Leukocytes,Ua: NEGATIVE
Nitrite: NEGATIVE
Protein, ur: 30 mg/dL — AB
Specific Gravity, Urine: 1.028 (ref 1.005–1.030)
pH: 5 (ref 5.0–8.0)

## 2022-06-12 LAB — COMPREHENSIVE METABOLIC PANEL
ALT: 37 U/L (ref 0–44)
AST: 30 U/L (ref 15–41)
Albumin: 4.3 g/dL (ref 3.5–5.0)
Alkaline Phosphatase: 58 U/L (ref 38–126)
Anion gap: 7 (ref 5–15)
BUN: 25 mg/dL — ABNORMAL HIGH (ref 8–23)
CO2: 23 mmol/L (ref 22–32)
Calcium: 9.3 mg/dL (ref 8.9–10.3)
Chloride: 108 mmol/L (ref 98–111)
Creatinine, Ser: 1.14 mg/dL (ref 0.61–1.24)
GFR, Estimated: >60 mL/min (ref >60)
Glucose, Bld: 132 mg/dL — ABNORMAL HIGH (ref 70–99)
Potassium: 3.4 mmol/L — ABNORMAL LOW (ref 3.5–5.1)
Sodium: 138 mmol/L (ref 135–145)
Total Bilirubin: 1.4 mg/dL — ABNORMAL HIGH (ref 0.3–1.2)
Total Protein: 7.4 g/dL (ref 6.5–8.1)

## 2022-06-12 LAB — PROCALCITONIN: Procalcitonin: <0.1 ng/mL

## 2022-06-12 LAB — BASIC METABOLIC PANEL
Anion gap: 7 (ref 5–15)
BUN: 24 mg/dL — ABNORMAL HIGH (ref 8–23)
CO2: 19 mmol/L — ABNORMAL LOW (ref 22–32)
Calcium: 8.6 mg/dL — ABNORMAL LOW (ref 8.9–10.3)
Chloride: 112 mmol/L — ABNORMAL HIGH (ref 98–111)
Creatinine, Ser: 1.03 mg/dL (ref 0.61–1.24)
GFR, Estimated: >60 mL/min (ref >60)
Glucose, Bld: 174 mg/dL — ABNORMAL HIGH (ref 70–99)
Potassium: 3.4 mmol/L — ABNORMAL LOW (ref 3.5–5.1)
Sodium: 138 mmol/L (ref 135–145)

## 2022-06-12 LAB — RESP PANEL BY RT-PCR (FLU A&B, COVID) ARPGX2
Influenza A by PCR: NEGATIVE
Influenza B by PCR: NEGATIVE
SARS Coronavirus 2 by RT PCR: NEGATIVE

## 2022-06-12 LAB — TROPONIN I (HIGH SENSITIVITY)
Troponin I (High Sensitivity): 4 ng/L (ref <18)
Troponin I (High Sensitivity): 3 ng/L (ref <18)

## 2022-06-12 LAB — MRSA NEXT GEN BY PCR, NASAL: MRSA by PCR Next Gen: NOT DETECTED

## 2022-06-12 LAB — VALPROIC ACID LEVEL: Valproic Acid Lvl: <10 ug/mL — ABNORMAL LOW (ref 50.0–100.0)

## 2022-06-12 LAB — LACTIC ACID, PLASMA: Lactic Acid, Venous: 1.4 mmol/L (ref 0.5–1.9)

## 2022-06-12 LAB — BRAIN NATRIURETIC PEPTIDE: B Natriuretic Peptide: 23 pg/mL (ref 0.0–100.0)

## 2022-06-12 MED ORDER — VANCOMYCIN HCL 2000 MG/400ML IV SOLN
2000.0000 mg | Freq: Once | INTRAVENOUS | Status: AC
  Administered 2022-06-12: 2000 mg via INTRAVENOUS
  Filled 2022-06-12: qty 400

## 2022-06-12 MED ORDER — IPRATROPIUM-ALBUTEROL 0.5-2.5 (3) MG/3ML IN SOLN: 3.0000 mL | Freq: Four times a day (QID) | RESPIRATORY_TRACT | Status: DC | PRN

## 2022-06-12 MED ORDER — HEPARIN SOD (PORK) LOCK FLUSH 100 UNIT/ML IV SOLN
INTRAVENOUS | Status: AC
  Administered 2022-06-12: 500 [IU]
  Filled 2022-06-12: qty 5

## 2022-06-12 MED ORDER — ACETAMINOPHEN 325 MG PO TABS: 650.0000 mg | ORAL_TABLET | Freq: Four times a day (QID) | ORAL | Status: DC | PRN

## 2022-06-12 MED ORDER — IOHEXOL 350 MG/ML SOLN
100.0000 mL | Freq: Once | INTRAVENOUS | Status: AC | PRN
  Administered 2022-06-12: 100 mL via INTRAVENOUS

## 2022-06-12 MED ORDER — POTASSIUM CHLORIDE 20 MEQ PO PACK
40.0000 meq | PACK | Freq: Once | ORAL | Status: AC
  Administered 2022-06-12: 40 meq via ORAL
  Filled 2022-06-12: qty 2

## 2022-06-12 MED ORDER — SODIUM CHLORIDE 0.9 % IV SOLN
2.0000 g | Freq: Three times a day (TID) | INTRAVENOUS | Status: DC
  Administered 2022-06-12: 2 g via INTRAVENOUS
  Filled 2022-06-12: qty 12.5

## 2022-06-12 MED ORDER — SODIUM CHLORIDE (PF) 0.9 % IJ SOLN
INTRAMUSCULAR | Status: AC
  Filled 2022-06-12: qty 50

## 2022-06-12 MED ORDER — SODIUM CHLORIDE 0.9 % IV SOLN
2.0000 g | Freq: Once | INTRAVENOUS | Status: AC
  Administered 2022-06-12: 2 g via INTRAVENOUS
  Filled 2022-06-12: qty 12.5

## 2022-06-12 MED ORDER — ACETAMINOPHEN 650 MG RE SUPP: 650.0000 mg | Freq: Four times a day (QID) | RECTAL | Status: DC | PRN

## 2022-06-12 MED ORDER — VANCOMYCIN HCL 2000 MG/400ML IV SOLN: 2000.0000 mg | INTRAVENOUS | Status: DC

## 2022-06-12 MED ORDER — LEVOFLOXACIN 750 MG PO TABS: 750.0000 mg | ORAL_TABLET | Freq: Every day | ORAL | 0 refills | Status: AC

## 2022-06-12 NOTE — Consult Note (Signed)
Palliative Care Consult Note                                  Date: 06/12/2022   Patient Name: Austin Wells  DOB: [birth year]  MRN: [id number]  Age / Sex: 76 y.o., male  PCP: Curt Bears, MD Referring Physician: [place] Lamar Naef J, DO  Reason for Consultation: Establishing goals of care  HPI/Patient Profile: 76 y.o. male  with past medical history of extensive stage (T1b, N3, M1 a) small cell lung cancer on palliative chemotherapy, active smoker, COPD, bipolar disorder, depression, GERD, hyperlipidemia, hypertension, hypothyroidism, OSA presents to the ED via EMS for evaluation of respiratory distress. Evaluation showed tachypneic to the 40s with scattered wheezing and rhonchi, significant interval increase in the size of left hilar mass/adenopathy with encasement of the left pulmonary artery and left upper and hilar bronchi as well as invasion into the left lower lobe pulmonary artery branch.  A 7 x 21 mm left lower lobe subpleural nodule similar or minimally increased since the prior CT.   He was admitted on 06/11/2022 with acute respiratory failure, stave IV small cell lung CA, pancytopenia, and others.  PMT was consulted for [place] conversations.   Past Medical History:  Diagnosis Date   Bipolar disorder ([place])    Depression    GERD (gastroesophageal reflux disease)    Hyperlipidemia    Hypertension    Orthostatic hypotension    Sleep apnea    has a cpap and doesn't use it   Syncope and collapse    Tobacco use     Subjective:   This NP Walden Field reviewed medical records, received report from team, assessed the patient and then meet at the patient's bedside to discuss diagnosis, prognosis, GOC, EOL wishes disposition and options.  I met with the patient in the ED. No family was present at the bedside.   Concept of Palliative Care was introduced as specialized medical care for people and their families living with serious illness.   If focuses on providing relief from the symptoms and stress of a serious illness.  The goal is to improve quality of life for both the patient and the family. Values and goals of care important to patient and family were attempted to be elicited.  Created space and opportunity for patient  and family to explore thoughts and feelings regarding current medical situation   Natural trajectory and current clinical status were discussed. Questions and concerns addressed. Patient  encouraged to call with questions or concerns.    Patient/Family Understanding of Illness: He understands he came in with shortness of breath which is much better now. He admits this is ongoing for him related to lung cancer. He sees Dr. Julien Nordmann, last saw him a couple weeks ago. He has planned follow-up soon with him.  Life Review: Deferred  Patient Values: Deferred  Goals: Going home, follow-up with oncology for more treatment options  Today's Discussion: We also talked about his lung cancer and how it is causing him shortness of breath. We discussed that the tradjectory of illness related to his cancer. He is hopeful for continued treatment. He is waiting for his wife to come get him so he can go home.   When discussing [place], he states he has a living will but doesn't know what's in it. Goal is to d/c home shortly.  I provided emotional and general support through therapeutic listening, empathy, sharing of  stories, and other techniques. I answered all questions and addressed all concerns to the best of my ability.  We discussed referral to outpatient palliative care at the [facility] clinic as a means of continued support, cont'd GOC conversations as his disease progresses. He agrees to referral being placed.  Later review of ACP tab found a living will which spells out no heroics if he has a terminal illness. Subsequently he is a DNR here.  Review of Systems  Respiratory:  Positive for  shortness of breath (improved).   Gastrointestinal:  Negative for abdominal pain, nausea and vomiting.    Objective:   Primary Diagnoses: Present on Admission:  Acute respiratory failure ([place])  Bipolar I disorder ([place])  Hypertension   Physical Exam Vitals and nursing note reviewed.  Constitutional:      General: He is not in acute distress.    Appearance: He is ill-appearing.  HENT:     Head: Normocephalic and atraumatic.  Cardiovascular:     Rate and Rhythm: Normal rate.  Pulmonary:     Effort: Pulmonary effort is normal. No respiratory distress.     Comments: On room air Skin:    General: Skin is warm and dry.  Neurological:     General: No focal deficit present.     Mental Status: He is alert.  Psychiatric:        Mood and Affect: Mood normal.        Behavior: Behavior normal.     Vital Signs:  BP 130/80 (BP Location: Right Arm)   Pulse (!) 107   Temp 97.9 F (36.6 C) (Oral)   Resp 20   SpO2 98%   Palliative Assessment/Data: 50-60%    Advanced Care Planning:   Primary Decision Maker: PATIENT  Code Status/Advance Care Planning: DNR  A discussion was had today regarding advanced directives. Concepts specific to code status, artifical feeding and hydration, continued IV antibiotics and rehospitalization was had.  The difference between a aggressive medical intervention path and a palliative comfort care path for this patient at this time was had.   Decisions/Changes to ACP: None today  Assessment & Plan:   Impression: 76 year old male with multiple acute and chronic issues as described above. He came in for respiratory distress but rapidly improved and subsequently is scheduled for d/c to home. He does have lung cancer and imaging suggests progression. He is wanting oncology follow-up to discuss further treatment options. He has agreed to OP Palliative care and a referral has been placed. Long term prognosis poor.  SUMMARY OF RECOMMENDATIONS   Remain  DNR Plan OP Palliative care in the symptom management clinic PMT will continue to follow if d/c order is d/c'd  Symptom Management:  Per primary team PMT is available to asist as needed  Prognosis:  Unable to determine  Discharge Planning:  Home    Discussed with: Patient, medical team, nursing team    Thank you for allowing [place] to participate in the care of [name] PMT will continue to support holistically.  Billing based on MDM: High  Problems Addressed: One or more chronic illnesses with severe exacerbation, progression, or side effects of treatment.  Amount and/or Complexity of Data: Category 1:Review of prior external note(s) from each unique source and Review of the result(s) of each unique test and Category 3:Discussion of management or test interpretation with external physician/other qualified health care professional/appropriate source (not separately reported) (Reviewed outpatient oncology notes; Chest XRay and CT,  CBC for WBC count, H/H as well as BMP for kidney function and electrolytes)  Risks: Decision not to resuscitate or to de-escalate care because of poor prognosis (Confirmed DNR with patient and on file living will)   Signed by: Walden Field, NP Palliative Medicine Team  Team Phone # [phone] (Nights/Weekends)  06/12/2022, 10:59 AM

## 2022-06-12 NOTE — Progress Notes (Signed)
A consult was received from an ED physician for Cefepime + Vancomycin per pharmacy dosing.  The patient's profile has been reviewed for ht/wt/allergies/indication/available labs.   A one time order has been placed for Cefepime 2gm + Vancomycin 2gm IV.  Further antibiotics/pharmacy consults should be ordered by admitting physician if indicated.                       Thank you, Netta Cedars PharmD 06/12/2022  12:18 AM

## 2022-06-12 NOTE — Discharge Summary (Signed)
Physician Discharge Summary  Austin Wells ZOX:[id number] DOB: 1946/06/01 DOA: 06/11/2022  PCP: Curt Bears, MD  Admit date: 06/11/2022 Discharge date: 06/12/2022  Admitted From: Home Disposition: Home  Recommendations for Outpatient Follow-up:  Follow up with PCP in 1-2 weeks Follow-up with medical oncology within 1 week for concern regarding significant progression of lung cancer; as this is the ultimate cause of his presenting symptoms Continue antibiotics with Levaquin on discharge for treatment of pneumonia Would benefit from outpatient palliative care and even hospice to follow given his overall poor prognosis  Home Health: No Equipment/Devices: None  Discharge Condition: Stable [name], but overall prognosis very poor/grim CODE STATUS: DNR Diet recommendation:   History of present illness:  Austin Wells is a 76 y.o. male with medical history significant of extensive stage (T1b, N3, M1 a) small cell lung cancer on palliative chemotherapy, active smoker, COPD, bipolar disorder, depression, GERD, hyperlipidemia, hypertension, hypothyroidism, OSA presents to the ED via EMS for evaluation of respiratory distress.  Patient reports 1 week history of progressively worsening shortness of breath.  He is coughing.  Denies fevers or chest pain.  He is using his home inhalers.  No other complaints.  Denies nausea, vomiting, abdominal pain, urinary symptoms, hematemesis, hematochezia, or any bleeding. Upon EMS arrival patient noted to be tachypneic to the 40s with scattered wheezing and rhonchi.  He was given DuoNeb treatment and Solu-Medrol with some improvement.  In the ED, temperature 97.5 F, HR 100, RR 20, BP 126/89, SPO2 100% on room air.  Labs showing WBC 1.4 (ANC 0.2), hemoglobin 10.6, MCV 92.4, platelet count 63k, potassium 3.4, BUN 25, creatinine 1.1, T. bili 1.4 with normal remainder of LFTs, blood cultures drawn, lactic acid normal, valproic acid level <10, BNP normal, troponin  negative x2, COVID and influenza PCR negative, UA and urine culture pending.  CTA chest negative for PE.  Showing significant interval increase in the size of left hilar mass/adenopathy with encasement of the left pulmonary artery and left upper and hilar bronchi as well as invasion into the left lower lobe pulmonary artery branch.  A 7 x 21 mm left lower lobe subpleural nodule similar or minimally increased since the prior CT. Patient was given DuoNeb treatment, vancomycin, and cefepime in the ED. [facility] was consulted for admission.  Hospital course:  Acute respiratory failure Patient presenting to the ED with progressive shortness of breath.  Found to be tachypneic by EMS.  CTA chest negative for PE, showing significant interval increase in the size of left hilar mass/adenopathy with encasement of the left pulmonary artery and left upper and hilar bronchi as well as invasion into the left lower lobe pulmonary artery branch. Likely secondary to progressive lung cancer. Patient was never hypoxic after arrival in the ED and feels well and would like to discharge home.  Discussed with patient that likely symptoms related to his progressive lung cancer and needs close follow-up with his medical oncologist.  Given his neutropenia, we will continue antibiotics there initiated in the ED with Levaquin x7 days outpatient.  Given his overall poor prognosis with significant disease progression, would recommend transitioning to a more palliative approach with hospice.    Stage IV small cell lung cancer Patient is currently on palliative chemotherapy and CT done today concerning for significant progression of disease.  Outpatient follow-up with oncology, Dr. Julien Nordmann.   Pancytopenia Likely due to chemotherapy.  Repeat labs showing WBC 0.4 (ANC 0.2), hemoglobin 9.6, platelet count 53k.  No signs of bleeding.  Discharging on  Levaquin.  Recommend repeat CBC 1 week.  Outpatient follow-up with cardiology.    Neutropenia Tachycardia and tachypnea have improved.  Patient is afebrile and no obvious infectious etiology at this time.  No lactic acidosis.  COVID and influenza PCR negative.  Chest imaging not suggestive of pneumonia.  UA without signs of infection.  No meningeal signs.  Started on broad-spectrum antibiotics with vancomycin and cefepime.  Procalcitonin negative.  Discharging on Levaquin x7 days.  Outpatient follow-up with medical oncology.   Mild hypokalemia Repleted during hospitalization.   Bipolar disorder Continue Depakote 5 mg p.o. daily  Depression Continue venlafaxine 75 mg p.o. daily and Abilify 5 mg p.o. daily  Hyperlipidemia Continue Crestor 40 mg p.o. daily  Hypertension Continue lisinopril 40 mg p.o. daily and HCTZ 12.5 mg p.o. daily  Hypothyroidism Continue levothyroxine 75 mcg p.o. daily    Discharge Diagnoses:  Principal Problem:   Acute respiratory failure (HCC) Active Problems:   Bipolar I disorder (HCC)   Hypertension   Small cell lung cancer (HCC)   Pancytopenia (HCC)   Neutropenia (HCC)   SIRS (systemic inflammatory response syndrome) ([place])   Hypokalemia    Discharge Instructions  Discharge Instructions     Call MD for:  difficulty breathing, headache or visual disturbances   Complete by: As directed    Call MD for:  extreme fatigue   Complete by: As directed    Call MD for:  persistant dizziness or light-headedness   Complete by: As directed    Call MD for:  persistant nausea and vomiting   Complete by: As directed    Call MD for:  severe uncontrolled pain   Complete by: As directed    Call MD for:  temperature >100.4   Complete by: As directed    Diet - low sodium heart healthy   Complete by: As directed    Increase activity slowly   Complete by: As directed       Allergies as of 06/12/2022   No Known Allergies      Medication List     TAKE these medications    ARIPiprazole 5 MG tablet Commonly known as: ABILIFY Take  5 mg by mouth daily. For mood stabilization and depression   calcium-vitamin D 500-200 MG-UNIT tablet Commonly known as: OSCAL WITH D Take 1 tablet by mouth daily.   cyanocobalamin 500 MCG tablet Commonly known as: VITAMIN B12 Take 500 mcg by mouth daily. Vitamin B12   divalproex 500 MG DR tablet Commonly known as: DEPAKOTE Take by mouth.   folic acid 1 MG tablet Commonly known as: FOLVITE Take 1 tablet by mouth daily.   hydrochlorothiazide 25 MG tablet Commonly known as: HYDRODIURIL Take 12.5 mg by mouth daily.   levofloxacin 750 MG tablet Commonly known as: Levaquin Take 1 tablet (750 mg total) by mouth daily for 7 days.   levothyroxine 75 MCG tablet Commonly known as: SYNTHROID Take 1 tablet by mouth daily(morning) on empty stomach   lidocaine-prilocaine cream Commonly known as: EMLA Apply to the Port-A-Cath site 30-60-minute before chemotherapy   lisinopril 40 MG tablet Commonly known as: ZESTRIL Take 1 tablet (40 mg total) by mouth daily. For hypertension. What changed: additional instructions   MELATONIN PO Take 1 tablet by mouth at bedtime.   prochlorperazine 10 MG tablet Commonly known as: COMPAZINE Take 1 tablet (10 mg total) by mouth every 6 (six) hours as needed for nausea or vomiting.   propranolol 10 MG tablet Commonly known as: INDERAL Take  10 mg by mouth 2 (two) times daily.   rosuvastatin 40 MG tablet Commonly known as: CRESTOR Take 40 mg by mouth daily.   Tiotropium Bromide-Olodaterol 2.5-2.5 MCG/ACT Aers INHALE 2 PUFFS BY MOUTH DAILY   traZODone 150 MG tablet Commonly known as: DESYREL Take 150 mg by mouth at bedtime as needed for sleep.   venlafaxine XR 75 MG 24 hr capsule Commonly known as: EFFEXOR-XR Take 75 mg by mouth daily with breakfast.        Follow-up Information     Curt Bears, MD. Schedule an appointment as soon as possible for a visit in 1 week(s).   Specialty: Oncology Contact information: [place] [address] [phone]                No Known Allergies  Consultations: Discussed with palliative care who recommended outpatient palliative on discharge   Procedures/Studies: CT Angio Chest PE W and/or Wo Contrast  Result Date: 06/12/2022 CLINICAL DATA:  Concern for pulmonary embolism. Small cell lung cancer. EXAM: CT ANGIOGRAPHY CHEST WITH CONTRAST TECHNIQUE: Multidetector CT imaging of the chest was performed using the standard protocol during bolus administration of intravenous contrast. Multiplanar CT image reconstructions and MIPs were obtained to evaluate the vascular anatomy. RADIATION DOSE REDUCTION: This exam was performed according to the departmental dose-optimization program which includes automated exposure control, adjustment of the mA and/or kV according to patient size and/or use of iterative reconstruction technique. CONTRAST:  135mL OMNIPAQUE IOHEXOL 350 MG/ML SOLN COMPARISON:  CT of the chest abdomen pelvis dated 04/01/2022. FINDINGS: Cardiovascular: There is no cardiomegaly or pericardial effusion. Mild atherosclerotic calcification of the thoracic aorta. No aneurysmal dilatation or dissection. Soft tissue density within the left lower lobe pulmonary artery branch (125/5) likely represents intravascular invasion of the left hilar mass/adenopathy. No pulmonary artery embolus identified. Right-sided Port-A-Cath with tip at the cavoatrial junction. Mediastinum/Nodes: Left hilar mass measures approximately 4.5 x 9.0 cm in greatest axial dimensions. There is encasement of the left main pulmonary artery and left upper and hilar bronchi. There is invasion into the left lower lobe pulmonary artery branch (125/5). Overall significant interval increase in the size of the left hilar mass/adenopathy compared to prior CT. Subcarinal lymph nodes measure 12 mm short axis. Right hilar adenopathy measures 17 mm in short axis, new or progressed since the prior CT.  Several mildly enlarged and rounded lymph nodes adjacent to the esophagus. Lungs/Pleura: Diffuse interstitial coarsening and subpleural reticulation. Left lower lobe subpleural nodule measuring 7 x 21 mm (85/6). No pleural effusion or pneumothorax. The central airways are patent. Upper Abdomen: Gallstone. Musculoskeletal: Degenerative changes of the spine. No acute osseous pathology. Review of the MIP images confirms the above findings. IMPRESSION: 1. No CT evidence of pulmonary artery embolus. 2. Significant interval increase in the size of the left hilar mass/adenopathy compared to the prior CT. There is encasement of the left main pulmonary artery and left upper and hilar bronchi as well as invasion into the left lower lobe pulmonary artery branch. 3. A 7 x 21 mm left lower lobe subpleural nodule similar or minimally increased since the prior CT. 4. Cholelithiasis. 5. Aortic Atherosclerosis (ICD10-I70.0). Electronically Signed   By: Anner Crete M.D.   On: 06/12/2022 01:35   DG Chest Port 1 View  Result Date: 06/12/2022 CLINICAL DATA:  Sepsis, shortness of breath, cancer patient EXAM: PORTABLE CHEST 1 VIEW COMPARISON:  08/30/2021.  Chest CT 04/01/2022. FINDINGS: Right Port-A-Cath in place with the tip in  the SVC. Cardiomegaly. Increased markings in the lungs bilaterally, left greater than right, compatible with chronic lung disease/fibrosis. This is unchanged since prior studies. No effusions or acute confluent opacities. No acute bony abnormality. IMPRESSION: Stable chronic changes.  No active disease. Electronically Signed   By: Rolm Baptise M.D.   On: 06/12/2022 00:11     Subjective: Patient seen and examined at bedside, resting comfortably.  Sleeping but easy arousable.  States breathing feels back to his typical baseline and requesting discharge home this morning.  No other specific complaints or concerns at this time.  Discussed with patient that his presenting symptoms likely related to his  significant cancer progression unfortunately needs close follow-up with his oncologist.  Discussed with palliative care who recommended outpatient palliative service to follow on discharge.  Overall very poor prognosis and would likely benefit from a more comfort approach/hospice but patient would like to talk with his oncologist prior to making a decision.  No other questions.  Denies headache, no dizziness, no chest pain, no palpitations, no current shortness of breath than his typical baseline, no fever/chills/night sweats, no nausea/vomiting/diarrhea, no focal weakness, no cough/congestion, no abdominal pain, no paresthesias.  No acute events overnight per nursing staff.  Discharge Exam: Vitals:   06/12/22 0803 06/12/22 0804  BP:  (!) 127/102  Pulse: 93 92  Resp: 19 19  Temp:  97.9 F (36.6 C)  SpO2: 99% 98%   Vitals:   06/12/22 0500 06/12/22 0630 06/12/22 0803 06/12/22 0804  BP: 116/80   (!) 127/102  Pulse: 92  93 92  Resp: 18  19 19   Temp:  (!) 97.5 F (36.4 C)  97.9 F (36.6 C)  TempSrc:    Oral  SpO2: 98%  99% 98%    Physical Exam: GEN: NAD, alert and oriented x 3, chronically ill/cachectic in appearance HEENT: NCAT, PERRL, EOMI, sclera clear, MMM PULM: Breath sounds slight diminished bilateral bases, no wheezing/crackles, normal respiratory effort without accessory muscle use, on room air with SPO2 100% at rest CV: RRR w/o M/G/R GI: abd soft, NTND, NABS, no R/G/M MSK: no peripheral edema, moves all extremities independently NEURO: CN II-XII intact, no focal deficits, sensation to light touch intact PSYCH: normal mood/affect Integumentary: dry/intact, no rashes or wounds    The results of significant diagnostics from this hospitalization (including imaging, microbiology, ancillary and laboratory) are listed below for reference.     Microbiology: Recent Results (from the past 240 hour(s))  Resp Panel by RT-PCR (Flu A&B, Covid) Anterior Nasal Swab     Status: None    Collection Time: 06/11/22 11:13 PM   Specimen: Anterior Nasal Swab  Result Value Ref Range Status   SARS Coronavirus 2 by RT PCR NEGATIVE NEGATIVE Final    Comment: (NOTE) SARS-CoV-2 target nucleic acids are NOT DETECTED.  The SARS-CoV-2 RNA is generally detectable in upper respiratory specimens during the acute phase of infection. The lowest concentration of SARS-CoV-2 viral copies this assay can detect is 138 copies/mL. A negative result does not preclude SARS-Cov-2 infection and should not be used as the sole basis for treatment or other patient management decisions. A negative result may occur with  improper specimen collection/handling, submission of specimen other than nasopharyngeal swab, presence of viral mutation(s) within the areas targeted by this assay, and inadequate number of viral copies(<138 copies/mL). A negative result must be combined with clinical observations, patient history, and epidemiological information. The expected result is Negative.  Fact Sheet for Patients:  EntrepreneurPulse.com.au  Fact  Sheet for Healthcare Providers:  IncredibleEmployment.be  This test is no t yet approved or cleared by the [place] FDA and  has been authorized for detection and/or diagnosis of SARS-CoV-2 by FDA under an Emergency Use Authorization (EUA). This EUA will remain  in effect (meaning this test can be used) for the duration of the COVID-19 declaration under Section 564(b)(1) of the Act, 21 U.S.C.section 360bbb-3(b)(1), unless the authorization is terminated  or revoked sooner.       Influenza A by PCR NEGATIVE NEGATIVE Final   Influenza B by PCR NEGATIVE NEGATIVE Final    Comment: (NOTE) The Xpert Xpress SARS-CoV-2/FLU/RSV plus assay is intended as an aid in the diagnosis of influenza from Nasopharyngeal swab specimens and should not be used as a sole basis for treatment. Nasal washings and aspirates are unacceptable for  Xpert Xpress SARS-CoV-2/FLU/RSV testing.  Fact Sheet for Patients: EntrepreneurPulse.com.au  Fact Sheet for Healthcare Providers: IncredibleEmployment.be  This test is not yet approved or cleared by the [place] FDA and has been authorized for detection and/or diagnosis of SARS-CoV-2 by FDA under an Emergency Use Authorization (EUA). This EUA will remain in effect (meaning this test can be used) for the duration of the COVID-19 declaration under Section 564(b)(1) of the Act, 21 U.S.C. section 360bbb-3(b)(1), unless the authorization is terminated or revoked.  Performed at [facility], [place] [address] [place], [place] [address]      Labs: BNP (last 3 results) Recent Labs    06/11/22 2312  BNP 84.1   Basic Metabolic Panel: Recent Labs  Lab 06/11/22 2312 06/12/22 0455  NA 138 138  K 3.4* 3.4*  CL 108 112*  CO2 23 19*  GLUCOSE 132* 174*  BUN 25* 24*  CREATININE 1.14 1.03  CALCIUM 9.3 8.6*   Liver Function Tests: Recent Labs  Lab 06/11/22 2312  AST 30  ALT 37  ALKPHOS 58  BILITOT 1.4*  PROT 7.4  ALBUMIN 4.3   No results for input(s): "LIPASE", "AMYLASE" in the last 168 hours. No results for input(s): "AMMONIA" in the last 168 hours. CBC: Recent Labs  Lab 06/11/22 2312 06/12/22 0455  WBC 1.4* 0.4*  NEUTROABS 0.2* 0.2*  HGB 10.6* 9.6*  HCT 29.0* 27.2*  MCV 92.4 95.1  PLT 63* 53*   Cardiac Enzymes: No results for input(s): "CKTOTAL", "CKMB", "CKMBINDEX", "TROPONINI" in the last 168 hours. BNP: Invalid input(s): "POCBNP" CBG: No results for input(s): "GLUCAP" in the last 168 hours. D-Dimer No results for input(s): "DDIMER" in the last 72 hours. Hgb A1c No results for input(s): "HGBA1C" in the last 72 hours. Lipid Profile No results for input(s): "CHOL", "HDL", "LDLCALC", "TRIG", "CHOLHDL", "LDLDIRECT" in the last 72 hours. Thyroid function studies No results for input(s): "TSH",  "T4TOTAL", "T3FREE", "THYROIDAB" in the last 72 hours.  Invalid input(s): "FREET3" Anemia work up No results for input(s): "VITAMINB12", "FOLATE", "FERRITIN", "TIBC", "IRON", "RETICCTPCT" in the last 72 hours. Urinalysis    Component Value Date/Time   COLORURINE YELLOW 06/12/2022 [place] 06/12/2022 0137   LABSPEC 1.028 06/12/2022 0137   PHURINE 5.0 06/12/2022 0137   GLUCOSEU NEGATIVE 06/12/2022 0137   HGBUR NEGATIVE 06/12/2022 0137   BILIRUBINUR NEGATIVE 06/12/2022 0137   KETONESUR NEGATIVE 06/12/2022 0137   PROTEINUR 30 (A) 06/12/2022 0137   UROBILINOGEN 1.0 06/13/2013 1408   NITRITE NEGATIVE 06/12/2022 0137   LEUKOCYTESUR NEGATIVE 06/12/2022 0137   Sepsis Labs Recent Labs  Lab 06/11/22 2312 06/12/22 0455  WBC 1.4* 0.4*  Microbiology Recent Results (from the past 240 hour(s))  Resp Panel by RT-PCR (Flu A&B, Covid) Anterior Nasal Swab     Status: None   Collection Time: 06/11/22 11:13 PM   Specimen: Anterior Nasal Swab  Result Value Ref Range Status   SARS Coronavirus 2 by RT PCR NEGATIVE NEGATIVE Final    Comment: (NOTE) SARS-CoV-2 target nucleic acids are NOT DETECTED.  The SARS-CoV-2 RNA is generally detectable in upper respiratory specimens during the acute phase of infection. The lowest concentration of SARS-CoV-2 viral copies this assay can detect is 138 copies/mL. A negative result does not preclude SARS-Cov-2 infection and should not be used as the sole basis for treatment or other patient management decisions. A negative result may occur with  improper specimen collection/handling, submission of specimen other than nasopharyngeal swab, presence of viral mutation(s) within the areas targeted by this assay, and inadequate number of viral copies(<138 copies/mL). A negative result must be combined with clinical observations, patient history, and epidemiological information. The expected result is Negative.  Fact Sheet for Patients:   EntrepreneurPulse.com.au  Fact Sheet for Healthcare Providers:  IncredibleEmployment.be  This test is no t yet approved or cleared by the [place] FDA and  has been authorized for detection and/or diagnosis of SARS-CoV-2 by FDA under an Emergency Use Authorization (EUA). This EUA will remain  in effect (meaning this test can be used) for the duration of the COVID-19 declaration under Section 564(b)(1) of the Act, 21 U.S.C.section 360bbb-3(b)(1), unless the authorization is terminated  or revoked sooner.       Influenza A by PCR NEGATIVE NEGATIVE Final   Influenza B by PCR NEGATIVE NEGATIVE Final    Comment: (NOTE) The Xpert Xpress SARS-CoV-2/FLU/RSV plus assay is intended as an aid in the diagnosis of influenza from Nasopharyngeal swab specimens and should not be used as a sole basis for treatment. Nasal washings and aspirates are unacceptable for Xpert Xpress SARS-CoV-2/FLU/RSV testing.  Fact Sheet for Patients: EntrepreneurPulse.com.au  Fact Sheet for Healthcare Providers: IncredibleEmployment.be  This test is not yet approved or cleared by the [place] FDA and has been authorized for detection and/or diagnosis of SARS-CoV-2 by FDA under an Emergency Use Authorization (EUA). This EUA will remain in effect (meaning this test can be used) for the duration of the COVID-19 declaration under Section 564(b)(1) of the Act, 21 U.S.C. section 360bbb-3(b)(1), unless the authorization is terminated or revoked.  Performed at [facility], [place] [address], [place], [place] [address]      Time coordinating discharge: Over 30 minutes  SIGNED:   Darilyn Storbeck J [place] DO  [facility] 06/12/2022, 8:47 AM

## 2022-06-12 NOTE — ED Provider Notes (Signed)
Got notified of positive blood culture, 1 out of 2 bottles with Staph epidermidis.  Was discharged today per chart review on Levaquin.  Neutropenic with no source for infection.  Talked with Dr. Juleen China with ID.  We are both in agreement that this is likely contamination.  No further work-up needed.   Lennice Sites, DO 06/12/22 2226

## 2022-06-12 NOTE — Progress Notes (Signed)
Pharmacy Antibiotic Note  Jerami Tammen is a 76 y.o. male admitted on 06/11/2022 with febrile neutropenia.  Pharmacy has been consulted for Cefepime + Vancomycin dosing.  Plan: Cefepime 2gm IV q8h Vancomycin 2000mg  IV q24h to target AUC 400-550.  Estimated AUC 496. Check Vancomycin levels at steady state Monitor renal function and cx data      Temp (24hrs), Avg:97.5 F (36.4 C), Min:97.5 F (36.4 C), Max:97.5 F (36.4 C)  Recent Labs  Lab 06/11/22 2312 06/12/22 0455  WBC 1.4* 0.4*  CREATININE 1.14  --   LATICACIDVEN 1.4  --     Estimated Creatinine Clearance: 66.3 mL/min (by C-G formula based on SCr of 1.14 mg/dL).    No Known Allergies  Antimicrobials this admission: 8/21 Vancomycin >>  8/21 Cefepime >>   Dose adjustments this admission:  Microbiology results: 8/20 BCx:  8/21 UCx:    Thank you for allowing pharmacy to be a part of this patient's care.  Netta Cedars PharmD 06/12/2022 5:30 AM

## 2022-06-12 NOTE — H&P (Signed)
History and Physical    Austin Wells SWF:[id number] DOB: [birth year] DOA: 06/11/2022  PCP: Curt Bears, MD  Patient coming from: Home  Chief Complaint: Shortness of breath  HPI: Austin Wells is a 76 y.o. male with medical history significant of extensive stage (T1b, N3, M1 a) small cell lung cancer on palliative chemotherapy, active smoker, COPD, bipolar disorder, depression, GERD, hyperlipidemia, hypertension, hypothyroidism, OSA presents to the ED via EMS for evaluation of respiratory distress.  Upon EMS arrival patient noted to be tachypneic to the 40s with scattered wheezing and rhonchi.  He was given DuoNeb treatment and Solu-Medrol with some improvement.  Not hypoxic.  Labs showing WBC 1.4 (ANC 0.2), hemoglobin 10.6, MCV 92.4, platelet count 63k, potassium 3.4, BUN 25, creatinine 1.1, T. bili 1.4 with normal remainder of LFTs, blood cultures drawn, lactic acid normal, valproic acid level <10, BNP normal, troponin negative x2, COVID and influenza PCR negative, UA and urine culture pending.  CTA chest negative for PE.  Showing significant interval increase in the size of left hilar mass/adenopathy with encasement of the left pulmonary artery and left upper and hilar bronchi as well as invasion into the left lower lobe pulmonary artery branch.  A 7 x 21 mm left lower lobe subpleural nodule similar or minimally increased since the prior CT. Patient was given DuoNeb treatment, vancomycin, and cefepime in the ED.  Patient reports 1 week history of progressively worsening shortness of breath.  He is coughing.  Denies fevers or chest pain.  He is using his home inhalers.  No other complaints.  Denies nausea, vomiting, abdominal pain, urinary symptoms, hematemesis, hematochezia, or any bleeding.  Review of Systems:  Review of Systems  All other systems reviewed and are negative.   Past Medical History:  Diagnosis Date   Bipolar disorder ([place])    Depression    GERD (gastroesophageal reflux  disease)    Hyperlipidemia    Hypertension    Orthostatic hypotension    Sleep apnea    has a cpap and doesn't use it   Syncope and collapse    Tobacco use     Past Surgical History:  Procedure Laterality Date   BRONCHIAL BIOPSY  08/30/2021   Procedure: BRONCHIAL BIOPSIES;  Surgeon: Garner Nash, DO;  Location: [facility];  Service: Pulmonary;;   BRONCHIAL BRUSHINGS  08/30/2021   Procedure: BRONCHIAL BRUSHINGS;  Surgeon: Garner Nash, DO;  Location: [facility];  Service: Pulmonary;;   BRONCHIAL NEEDLE ASPIRATION BIOPSY  08/30/2021   Procedure: BRONCHIAL NEEDLE ASPIRATION BIOPSIES;  Surgeon: Garner Nash, DO;  Location: [facility];  Service: Pulmonary;;   BRONCHIAL WASHINGS  08/30/2021   Procedure: BRONCHIAL WASHINGS;  Surgeon: Garner Nash, DO;  Location: [facility];  Service: Pulmonary;;   COLONOSCOPY     FINE NEEDLE ASPIRATION  08/30/2021   Procedure: FINE NEEDLE ASPIRATION (FNA) LINEAR;  Surgeon: Garner Nash, DO;  Location: [facility];  Service: Pulmonary;;   HERNIA REPAIR     inquinal   IR IMAGING GUIDED PORT INSERTION  09/26/2021   VIDEO BRONCHOSCOPY WITH ENDOBRONCHIAL NAVIGATION Left 08/30/2021   Procedure: VIDEO BRONCHOSCOPY WITH ENDOBRONCHIAL NAVIGATION;  Surgeon: Garner Nash, DO;  Location: [facility];  Service: Pulmonary;  Laterality: Left;  ION w/ CIOS   VIDEO BRONCHOSCOPY WITH ENDOBRONCHIAL ULTRASOUND Left 08/30/2021   Procedure: VIDEO BRONCHOSCOPY WITH ENDOBRONCHIAL ULTRASOUND;  Surgeon: Garner Nash, DO;  Location: [facility];  Service: Pulmonary;  Laterality: Left;   VIDEO BRONCHOSCOPY WITH RADIAL ENDOBRONCHIAL ULTRASOUND  08/30/2021   Procedure: RADIAL ENDOBRONCHIAL ULTRASOUND;  Surgeon: Garner Nash, DO;  Location: [facility];  Service: Pulmonary;;     reports that he has been smoking cigarettes. He started smoking about 56 years ago. He has a 55.00 pack-year smoking history. He has never used smokeless tobacco. He reports  current alcohol use. He reports current drug use. Drug: "Crack" cocaine.  No Known Allergies  Family History  Problem Relation Age of Onset   Thyroid disease Neg Hx     Prior to Admission medications   Medication Sig Start Date End Date Taking? Authorizing Provider  ARIPiprazole (ABILIFY) 5 MG tablet Take 5 mg by mouth daily. For mood stabilization and depression    [provider]  calcium-vitamin D (OSCAL WITH D) 500-200 MG-UNIT tablet Take 1 tablet by mouth daily.    [provider]  cyanocobalamin 500 MCG tablet Take 500 mcg by mouth daily. Vitamin B12    [provider]  divalproex (DEPAKOTE) 500 MG DR tablet Take by mouth.    [provider]  folic acid (FOLVITE) 1 MG tablet Take 1 tablet by mouth daily. 07/07/21   [provider]  hydrochlorothiazide (HYDRODIURIL) 25 MG tablet Take 12.5 mg by mouth daily.    [provider]  levothyroxine (SYNTHROID) 75 MCG tablet Take 1 tablet by mouth daily(morning) on empty stomach 04/07/22   Elayne Snare, MD  lidocaine-prilocaine (EMLA) cream Apply to the Port-A-Cath site 30-60-minute before chemotherapy 09/09/21   Curt Bears, MD  lisinopril (PRINIVIL,ZESTRIL) 40 MG tablet Take 1 tablet (40 mg total) by mouth daily. For hypertension. Patient taking differently: Take 40 mg by mouth daily. For blood pressure 06/19/13   Mashburn, Milta Deiters T, PA-C  MELATONIN PO Take 1 tablet by mouth at bedtime.    [provider]  prochlorperazine (COMPAZINE) 10 MG tablet Take 1 tablet (10 mg total) by mouth every 6 (six) hours as needed for nausea or vomiting. 09/09/21   Curt Bears, MD  propranolol (INDERAL) 10 MG tablet Take 10 mg by mouth 2 (two) times daily.    [provider]  rosuvastatin (CRESTOR) 40 MG tablet Take 40 mg by mouth daily.    [provider]  Tiotropium Bromide-Olodaterol 2.5-2.5 MCG/ACT AERS INHALE 2 PUFFS BY MOUTH DAILY 02/19/21   [provider]   traZODone (DESYREL) 150 MG tablet Take 150 mg by mouth at bedtime as needed for sleep.    [provider]  venlafaxine XR (EFFEXOR-XR) 75 MG 24 hr capsule Take 75 mg by mouth daily with breakfast. 11/18/20   [provider]    Physical Exam: Vitals:   06/12/22 0000 06/12/22 0030 06/12/22 0100 06/12/22 0145  BP: (!) 120/94 (!) 130/94 105/89 (!) 155/109  Pulse: (!) 105 (!) 105 (!) 103 99  Resp: 19 (!) 31 (!) 40 (!) 31  Temp:      TempSrc:      SpO2: 96% 99% 100% 99%    Physical Exam Vitals reviewed.  Constitutional:      General: He is not in acute distress. HENT:     Head: Normocephalic and atraumatic.  Eyes:     Extraocular Movements: Extraocular movements intact.  Cardiovascular:     Rate and Rhythm: Normal rate and regular rhythm.     Pulses: Normal pulses.  Pulmonary:     Effort: No respiratory distress.     Breath sounds: No wheezing or rales.  Abdominal:     General: Bowel sounds are normal. There is  no distension.     Palpations: Abdomen is soft.     Tenderness: There is no abdominal tenderness.  Musculoskeletal:        General: No swelling or tenderness.     Cervical back: Normal range of motion.  Skin:    General: Skin is warm and dry.  Neurological:     General: No focal deficit present.     Mental Status: He is alert and oriented to person, place, and time.      Labs on Admission: I have personally reviewed following labs and imaging studies  CBC: Recent Labs  Lab 06/11/22 2312  WBC 1.4*  NEUTROABS 0.2*  HGB 10.6*  HCT 29.0*  MCV 92.4  PLT 63*   Basic Metabolic Panel: Recent Labs  Lab 06/11/22 2312  NA 138  K 3.4*  CL 108  CO2 23  GLUCOSE 132*  BUN 25*  CREATININE 1.14  CALCIUM 9.3   GFR: Estimated Creatinine Clearance: 66.3 mL/min (by C-G formula based on SCr of 1.14 mg/dL). Liver Function Tests: Recent Labs  Lab 06/11/22 2312  AST 30  ALT 37  ALKPHOS 58  BILITOT 1.4*  PROT 7.4  ALBUMIN 4.3   No results  for input(s): "LIPASE", "AMYLASE" in the last 168 hours. No results for input(s): "AMMONIA" in the last 168 hours. Coagulation Profile: No results for input(s): "INR", "PROTIME" in the last 168 hours. Cardiac Enzymes: No results for input(s): "CKTOTAL", "CKMB", "CKMBINDEX", "TROPONINI" in the last 168 hours. BNP (last 3 results) No results for input(s): "PROBNP" in the last 8760 hours. HbA1C: No results for input(s): "HGBA1C" in the last 72 hours. CBG: No results for input(s): "GLUCAP" in the last 168 hours. Lipid Profile: No results for input(s): "CHOL", "HDL", "LDLCALC", "TRIG", "CHOLHDL", "LDLDIRECT" in the last 72 hours. Thyroid Function Tests: No results for input(s): "TSH", "T4TOTAL", "FREET4", "T3FREE", "THYROIDAB" in the last 72 hours. Anemia Panel: No results for input(s): "VITAMINB12", "FOLATE", "FERRITIN", "TIBC", "IRON", "RETICCTPCT" in the last 72 hours. Urine analysis:    Component Value Date/Time   COLORURINE YELLOW 02/03/2017 1654   APPEARANCEUR HAZY (A) 02/03/2017 1654   LABSPEC 1.014 02/03/2017 1654   PHURINE 6.0 02/03/2017 1654   GLUCOSEU NEGATIVE 02/03/2017 1654   HGBUR NEGATIVE 02/03/2017 1654   BILIRUBINUR NEGATIVE 02/03/2017 1654   KETONESUR NEGATIVE 02/03/2017 1654   PROTEINUR NEGATIVE 02/03/2017 1654   UROBILINOGEN 1.0 06/13/2013 1408   NITRITE NEGATIVE 02/03/2017 1654   LEUKOCYTESUR NEGATIVE 02/03/2017 1654    Radiological Exams on Admission: I have personally reviewed images CT Angio Chest PE W and/or Wo Contrast  Result Date: 06/12/2022 CLINICAL DATA:  Concern for pulmonary embolism. Small cell lung cancer. EXAM: CT ANGIOGRAPHY CHEST WITH CONTRAST TECHNIQUE: Multidetector CT imaging of the chest was performed using the standard protocol during bolus administration of intravenous contrast. Multiplanar CT image reconstructions and MIPs were obtained to evaluate the vascular anatomy. RADIATION DOSE REDUCTION: This exam was performed according to the  departmental dose-optimization program which includes automated exposure control, adjustment of the mA and/or kV according to patient size and/or use of iterative reconstruction technique. CONTRAST:  141mL OMNIPAQUE IOHEXOL 350 MG/ML SOLN COMPARISON:  CT of the chest abdomen pelvis dated 04/01/2022. FINDINGS: Cardiovascular: There is no cardiomegaly or pericardial effusion. Mild atherosclerotic calcification of the thoracic aorta. No aneurysmal dilatation or dissection. Soft tissue density within the left lower lobe pulmonary artery branch (125/5) likely represents intravascular invasion of the left hilar mass/adenopathy. No pulmonary artery embolus identified. Right-sided Port-A-Cath with  tip at the cavoatrial junction. Mediastinum/Nodes: Left hilar mass measures approximately 4.5 x 9.0 cm in greatest axial dimensions. There is encasement of the left main pulmonary artery and left upper and hilar bronchi. There is invasion into the left lower lobe pulmonary artery branch (125/5). Overall significant interval increase in the size of the left hilar mass/adenopathy compared to prior CT. Subcarinal lymph nodes measure 12 mm short axis. Right hilar adenopathy measures 17 mm in short axis, new or progressed since the prior CT. Several mildly enlarged and rounded lymph nodes adjacent to the esophagus. Lungs/Pleura: Diffuse interstitial coarsening and subpleural reticulation. Left lower lobe subpleural nodule measuring 7 x 21 mm (85/6). No pleural effusion or pneumothorax. The central airways are patent. Upper Abdomen: Gallstone. Musculoskeletal: Degenerative changes of the spine. No acute osseous pathology. Review of the MIP images confirms the above findings. IMPRESSION: 1. No CT evidence of pulmonary artery embolus. 2. Significant interval increase in the size of the left hilar mass/adenopathy compared to the prior CT. There is encasement of the left main pulmonary artery and left upper and hilar bronchi as well as  invasion into the left lower lobe pulmonary artery branch. 3. A 7 x 21 mm left lower lobe subpleural nodule similar or minimally increased since the prior CT. 4. Cholelithiasis. 5. Aortic Atherosclerosis (ICD10-I70.0). Electronically Signed   By: Anner Crete M.D.   On: 06/12/2022 01:35   DG Chest Port 1 View  Result Date: 06/12/2022 CLINICAL DATA:  Sepsis, shortness of breath, cancer patient EXAM: PORTABLE CHEST 1 VIEW COMPARISON:  08/30/2021.  Chest CT 04/01/2022. FINDINGS: Right Port-A-Cath in place with the tip in the SVC. Cardiomegaly. Increased markings in the lungs bilaterally, left greater than right, compatible with chronic lung disease/fibrosis. This is unchanged since prior studies. No effusions or acute confluent opacities. No acute bony abnormality. IMPRESSION: Stable chronic changes.  No active disease. Electronically Signed   By: Rolm Baptise M.D.   On: 06/12/2022 00:11    EKG: Pending at this time.  Assessment and Plan  Acute respiratory failure Likely secondary to progressive lung cancer and possible acute COPD exacerbation.  CTA chest negative for PE, showing significant interval increase in the size of left hilar mass/adenopathy with encasement of the left pulmonary artery and left upper and hilar bronchi as well as invasion into the left lower lobe pulmonary artery branch.  At present, patient is satting well on room air and comfortable when laying still in bed.  However, during physical exam became tachypneic just sitting up in the bed.  No wheezing or rhonchi appreciated on exam.   -Received Solu-Medrol by EMS -Continue DuoNeb as needed -Continuous pulse ox, supplemental oxygen as needed -May need BiPAP if work of breathing worsens -Palliative care consulted -Consult oncology in a.m.  Stage IV small cell lung cancer Patient is currently on palliative chemotherapy and CT done today concerning for significant progression of disease.   -Palliative care consulted -Consult  oncology in a.m.  Pancytopenia Likely due to chemotherapy.  Repeat labs showing WBC 0.4 (ANC 0.2), hemoglobin 9.6, platelet count 53k.  No signs of bleeding. -Consult oncology in a.m.  Neutropenia SIRS Tachycardia and tachypnea have improved.  Patient is afebrile and no obvious infectious etiology at this time.  No lactic acidosis.  COVID and influenza PCR negative.  Chest imaging not suggestive of pneumonia.  UA without signs of infection.  No meningeal signs. -Continue broad-spectrum antibiotics -Blood and urine cultures pending -Check procalcitonin  Mild hypokalemia -Replace potassium and  continue to monitor -Check magnesium level  Bipolar disorder Depression GERD Hyperlipidemia Hypertension: Currently normotensive. Hypothyroidism -Pharmacy med rec pending  Abnormal lab  Valproic acid level <10. Unclear whether Depakote is a current medication. -Pharmacy med rec pending  DVT prophylaxis: SCDs Code Status: DNR (discussed with the patient) Family Communication: No family available at this time. Level of care: Progressive Care Unit Admission status: It is my clinical opinion that referral for OBSERVATION is reasonable and necessary in this patient based on the above information provided. The aforementioned taken together are felt to place the patient at high risk for further clinical deterioration. However, it is anticipated that the patient may be medically stable for discharge from the hospital within 24 to 48 hours.   Shela Leff MD [facility]  If 7PM-7AM, please contact night-coverage [email or web address]  06/12/2022, 2:37 AM

## 2022-06-12 NOTE — Telephone Encounter (Signed)
Pts wife called advising he was taken via EMS to [facility] ER yesterday 08/11/22 for severe SOB/E and was advised, per his CTA results, his cancer progressed and was advised to see ONC as soon as possible.  Discussed with Cassandra, PA-C, who advised to schedule pt for next available to discuss next steps.  I have scheduled the pt for tomorrow 06/13/22 at 11am. I have LVM for pts wife advising of this appt date and time. I requested she call back with this appt did not work for them.

## 2022-06-13 ENCOUNTER — Encounter: Payer: Self-pay | Admitting: Internal Medicine

## 2022-06-13 ENCOUNTER — Inpatient Hospital Stay: Payer: No Typology Code available for payment source | Admitting: Internal Medicine

## 2022-06-13 ENCOUNTER — Inpatient Hospital Stay: Payer: No Typology Code available for payment source

## 2022-06-13 ENCOUNTER — Other Ambulatory Visit: Payer: Self-pay

## 2022-06-13 ENCOUNTER — Telehealth: Payer: Self-pay | Admitting: Medical Oncology

## 2022-06-13 ENCOUNTER — Inpatient Hospital Stay: Payer: Medicare Other | Attending: Internal Medicine | Admitting: Internal Medicine

## 2022-06-13 DIAGNOSIS — C3490 Malignant neoplasm of unspecified part of unspecified bronchus or lung: Secondary | ICD-10-CM | POA: Diagnosis not present

## 2022-06-13 DIAGNOSIS — Z5111 Encounter for antineoplastic chemotherapy: Secondary | ICD-10-CM | POA: Diagnosis not present

## 2022-06-13 DIAGNOSIS — C3411 Malignant neoplasm of upper lobe, right bronchus or lung: Secondary | ICD-10-CM | POA: Insufficient documentation

## 2022-06-13 LAB — URINE CULTURE

## 2022-06-13 MED ORDER — OXYCODONE-ACETAMINOPHEN 5-325 MG PO TABS: 1.0000 | ORAL_TABLET | Freq: Three times a day (TID) | ORAL | 0 refills | Status: AC | PRN

## 2022-06-13 NOTE — Telephone Encounter (Signed)
Referral to Hospice completed.

## 2022-06-13 NOTE — Progress Notes (Signed)
Received referral message from Manuela Schwartz in social work regarding spouse concern with bills and grant funds.  Reached out via phone and left voicemail with my contact name and number.  Also, emailed acceptable expense sheet and time-frame of payments.  Provided my contact name, number, email, and fax in email as well for any additional financial questions or concerns.

## 2022-06-13 NOTE — Telephone Encounter (Signed)
Met with Austin Wells in lobby .   Kol cannot come in today due to declining health , sob, pain.  I changed his appt to a virtual visit with Dr. Julien Nordmann. LVM on Dale's phone.

## 2022-06-13 NOTE — Progress Notes (Signed)
[place] [phone]   Fax:([phone]) [place], [place], MD [address] [place] [address]  I connected withNAME@ on 06/13/22 at  1:15 PM EDT by video enabled telemedicine visit and verified that I am speaking with the correct person using two identifiers.   I discussed the limitations, risks, security and privacy concerns of performing an evaluation and management service by telemedicine and the availability of in-person appointments. I also discussed with the patient that there may be a patient responsible charge related to this service. The patient expressed understanding and agreed to proceed.  Other persons participating in the visit and their role in the encounter: Wife  Patient's location: Home Provider's location: [facility]  DIAGNOSIS: Extensive stage (T1b, N3, M1 a) small cell lung cancer presented with right upper lobe nodule in addition to multiple left lung nodule as well as bilateral hilar and mediastinal lymphadenopathy diagnosed in November 2022.   PRIOR THERAPY: 1) Systemic chemotherapy with carboplatin for AUC of 5 on day 1 and etoposide 100 Mg/M2 on days 1, 2 and 3 with Cosela for Myeloprotection as well as Imfinzi 1500 Mg IV every 3 weeks.  Started September 20, 2021.  Status post 8 cycles.  Starting from cycle #5, the patient started maintenance immunotherapy with Imfinzi. Last dose on 03/07/22 2)  Palliative systemic chemotherapy with Zepzelca 3.2 mg/m2 IV every 3 weeks. First dose 04/11/22.  Status post 3 cycles.     CURRENT THERAPY: Hospice and palliative care  INTERVAL HISTORY: Austin Wells 76 y.o. male has a MyChart video virtual visit with me today.  The patient was accompanied by his wife at home.  He was recently seen at the emergency department and admitted to [facility] complaining of increasing pain in the chest as well as shortness of breath.  He  had CT angiogram of the chest performed during his hospitalization that showed significant disease progression.  The patient continues to complain of increasing fatigue and weakness.  He has no current nausea, vomiting, diarrhea or constipation.  He has no headache or visual changes.  He is currently thinking about more comfort care than any additional treatment.  We are having the visit for discussion of his treatment options.  MEDICAL HISTORY: Past Medical History:  Diagnosis Date   Bipolar disorder ([place])    Depression    GERD (gastroesophageal reflux disease)    Hyperlipidemia    Hypertension    Orthostatic hypotension    Sleep apnea    has a cpap and doesn't use it   Syncope and collapse    Tobacco use     ALLERGIES:  has No Known Allergies.  MEDICATIONS:  Current Outpatient Medications  Medication Sig Dispense Refill   ARIPiprazole (ABILIFY) 5 MG tablet Take 5 mg by mouth daily. For mood stabilization and depression     calcium-vitamin D (OSCAL WITH D) 500-200 MG-UNIT tablet Take 1 tablet by mouth daily.     cyanocobalamin 500 MCG tablet Take 500 mcg by mouth daily. Vitamin B12     divalproex (DEPAKOTE) 500 MG DR tablet Take by mouth.     folic acid (FOLVITE) 1 MG tablet Take 1 tablet by mouth daily.     hydrochlorothiazide (HYDRODIURIL) 25 MG tablet Take 12.5 mg by mouth daily.     levofloxacin (LEVAQUIN) 750 MG tablet Take 1 tablet (750 mg total) by mouth daily for 7 days. 7 tablet 0   levothyroxine (  SYNTHROID) 75 MCG tablet Take 1 tablet by mouth daily(morning) on empty stomach 30 tablet 2   lidocaine-prilocaine (EMLA) cream Apply to the Port-A-Cath site 30-60-minute before chemotherapy 30 g 0   lisinopril (PRINIVIL,ZESTRIL) 40 MG tablet Take 1 tablet (40 mg total) by mouth daily. For hypertension. (Patient taking differently: Take 40 mg by mouth daily. For blood pressure)     MELATONIN PO Take 1 tablet by mouth at bedtime.     prochlorperazine (COMPAZINE) 10 MG tablet Take 1  tablet (10 mg total) by mouth every 6 (six) hours as needed for nausea or vomiting. 30 tablet 0   propranolol (INDERAL) 10 MG tablet Take 10 mg by mouth 2 (two) times daily.     rosuvastatin (CRESTOR) 40 MG tablet Take 40 mg by mouth daily.     Tiotropium Bromide-Olodaterol 2.5-2.5 MCG/ACT AERS INHALE 2 PUFFS BY MOUTH DAILY     traZODone (DESYREL) 150 MG tablet Take 150 mg by mouth at bedtime as needed for sleep.     venlafaxine XR (EFFEXOR-XR) 75 MG 24 hr capsule Take 75 mg by mouth daily with breakfast.     No current facility-administered medications for this visit.    SURGICAL HISTORY:  Past Surgical History:  Procedure Laterality Date   BRONCHIAL BIOPSY  08/30/2021   Procedure: BRONCHIAL BIOPSIES;  Surgeon: Garner Nash, DO;  Location: [place] [facility];  Service: Pulmonary;;   BRONCHIAL BRUSHINGS  08/30/2021   Procedure: BRONCHIAL BRUSHINGS;  Surgeon: Garner Nash, DO;  Location: [place] [facility];  Service: Pulmonary;;   BRONCHIAL NEEDLE ASPIRATION BIOPSY  08/30/2021   Procedure: BRONCHIAL NEEDLE ASPIRATION BIOPSIES;  Surgeon: Garner Nash, DO;  Location: [place];  Service: Pulmonary;;   BRONCHIAL WASHINGS  08/30/2021   Procedure: BRONCHIAL WASHINGS;  Surgeon: Garner Nash, DO;  Location: [place] [facility];  Service: Pulmonary;;   COLONOSCOPY     FINE NEEDLE ASPIRATION  08/30/2021   Procedure: FINE NEEDLE ASPIRATION (FNA) LINEAR;  Surgeon: Garner Nash, DO;  Location: [place] [facility];  Service: Pulmonary;;   HERNIA REPAIR     inquinal   IR IMAGING GUIDED PORT INSERTION  09/26/2021   VIDEO BRONCHOSCOPY WITH ENDOBRONCHIAL NAVIGATION Left 08/30/2021   Procedure: VIDEO BRONCHOSCOPY WITH ENDOBRONCHIAL NAVIGATION;  Surgeon: Garner Nash, DO;  Location: [place];  Service: Pulmonary;  Laterality: Left;  ION w/ CIOS   VIDEO BRONCHOSCOPY WITH ENDOBRONCHIAL ULTRASOUND Left 08/30/2021   Procedure: VIDEO BRONCHOSCOPY WITH ENDOBRONCHIAL ULTRASOUND;  Surgeon: Garner Nash, DO;   Location: [place] [facility];  Service: Pulmonary;  Laterality: Left;   VIDEO BRONCHOSCOPY WITH RADIAL ENDOBRONCHIAL ULTRASOUND  08/30/2021   Procedure: RADIAL ENDOBRONCHIAL ULTRASOUND;  Surgeon: Garner Nash, DO;  Location: [place] [facility];  Service: Pulmonary;;    REVIEW OF SYSTEMS:  Constitutional: positive for anorexia, fatigue, and weight loss Eyes: negative Ears, nose, mouth, throat, and face: negative Respiratory: positive for cough, dyspnea on exertion, and pleurisy/chest pain Cardiovascular: negative Gastrointestinal: negative Genitourinary:negative Integument/breast: negative Hematologic/lymphatic: negative Musculoskeletal:positive for muscle weakness Neurological: negative Behavioral/Psych: negative Endocrine: negative Allergic/Immunologic: negative     LABORATORY DATA: Lab Results  Component Value Date   WBC 0.4 (LL) 06/12/2022   HGB 9.6 (L) 06/12/2022   HCT 27.2 (L) 06/12/2022   MCV 95.1 06/12/2022   PLT 53 (L) 06/12/2022      Chemistry      Component Value Date/Time   NA 138 06/12/2022 0455   K 3.4 (L) 06/12/2022 0455   CL 112 (H) 06/12/2022 0455   CO2  19 (L) 06/12/2022 0455   BUN 24 (H) 06/12/2022 0455   CREATININE 1.03 06/12/2022 0455   CREATININE 0.97 05/31/2022 1115      Component Value Date/Time   CALCIUM 8.6 (L) 06/12/2022 0455   ALKPHOS 58 06/11/2022 2312   AST 30 06/11/2022 2312   AST 26 05/31/2022 1115   ALT 37 06/11/2022 2312   ALT 18 05/31/2022 1115   BILITOT 1.4 (H) 06/11/2022 2312   BILITOT 1.1 05/31/2022 1115       RADIOGRAPHIC STUDIES: CT Angio Chest PE W and/or Wo Contrast  Result Date: 06/12/2022 CLINICAL DATA:  Concern for pulmonary embolism. Small cell lung cancer. EXAM: CT ANGIOGRAPHY CHEST WITH CONTRAST TECHNIQUE: Multidetector CT imaging of the chest was performed using the standard protocol during bolus administration of intravenous contrast. Multiplanar CT image reconstructions and MIPs were obtained to evaluate the vascular  anatomy. RADIATION DOSE REDUCTION: This exam was performed according to the departmental dose-optimization program which includes automated exposure control, adjustment of the mA and/or kV according to patient size and/or use of iterative reconstruction technique. CONTRAST:  193mL OMNIPAQUE IOHEXOL 350 MG/ML SOLN COMPARISON:  CT of the chest abdomen pelvis dated 04/01/2022. FINDINGS: Cardiovascular: There is no cardiomegaly or pericardial effusion. Mild atherosclerotic calcification of the thoracic aorta. No aneurysmal dilatation or dissection. Soft tissue density within the left lower lobe pulmonary artery branch (125/5) likely represents intravascular invasion of the left hilar mass/adenopathy. No pulmonary artery embolus identified. Right-sided Port-A-Cath with tip at the cavoatrial junction. Mediastinum/Nodes: Left hilar mass measures approximately 4.5 x 9.0 cm in greatest axial dimensions. There is encasement of the left main pulmonary artery and left upper and hilar bronchi. There is invasion into the left lower lobe pulmonary artery branch (125/5). Overall significant interval increase in the size of the left hilar mass/adenopathy compared to prior CT. Subcarinal lymph nodes measure 12 mm short axis. Right hilar adenopathy measures 17 mm in short axis, new or progressed since the prior CT. Several mildly enlarged and rounded lymph nodes adjacent to the esophagus. Lungs/Pleura: Diffuse interstitial coarsening and subpleural reticulation. Left lower lobe subpleural nodule measuring 7 x 21 mm (85/6). No pleural effusion or pneumothorax. The central airways are patent. Upper Abdomen: Gallstone. Musculoskeletal: Degenerative changes of the spine. No acute osseous pathology. Review of the MIP images confirms the above findings. IMPRESSION: 1. No CT evidence of pulmonary artery embolus. 2. Significant interval increase in the size of the left hilar mass/adenopathy compared to the prior CT. There is encasement of the  left main pulmonary artery and left upper and hilar bronchi as well as invasion into the left lower lobe pulmonary artery branch. 3. A 7 x 21 mm left lower lobe subpleural nodule similar or minimally increased since the prior CT. 4. Cholelithiasis. 5. Aortic Atherosclerosis (ICD10-I70.0). Electronically Signed   By: Anner Crete M.D.   On: 06/12/2022 01:35   DG Chest Port 1 View  Result Date: 06/12/2022 CLINICAL DATA:  Sepsis, shortness of breath, cancer patient EXAM: PORTABLE CHEST 1 VIEW COMPARISON:  08/30/2021.  Chest CT 04/01/2022. FINDINGS: Right Port-A-Cath in place with the tip in the SVC. Cardiomegaly. Increased markings in the lungs bilaterally, left greater than right, compatible with chronic lung disease/fibrosis. This is unchanged since prior studies. No effusions or acute confluent opacities. No acute bony abnormality. IMPRESSION: Stable chronic changes.  No active disease. Electronically Signed   By: Rolm Baptise M.D.   On: 06/12/2022 00:11    ASSESSMENT AND PLAN: This is a very  pleasant 76 years old white male diagnosed with extensive stage (T1b, N3, M1 a) small cell lung cancer presented with right upper lobe lung nodule in addition to multiple left lung nodules and bilateral and mediastinal lymphadenopathy diagnosed in November 2022. The patient underwent systemic chemotherapy with carboplatin for AUC of 5 on day 1, etoposide 100 Mg/M2 on days 1, 2 and 3 with Cosela 240 Mg/M2 for Myeloprotection before chemotherapy as well as Imfinzi 1500 Mg IV every 3 weeks with chemo.  He is status post 8 cycle of treatment.  Starting from cycle #5 the patient is on treatment with single agent Imfinzi 1500 Mg IV every 4 weeks.  This treatment was discontinued secondary to disease progression. The patient is started second line systemic chemotherapy with Zepzelca (lurbinectedin) 3.2 Mg/M2 every 3 weeks.  Status post 3 cycles.  He had rough time tolerating this treatment. Recent CT angiogram of the  chest showed significant disease progression with increase in the size of the left hilar mass/adenopathy with encasement of the left main pulmonary artery and left upper lobe and hilar bronchi as well as invasion into the left lower lobe pulmonary artery branch. I discussed with the patient and his wife his treatment options including palliative care and hospice versus consideration of palliative radiotherapy to the progressive disease in the left lung. The patient and his wife are more interested in palliative care and hospice at this point.  They want him to be comfortable at home. We will call the palliative care and hospice service to evaluate the patient. For the pain management I will send him prescription for Percocet 5/325 mg p.o. every 8 hours as needed for pain. We will see the patient on as-needed basis at this point.  The patient and his wife were advised to call if we can help in any way. I discussed the assessment and treatment plan with the patient. The patient was provided an opportunity to ask questions and all were answered. The patient agreed with the plan and demonstrated an understanding of the instructions.   The patient was advised to call back or seek an in-person evaluation if the symptoms worsen or if the condition fails to improve as anticipated.  I provided 30 minutes of face-to-face video visit time during this encounter, and > 50% was spent counseling as documented under my assessment & plan.  Eilleen Kempf, MD 06/13/2022 1:05 PM  Disclaimer: This note was dictated with voice recognition software. Similar sounding words can inadvertently be transcribed and may not be corrected upon review.

## 2022-06-14 LAB — CULTURE, BLOOD (ROUTINE X 2): Special Requests: ADEQUATE

## 2022-06-15 ENCOUNTER — Telehealth: Payer: Self-pay

## 2022-06-15 NOTE — Telephone Encounter (Signed)
Post ED Visit - Positive Culture Follow-up  Culture report reviewed by antimicrobial stewardship pharmacist: [place] Team []  Elenor Quinones, Pharm.D. []  Heide Guile, Pharm.D., BCPS AQ-ID []  Parks Neptune, Pharm.D., BCPS []  Alycia Rossetti, Pharm.D., BCPS []  Muldrow, Pharm.D., BCPS, AAHIVP []  Legrand Como, Pharm.D., BCPS, AAHIVP []  Salome Arnt, PharmD, BCPS []  Johnnette Gourd, PharmD, BCPS []  Hughes Better, PharmD, BCPS []  Leeroy Cha, PharmD []  Laqueta Linden, PharmD, BCPS []  Albertina Parr, PharmD  [facility] Team [x]  Jimmy Footman, PharmD []  Lindell Spar, PharmD []  Royetta Asal, PharmD []  Graylin Shiver, Rph []  Rema Fendt) Glennon Mac, PharmD []  Arlyn Dunning, PharmD []  Netta Cedars, PharmD []  Dia Sitter, PharmD []  Leone Haven, PharmD []  Gretta Arab, PharmD []  Theodis Shove, PharmD []  Peggyann Juba, PharmD []  Reuel Boom, PharmD   Positive blood culture Treated with Levofloxacin. Likely a contaminant and no further patient follow-up is required at this time.  Glennon Hamilton 06/15/2022, 10:24 AM

## 2022-06-16 ENCOUNTER — Other Ambulatory Visit (HOSPITAL_COMMUNITY): Payer: No Typology Code available for payment source

## 2022-06-17 LAB — CULTURE, BLOOD (ROUTINE X 2)
Culture: NO GROWTH
Special Requests: ADEQUATE

## 2022-06-20 ENCOUNTER — Inpatient Hospital Stay: Payer: No Typology Code available for payment source

## 2022-06-21 ENCOUNTER — Inpatient Hospital Stay: Payer: No Typology Code available for payment source

## 2022-06-21 ENCOUNTER — Inpatient Hospital Stay: Payer: No Typology Code available for payment source | Admitting: Internal Medicine

## 2022-06-21 ENCOUNTER — Other Ambulatory Visit: Payer: No Typology Code available for payment source

## 2022-06-27 ENCOUNTER — Inpatient Hospital Stay: Payer: Medicare Other

## 2022-07-04 ENCOUNTER — Ambulatory Visit: Payer: No Typology Code available for payment source

## 2022-07-04 ENCOUNTER — Ambulatory Visit: Payer: No Typology Code available for payment source | Admitting: Physician Assistant

## 2022-07-04 ENCOUNTER — Other Ambulatory Visit: Payer: No Typology Code available for payment source

## 2022-07-05 ENCOUNTER — Inpatient Hospital Stay: Payer: Medicare Other

## 2022-07-05 ENCOUNTER — Ambulatory Visit: Payer: No Typology Code available for payment source | Admitting: Physician Assistant

## 2022-07-05 ENCOUNTER — Ambulatory Visit: Payer: No Typology Code available for payment source

## 2022-07-05 ENCOUNTER — Other Ambulatory Visit: Payer: No Typology Code available for payment source

## 2022-07-05 ENCOUNTER — Ambulatory Visit: Payer: No Typology Code available for payment source | Admitting: Internal Medicine

## 2022-07-11 ENCOUNTER — Other Ambulatory Visit: Payer: No Typology Code available for payment source

## 2022-07-12 ENCOUNTER — Other Ambulatory Visit: Payer: No Typology Code available for payment source

## 2022-07-12 ENCOUNTER — Ambulatory Visit: Payer: No Typology Code available for payment source

## 2022-07-12 ENCOUNTER — Ambulatory Visit: Payer: No Typology Code available for payment source | Admitting: Physician Assistant

## 2022-07-18 ENCOUNTER — Telehealth: Payer: Self-pay

## 2022-07-19 ENCOUNTER — Other Ambulatory Visit: Payer: No Typology Code available for payment source

## 2022-07-23 NOTE — Telephone Encounter (Signed)
Notification received from [facility] advising pt passed away this morning 07-22-22.

## 2022-07-23 DEATH — deceased

## 2022-07-26 ENCOUNTER — Other Ambulatory Visit: Payer: No Typology Code available for payment source

## 2022-08-02 ENCOUNTER — Ambulatory Visit: Payer: No Typology Code available for payment source | Admitting: Internal Medicine

## 2022-08-02 ENCOUNTER — Ambulatory Visit: Payer: No Typology Code available for payment source

## 2022-08-02 ENCOUNTER — Other Ambulatory Visit: Payer: No Typology Code available for payment source

## 2022-08-14 IMAGING — CT CT ABD-PELV W/ CM
3 of 6 series · 14 of 36 positions shown, 15 images · IV contrast (agent unspecified)
Comparison: 01/06/2022

CLINICAL DATA: Small-cell lung cancer. Restaging. * Tracking Code:
BO *

EXAM:
CT CHEST, ABDOMEN, AND PELVIS WITH CONTRAST
TECHNIQUE: Multidetector CT imaging of the chest, abdomen and pelvis was
performed following the standard protocol during bolus
administration of intravenous contrast.

[Series 2: cap with · axial · 0.85mm/px · z∈[-598,-168]mm · 4 of 144 slices shown, 5 images (1 of 2)]
[im 29/144  mediastinal]
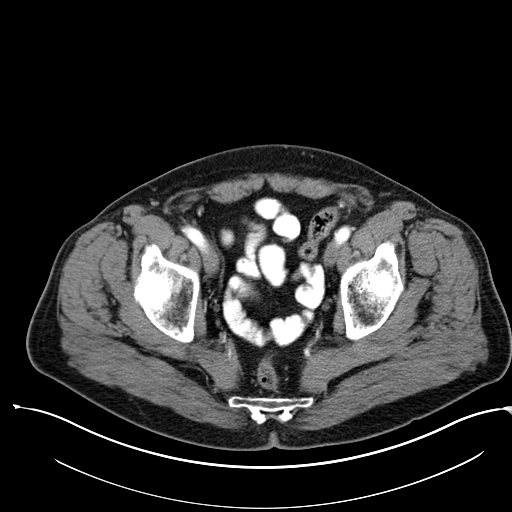
[im 29/144  lung]
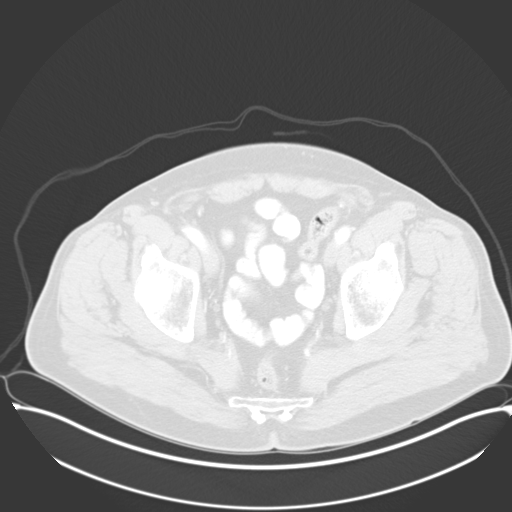
[im 58/144  lung]
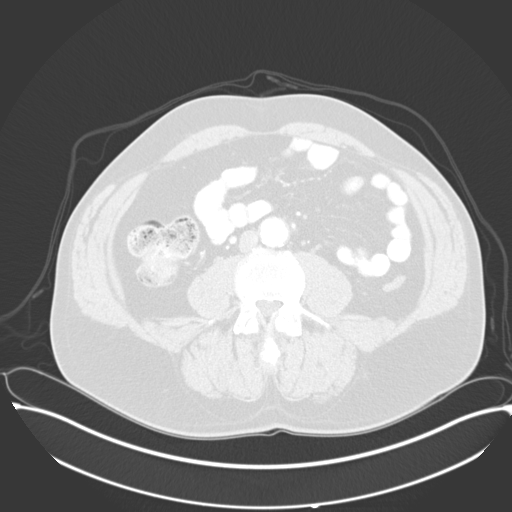
[im 86/144  lung]
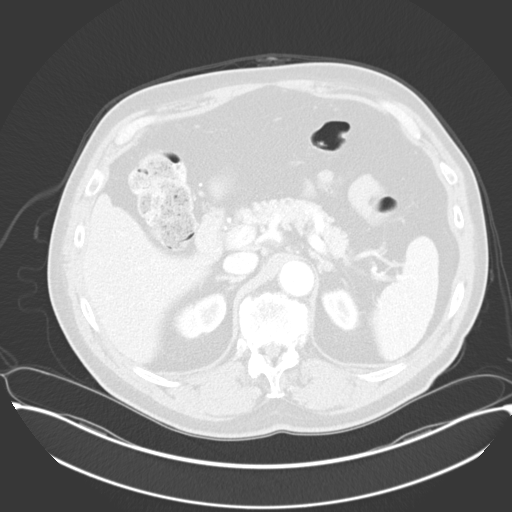
[im 115/144  lung]
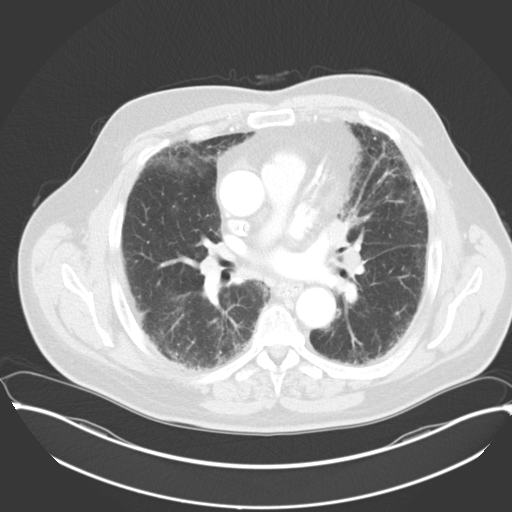

[Series 5: coronals · coronal · 1.01mm/px · 3 of 165 slices shown]
[im 33/165  lung]
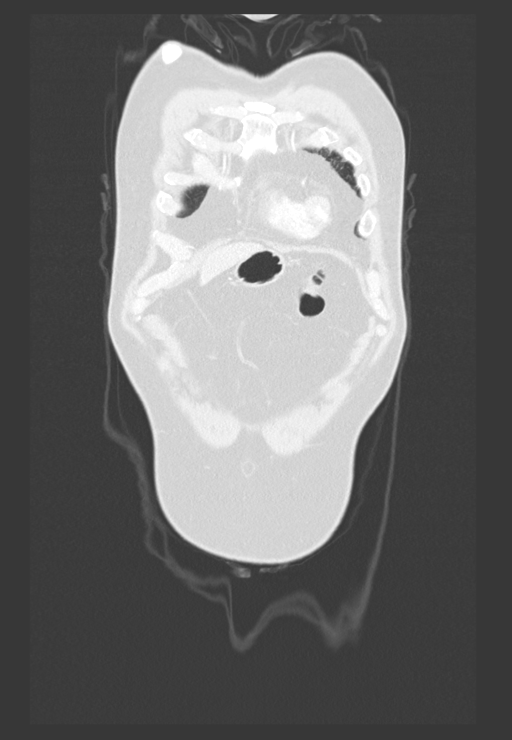
[im 66/165  lung]
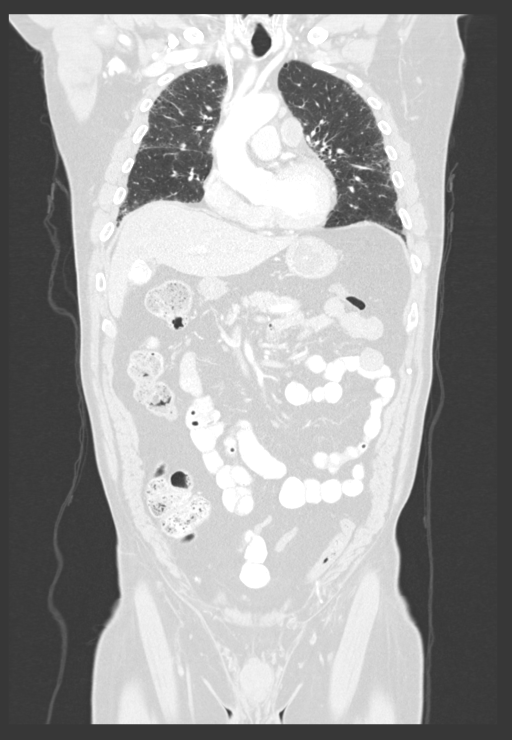
[im 99/165  lung]
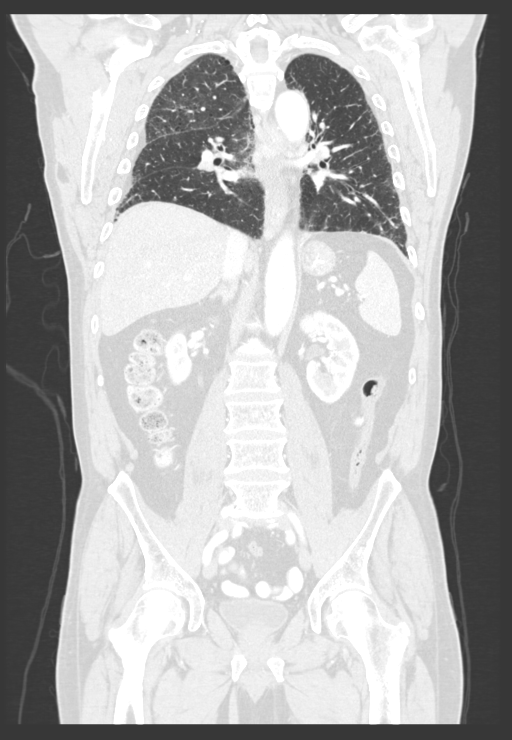

[Series 8: cap with · axial · 0.85mm/px · z∈[-303,-83]mm · 7 of 429 slices shown (2 of 2)]
[im 29/429  lung]
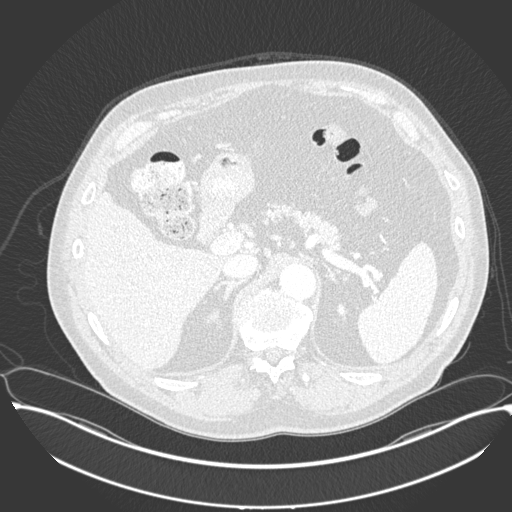
[im 86/429  lung]
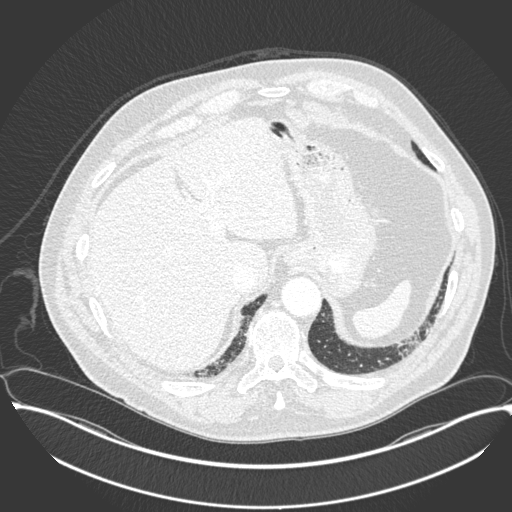
[im 143/429  lung]
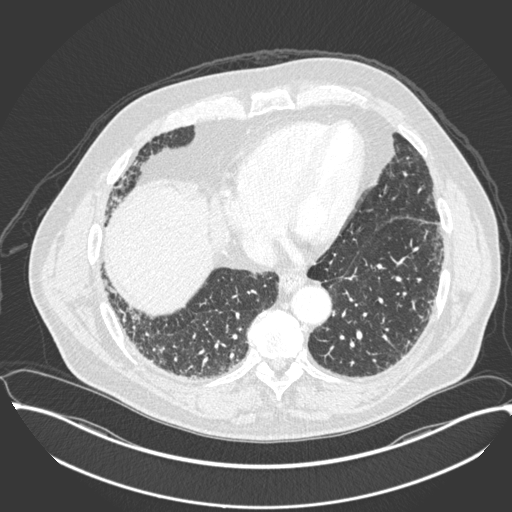
[im 200/429  lung]
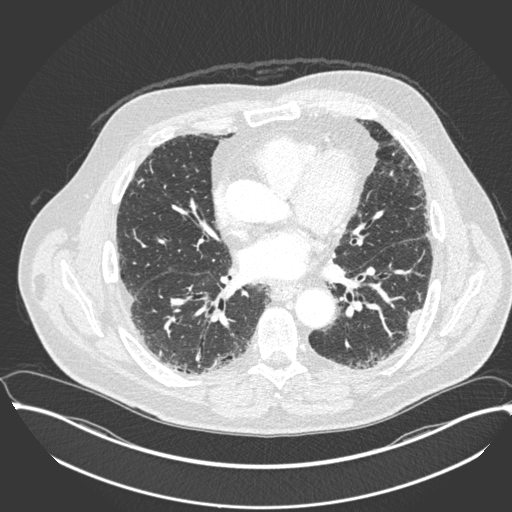
[im 229/429  lung]
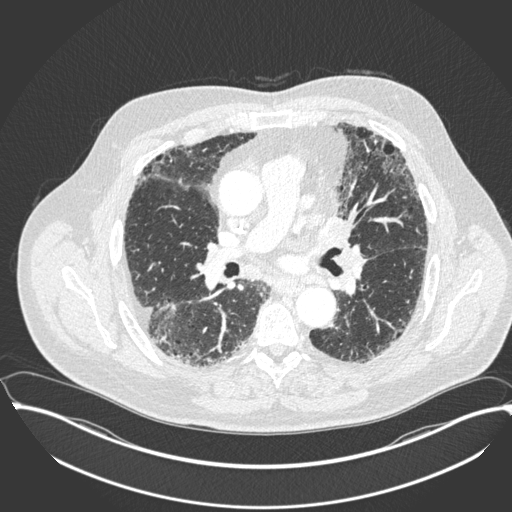
[im 286/429  lung]
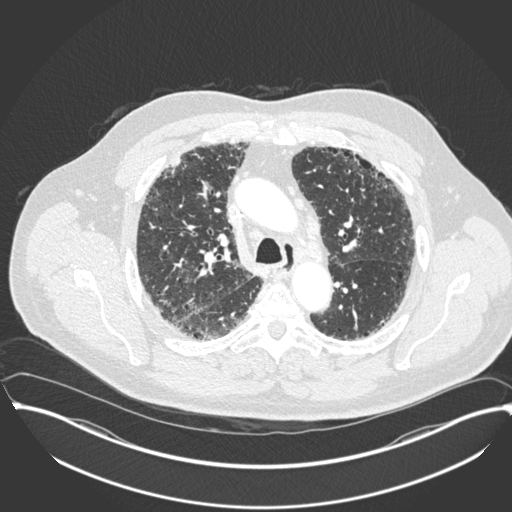
[im 343/429  lung]
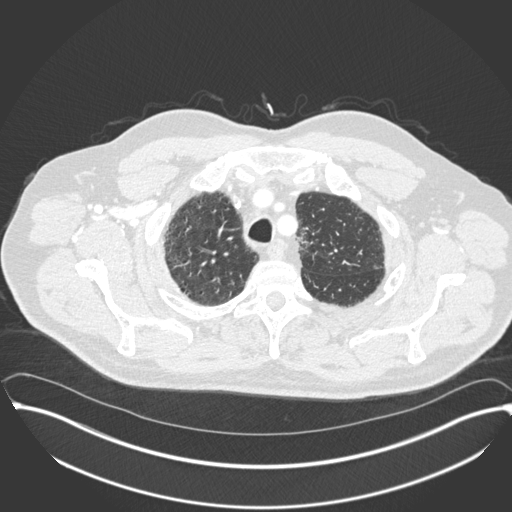

[14 of 36 positions shown; findings below may reference images not displayed]

RADIATION DOSE REDUCTION: This exam was performed according to the
departmental dose-optimization program which includes automated
exposure control, adjustment of the mA and/or kV according to
patient size and/or use of iterative reconstruction technique.

CONTRAST:  100mL OMNIPAQUE IOHEXOL 300 MG/ML  SOLN
FINDINGS: CT CHEST FINDINGS

Cardiovascular: The heart size is normal. No substantial pericardial
effusion. There is mild atherosclerotic calcification of the
abdominal aorta without aneurysm. Right Port-A-Cath tip is
positioned in the mid SVC. Mass-effect from worsening left hilar
lymphadenopathy attenuates pulmonary arterial and venous anatomy in
the left hilum. Given the central necrosis in this lymphadenopathy,
the appearance mimics pulmonary embolus although no definite filling
defects can be identified in the pulmonary arterial anatomy when
reviewing coronal and sagittal reformations.

Mediastinum/Nodes: Interval progression of left hilar
lymphadenopathy. 12 mm anterior left hilar lymph node seen
previously is now 23 mm short axis with central necrosis. Clustered
necrotic lymphadenopathy in the left hilum attenuates pulmonary
arterial anatomy and the superior left pulmonary vein. This hilar
lymphadenopathy is progressive since prior with 14 mm short axis
left hilar node on [DATE] increased from 7 mm previously (remeasured).
Precarinal and subcarinal mild lymphadenopathy is similar to prior.
Small lymph nodes are seen in the right hilum. The esophagus has
normal imaging features. There is no axillary lymphadenopathy.

Lungs/Pleura: Subpleural reticulation noted in both lungs compatible
with underlying pulmonary fibrosis. 9 mm subpleural left lower lobe
nodule identified previously is now 22 mm on image 82/4.

Subpleural anterior right upper lobe index nodule measured
previously at 9 x 8 mm is similar at 8 mm today (52/4). No new
suspicious pulmonary nodule or mass. No pleural effusion.

Musculoskeletal: No worrisome lytic or sclerotic osseous
abnormality.

CT ABDOMEN PELVIS FINDINGS

Hepatobiliary: Calcified gallstone evident. No suspicious focal
abnormality within the liver parenchyma. There is no evidence for
gallstones, gallbladder wall thickening, or pericholecystic fluid.

Pancreas: No focal mass lesion. No dilatation of the main duct. No
intraparenchymal cyst. No peripancreatic edema.

Spleen: No splenomegaly. No focal mass lesion.

Adrenals/Urinary Tract: No adrenal nodule or mass. Tiny cyst noted
right kidney. Left kidney unremarkable. No evidence for hydroureter.
The urinary bladder appears normal for the degree of distention.

Stomach/Bowel: Stomach is unremarkable. No gastric wall thickening.
No evidence of outlet obstruction. Duodenum is normally positioned
as is the ligament of Treitz. No small bowel wall thickening. No
small bowel dilatation. The terminal ileum is normal. The appendix
is not well visualized, but there is no edema or inflammation in the
region of the cecum. No gross colonic mass. No colonic wall
thickening.

Vascular/Lymphatic: There is mild atherosclerotic calcification of
the abdominal aorta without aneurysm. There is no gastrohepatic or
hepatoduodenal ligament lymphadenopathy. No retroperitoneal or
mesenteric lymphadenopathy. No pelvic sidewall lymphadenopathy.

Reproductive: The prostate gland and seminal vesicles are
unremarkable.

Other: No intraperitoneal free fluid.

Musculoskeletal: No worrisome lytic or sclerotic osseous
abnormality.
IMPRESSION: 1. Interval progression of left hilar lymphadenopathy with central
necrosis. Mass-effect from the lymphadenopathy attenuates pulmonary
arterial and venous anatomy in the left hilum.
2. Similar appearance of mild mediastinal lymphadenopathy.
3. Interval progression of subpleural left lower lobe pulmonary
nodule, now measuring 22 mm. This represents the hypermetabolic
nodule seen on PET imaging 06/10/2021.
4. Stable appearance of index right upper lobe pulmonary nodule.
5. No evidence for metastatic disease in the abdomen or pelvis.
6. Cholelithiasis.
7. Aortic Atherosclerosis ([id number]).
